# Patient Record
Sex: Female | Born: 1950 | Race: White | Hispanic: No | Marital: Married | Smoking: Former smoker
Health system: Southern US, Community
[De-identification: ages and names within clinical notes are randomized; demographics above are authoritative.]

## PROBLEM LIST (undated history)

## (undated) DIAGNOSIS — J189 Pneumonia, unspecified organism: Secondary | ICD-10-CM

## (undated) DIAGNOSIS — J449 Chronic obstructive pulmonary disease, unspecified: Secondary | ICD-10-CM

## (undated) DIAGNOSIS — E7800 Pure hypercholesterolemia, unspecified: Secondary | ICD-10-CM

---

## 1998-02-27 ENCOUNTER — Other Ambulatory Visit: Admission: RE | Admit: 1998-02-27 | Discharge: 1998-02-27 | Payer: Self-pay | Admitting: Obstetrics & Gynecology

## 1999-06-13 ENCOUNTER — Other Ambulatory Visit: Admission: RE | Admit: 1999-06-13 | Discharge: 1999-06-13 | Payer: Self-pay | Admitting: Obstetrics & Gynecology

## 2000-07-28 ENCOUNTER — Other Ambulatory Visit: Admission: RE | Admit: 2000-07-28 | Discharge: 2000-07-28 | Payer: Self-pay | Admitting: Obstetrics & Gynecology

## 2001-08-03 ENCOUNTER — Other Ambulatory Visit: Admission: RE | Admit: 2001-08-03 | Discharge: 2001-08-03 | Payer: Self-pay | Admitting: Obstetrics & Gynecology

## 2002-11-27 ENCOUNTER — Encounter: Payer: Self-pay | Admitting: Family Medicine

## 2002-11-27 ENCOUNTER — Encounter: Admission: RE | Admit: 2002-11-27 | Discharge: 2002-11-27 | Payer: Self-pay | Admitting: Family Medicine

## 2003-01-07 ENCOUNTER — Emergency Department (HOSPITAL_COMMUNITY): Admission: EM | Admit: 2003-01-07 | Discharge: 2003-01-07 | Payer: Self-pay

## 2003-02-26 ENCOUNTER — Other Ambulatory Visit: Admission: RE | Admit: 2003-02-26 | Discharge: 2003-02-26 | Payer: Self-pay | Admitting: Obstetrics & Gynecology

## 2004-09-05 ENCOUNTER — Other Ambulatory Visit: Admission: RE | Admit: 2004-09-05 | Discharge: 2004-09-05 | Payer: Self-pay | Admitting: Obstetrics & Gynecology

## 2005-01-02 ENCOUNTER — Ambulatory Visit: Payer: Self-pay | Admitting: Internal Medicine

## 2006-05-10 ENCOUNTER — Emergency Department (HOSPITAL_COMMUNITY): Admission: EM | Admit: 2006-05-10 | Discharge: 2006-05-10 | Payer: Self-pay | Admitting: Emergency Medicine

## 2006-05-13 ENCOUNTER — Ambulatory Visit: Payer: Self-pay | Admitting: Internal Medicine

## 2007-02-01 ENCOUNTER — Emergency Department (HOSPITAL_COMMUNITY): Admission: EM | Admit: 2007-02-01 | Discharge: 2007-02-01 | Payer: Self-pay | Admitting: Emergency Medicine

## 2011-12-30 ENCOUNTER — Encounter (INDEPENDENT_AMBULATORY_CARE_PROVIDER_SITE_OTHER): Payer: BC Managed Care – PPO | Admitting: *Deleted

## 2011-12-30 DIAGNOSIS — M79609 Pain in unspecified limb: Secondary | ICD-10-CM

## 2012-12-28 ENCOUNTER — Other Ambulatory Visit: Payer: Self-pay | Admitting: Orthopedic Surgery

## 2012-12-28 DIAGNOSIS — M8580 Other specified disorders of bone density and structure, unspecified site: Secondary | ICD-10-CM

## 2013-01-06 ENCOUNTER — Ambulatory Visit
Admission: RE | Admit: 2013-01-06 | Discharge: 2013-01-06 | Disposition: A | Discharge status: Home / Self Care | Payer: BC Managed Care – PPO | Source: Ambulatory Visit | Attending: Orthopedic Surgery | Admitting: Orthopedic Surgery

## 2013-01-06 DIAGNOSIS — M8580 Other specified disorders of bone density and structure, unspecified site: Secondary | ICD-10-CM

## 2014-07-31 ENCOUNTER — Other Ambulatory Visit: Payer: Self-pay | Admitting: Family Medicine

## 2014-07-31 ENCOUNTER — Ambulatory Visit
Admission: RE | Admit: 2014-07-31 | Discharge: 2014-07-31 | Disposition: A | Discharge status: Home / Self Care | Payer: BC Managed Care – PPO | Source: Ambulatory Visit | Attending: Family Medicine | Admitting: Family Medicine

## 2014-07-31 DIAGNOSIS — R52 Pain, unspecified: Secondary | ICD-10-CM

## 2014-07-31 DIAGNOSIS — M25551 Pain in right hip: Secondary | ICD-10-CM

## 2017-05-06 DIAGNOSIS — L989 Disorder of the skin and subcutaneous tissue, unspecified: Secondary | ICD-10-CM | POA: Diagnosis not present

## 2017-05-06 DIAGNOSIS — R8290 Unspecified abnormal findings in urine: Secondary | ICD-10-CM | POA: Diagnosis not present

## 2017-05-06 DIAGNOSIS — Z86010 Personal history of colonic polyps: Secondary | ICD-10-CM | POA: Diagnosis not present

## 2017-05-06 DIAGNOSIS — Z79899 Other long term (current) drug therapy: Secondary | ICD-10-CM | POA: Diagnosis not present

## 2017-05-06 DIAGNOSIS — R7301 Impaired fasting glucose: Secondary | ICD-10-CM | POA: Diagnosis not present

## 2017-05-06 DIAGNOSIS — Z23 Encounter for immunization: Secondary | ICD-10-CM | POA: Diagnosis not present

## 2017-05-06 DIAGNOSIS — E559 Vitamin D deficiency, unspecified: Secondary | ICD-10-CM | POA: Diagnosis not present

## 2017-05-06 DIAGNOSIS — J449 Chronic obstructive pulmonary disease, unspecified: Secondary | ICD-10-CM | POA: Diagnosis not present

## 2017-05-06 DIAGNOSIS — R918 Other nonspecific abnormal finding of lung field: Secondary | ICD-10-CM | POA: Diagnosis not present

## 2017-05-06 DIAGNOSIS — M810 Age-related osteoporosis without current pathological fracture: Secondary | ICD-10-CM | POA: Diagnosis not present

## 2017-05-06 DIAGNOSIS — Z0000 Encounter for general adult medical examination without abnormal findings: Secondary | ICD-10-CM | POA: Diagnosis not present

## 2017-05-06 DIAGNOSIS — E785 Hyperlipidemia, unspecified: Secondary | ICD-10-CM | POA: Diagnosis not present

## 2017-07-09 DIAGNOSIS — L719 Rosacea, unspecified: Secondary | ICD-10-CM | POA: Diagnosis not present

## 2017-07-09 DIAGNOSIS — L821 Other seborrheic keratosis: Secondary | ICD-10-CM | POA: Diagnosis not present

## 2017-07-09 DIAGNOSIS — D2230 Melanocytic nevi of unspecified part of face: Secondary | ICD-10-CM | POA: Diagnosis not present

## 2017-07-09 DIAGNOSIS — D485 Neoplasm of uncertain behavior of skin: Secondary | ICD-10-CM | POA: Diagnosis not present

## 2017-07-09 DIAGNOSIS — Q828 Other specified congenital malformations of skin: Secondary | ICD-10-CM | POA: Diagnosis not present

## 2017-07-09 DIAGNOSIS — D1800 Hemangioma unspecified site: Secondary | ICD-10-CM | POA: Diagnosis not present

## 2017-07-09 DIAGNOSIS — D216 Benign neoplasm of connective and other soft tissue of trunk, unspecified: Secondary | ICD-10-CM | POA: Diagnosis not present

## 2017-07-09 DIAGNOSIS — L820 Inflamed seborrheic keratosis: Secondary | ICD-10-CM | POA: Diagnosis not present

## 2017-07-09 DIAGNOSIS — D220 Melanocytic nevi of lip: Secondary | ICD-10-CM | POA: Diagnosis not present

## 2017-07-09 DIAGNOSIS — Z23 Encounter for immunization: Secondary | ICD-10-CM | POA: Diagnosis not present

## 2017-09-07 DIAGNOSIS — L570 Actinic keratosis: Secondary | ICD-10-CM | POA: Diagnosis not present

## 2017-09-07 DIAGNOSIS — Z23 Encounter for immunization: Secondary | ICD-10-CM | POA: Diagnosis not present

## 2017-09-07 DIAGNOSIS — L821 Other seborrheic keratosis: Secondary | ICD-10-CM | POA: Diagnosis not present

## 2017-09-07 DIAGNOSIS — L719 Rosacea, unspecified: Secondary | ICD-10-CM | POA: Diagnosis not present

## 2017-12-09 DIAGNOSIS — Z23 Encounter for immunization: Secondary | ICD-10-CM | POA: Diagnosis not present

## 2017-12-09 DIAGNOSIS — D220 Melanocytic nevi of lip: Secondary | ICD-10-CM | POA: Diagnosis not present

## 2017-12-09 DIAGNOSIS — D2371 Other benign neoplasm of skin of right lower limb, including hip: Secondary | ICD-10-CM | POA: Diagnosis not present

## 2017-12-09 DIAGNOSIS — L570 Actinic keratosis: Secondary | ICD-10-CM | POA: Diagnosis not present

## 2017-12-09 DIAGNOSIS — D2271 Melanocytic nevi of right lower limb, including hip: Secondary | ICD-10-CM | POA: Diagnosis not present

## 2017-12-09 DIAGNOSIS — Q828 Other specified congenital malformations of skin: Secondary | ICD-10-CM | POA: Diagnosis not present

## 2017-12-09 DIAGNOSIS — D2230 Melanocytic nevi of unspecified part of face: Secondary | ICD-10-CM | POA: Diagnosis not present

## 2018-05-27 DIAGNOSIS — J449 Chronic obstructive pulmonary disease, unspecified: Secondary | ICD-10-CM | POA: Diagnosis not present

## 2018-05-27 DIAGNOSIS — F17200 Nicotine dependence, unspecified, uncomplicated: Secondary | ICD-10-CM | POA: Diagnosis not present

## 2018-05-27 DIAGNOSIS — Z79899 Other long term (current) drug therapy: Secondary | ICD-10-CM | POA: Diagnosis not present

## 2018-05-27 DIAGNOSIS — Z86010 Personal history of colonic polyps: Secondary | ICD-10-CM | POA: Diagnosis not present

## 2018-05-27 DIAGNOSIS — R7301 Impaired fasting glucose: Secondary | ICD-10-CM | POA: Diagnosis not present

## 2018-05-27 DIAGNOSIS — R918 Other nonspecific abnormal finding of lung field: Secondary | ICD-10-CM | POA: Diagnosis not present

## 2018-05-27 DIAGNOSIS — M810 Age-related osteoporosis without current pathological fracture: Secondary | ICD-10-CM | POA: Diagnosis not present

## 2018-05-27 DIAGNOSIS — Z0000 Encounter for general adult medical examination without abnormal findings: Secondary | ICD-10-CM | POA: Diagnosis not present

## 2018-05-27 DIAGNOSIS — Z1389 Encounter for screening for other disorder: Secondary | ICD-10-CM | POA: Diagnosis not present

## 2018-05-27 DIAGNOSIS — E785 Hyperlipidemia, unspecified: Secondary | ICD-10-CM | POA: Diagnosis not present

## 2018-05-27 DIAGNOSIS — R8290 Unspecified abnormal findings in urine: Secondary | ICD-10-CM | POA: Diagnosis not present

## 2018-05-27 DIAGNOSIS — M899 Disorder of bone, unspecified: Secondary | ICD-10-CM | POA: Diagnosis not present

## 2018-05-27 DIAGNOSIS — R5383 Other fatigue: Secondary | ICD-10-CM | POA: Diagnosis not present

## 2018-05-27 DIAGNOSIS — Z23 Encounter for immunization: Secondary | ICD-10-CM | POA: Diagnosis not present

## 2018-06-03 ENCOUNTER — Other Ambulatory Visit: Payer: Self-pay | Admitting: Family Medicine

## 2018-06-03 DIAGNOSIS — R918 Other nonspecific abnormal finding of lung field: Secondary | ICD-10-CM

## 2018-06-10 ENCOUNTER — Ambulatory Visit
Admission: RE | Admit: 2018-06-10 | Discharge: 2018-06-10 | Disposition: A | Discharge status: Home / Self Care | Payer: Medicare Other | Source: Ambulatory Visit | Attending: Family Medicine | Admitting: Family Medicine

## 2018-06-10 DIAGNOSIS — R918 Other nonspecific abnormal finding of lung field: Secondary | ICD-10-CM | POA: Diagnosis not present

## 2018-06-10 MED ORDER — IOPAMIDOL (ISOVUE-300) INJECTION 61%
75.0000 mL | Freq: Once | INTRAVENOUS | Status: AC | PRN
  Administered 2018-06-10: 75 mL via INTRAVENOUS

## 2018-06-24 DIAGNOSIS — J449 Chronic obstructive pulmonary disease, unspecified: Secondary | ICD-10-CM | POA: Diagnosis not present

## 2018-06-24 DIAGNOSIS — M810 Age-related osteoporosis without current pathological fracture: Secondary | ICD-10-CM | POA: Diagnosis not present

## 2018-07-14 ENCOUNTER — Telehealth: Payer: Self-pay | Admitting: Neurology

## 2018-07-14 NOTE — Telephone Encounter (Signed)
Error

## 2018-08-18 DIAGNOSIS — M810 Age-related osteoporosis without current pathological fracture: Secondary | ICD-10-CM | POA: Diagnosis not present

## 2018-09-02 DIAGNOSIS — R000 Tachycardia, unspecified: Secondary | ICD-10-CM | POA: Diagnosis not present

## 2018-09-02 DIAGNOSIS — E569 Vitamin deficiency, unspecified: Secondary | ICD-10-CM | POA: Diagnosis not present

## 2018-09-27 ENCOUNTER — Ambulatory Visit (INDEPENDENT_AMBULATORY_CARE_PROVIDER_SITE_OTHER): Payer: Medicare Other | Admitting: Cardiovascular Disease

## 2018-09-27 ENCOUNTER — Encounter: Payer: Self-pay | Admitting: Cardiovascular Disease

## 2018-09-27 DIAGNOSIS — R002 Palpitations: Secondary | ICD-10-CM | POA: Diagnosis not present

## 2018-09-27 DIAGNOSIS — Z720 Tobacco use: Secondary | ICD-10-CM | POA: Diagnosis not present

## 2018-09-27 DIAGNOSIS — J410 Simple chronic bronchitis: Secondary | ICD-10-CM | POA: Diagnosis not present

## 2018-09-27 DIAGNOSIS — E782 Mixed hyperlipidemia: Secondary | ICD-10-CM | POA: Diagnosis not present

## 2018-09-27 DIAGNOSIS — E785 Hyperlipidemia, unspecified: Secondary | ICD-10-CM | POA: Insufficient documentation

## 2018-09-27 DIAGNOSIS — J449 Chronic obstructive pulmonary disease, unspecified: Secondary | ICD-10-CM | POA: Insufficient documentation

## 2018-09-27 NOTE — Assessment & Plan Note (Signed)
Ms. Oswald was referred to me by Jillyn Ledger FNP for tachypalpitations.  She had the first episode 3 months ago and one subsequent episode since that time lasting 5 to 10 minutes without other associated symptoms.  She does drink 2 to 3 cups of coffee a day.  I am going get a 2D echo and a 30-day event monitor to further evaluate.

## 2018-09-27 NOTE — Addendum Note (Signed)
Addended by: Annita Brod on: 09/27/2018 02:58 PM   Modules accepted: Orders

## 2018-09-27 NOTE — Progress Notes (Signed)
09/27/2018 Jordan Clay   May 28, 1951  [id number]  Primary Physician Hulan Fess, MD Primary Cardiologist: Lorretta Harp MD Lupe Carney, [place]  HPI:  Jordan Clay is a 67 y.o. thin appearing married Caucasian female mother of 2, grandmother for grandchildren whose husband Obie Dredge was also a patient of mine.  She was referred to me by Jillyn Ledger FNP for tachypalpitations.  Is a history of treated hyper lipidemia.  Is no family history other than a father who died at a late age of an myocardial infarction.  She does smoke three quarters a pack a day for last 45 years recalcitrant risk factor modification.  She never had a heart attack or stroke.  She said 2 episodes of tachypalpitations, the first being 3 months ago and one subsequent to that lasting 5 to 10 minutes at a time without associated symptoms.  She does drink 2 to 3 cups of caffeine a day.   No outpatient medications have been marked as taking for the 09/27/18 encounter (Office Visit) with Lorretta Harp, MD.     Not on File  Social History   Socioeconomic History  . Marital status: Married    Spouse name: Not on file  . Number of children: Not on file  . Years of education: Not on file  . Highest education level: Not on file  Occupational History  . Not on file  Social Needs  . Financial resource strain: Not on file  . Food insecurity:    Worry: Not on file    Inability: Not on file  . Transportation needs:    Medical: Not on file    Non-medical: Not on file  Tobacco Use  . Smoking status: Current Every Day Smoker  . Smokeless tobacco: Never Used  Substance and Sexual Activity  . Alcohol use: Not on file  . Drug use: Not on file  . Sexual activity: Not on file  Lifestyle  . Physical activity:    Days per week: Not on file    Minutes per session: Not on file  . Stress: Not on file  Relationships  . Social connections:    Talks on phone: Not on file    Gets together: Not on file   Attends religious service: Not on file    Active member of club or organization: Not on file    Attends meetings of clubs or organizations: Not on file    Relationship status: Not on file  . Intimate partner violence:    Fear of current or ex partner: Not on file    Emotionally abused: Not on file    Physically abused: Not on file    Forced sexual activity: Not on file  Other Topics Concern  . Not on file  Social History Narrative  . Not on file     Review of Systems: General: negative for chills, fever, night sweats or weight changes.  Cardiovascular: negative for chest pain, dyspnea on exertion, edema, orthopnea, palpitations, paroxysmal nocturnal dyspnea or shortness of breath Dermatological: negative for rash Respiratory: negative for cough or wheezing Urologic: negative for hematuria Abdominal: negative for nausea, vomiting, diarrhea, bright red blood per rectum, melena, or hematemesis Neurologic: negative for visual changes, syncope, or dizziness All other systems reviewed and are otherwise negative except as noted above.    Blood pressure 118/62, pulse 70, height 5' 8.5" (1.74 m), weight 148 lb 12.8 oz (67.5 kg).  General appearance: alert and no distress Neck:  no adenopathy, no carotid bruit, no JVD, supple, symmetrical, trachea midline and thyroid not enlarged, symmetric, no tenderness/mass/nodules Lungs: clear to auscultation bilaterally Heart: regular rate and rhythm, S1, S2 normal, no murmur, click, rub or gallop Extremities: extremities normal, atraumatic, no cyanosis or edema Pulses: 2+ and symmetric Skin: Skin color, texture, turgor normal. No rashes or lesions Neurologic: Alert and oriented X 3, normal strength and tone. Normal symmetric reflexes. Normal coordination and gait  EKG sinus rhythm at 70 without ST or T wave changes.  I Personally reviewed this EKG.  ASSESSMENT AND PLAN:   Hyperlipidemia History of hyperlipidemia on statin therapy with a lipid  profile performed 05/27/2018 revealing total cholesterol 161, LDL 92 and HDL 51  Tobacco abuse History of 40-pack-year tobacco abuse currently smoking three quarters of a pack a day recalcitrant risk factor modification.  Palpitations Ms. Pohlman was referred to me by Jillyn Ledger FNP for tachypalpitations.  She had the first episode 3 months ago and one subsequent episode since that time lasting 5 to 10 minutes without other associated symptoms.  She does drink 2 to 3 cups of coffee a day.  I am going get a 2D echo and a 30-day event monitor to further evaluate.      Lorretta Harp MD FACP,FACC,FAHA, [facility] 09/27/2018 2:53 PM

## 2018-09-27 NOTE — Assessment & Plan Note (Signed)
History of hyperlipidemia on statin therapy with a lipid profile performed 05/27/2018 revealing total cholesterol 161, LDL 92 and HDL 51

## 2018-09-27 NOTE — Patient Instructions (Signed)
Medication Instructions:  NONE If you need a refill on your cardiac medications before your next appointment, please call your pharmacy.   Lab work: NONE If you have labs (blood work) drawn today and your tests are completely normal, you will receive your results only by: Marland Kitchen MyChart Message (if you have MyChart) OR . A paper copy in the mail If you have any lab test that is abnormal or we need to change your treatment, we will call you to review the results.  Testing/Procedures: Your physician has requested that you have an echocardiogram. Echocardiography is a painless test that uses sound waves to create images of your heart. It provides your doctor with information about the size and shape of your heart and how well your heart's chambers and valves are working. This procedure takes approximately one hour. There are no restrictions for this procedure.  Your physician has recommended that you wear a 30-DAY event monitor. Event monitors are medical devices that record the heart's electrical activity. Doctors most often [place] these monitors to diagnose arrhythmias. Arrhythmias are problems with the speed or rhythm of the heartbeat. The monitor is a small, portable device. You can wear one while you do your normal daily activities. This is usually used to diagnose what is causing palpitations/syncope (passing out).   Follow-Up: At [facility], you and your health needs are our priority.  As part of our continuing mission to provide you with exceptional heart care, we have created designated Provider Care Teams.  These Care Teams include your primary Cardiologist (physician) and Advanced Practice Providers (APPs -  Physician Assistants and Nurse Practitioners) who all work together to provide you with the care you need, when you need it.  You will need a follow up appointment in 3 months WITH DR. Gwenlyn Found.

## 2018-09-27 NOTE — Assessment & Plan Note (Signed)
History of 40-pack-year tobacco abuse currently smoking three quarters of a pack a day recalcitrant risk factor modification.

## 2018-10-05 ENCOUNTER — Ambulatory Visit (INDEPENDENT_AMBULATORY_CARE_PROVIDER_SITE_OTHER): Payer: Medicare Other

## 2018-10-05 ENCOUNTER — Other Ambulatory Visit: Payer: Self-pay

## 2018-10-05 ENCOUNTER — Ambulatory Visit (HOSPITAL_COMMUNITY): Payer: Medicare Other | Attending: Cardiology

## 2018-10-05 DIAGNOSIS — I5189 Other ill-defined heart diseases: Secondary | ICD-10-CM | POA: Insufficient documentation

## 2018-10-05 DIAGNOSIS — F17200 Nicotine dependence, unspecified, uncomplicated: Secondary | ICD-10-CM | POA: Insufficient documentation

## 2018-10-05 DIAGNOSIS — Z8249 Family history of ischemic heart disease and other diseases of the circulatory system: Secondary | ICD-10-CM | POA: Diagnosis not present

## 2018-10-05 DIAGNOSIS — R002 Palpitations: Secondary | ICD-10-CM | POA: Diagnosis not present

## 2018-10-05 DIAGNOSIS — E785 Hyperlipidemia, unspecified: Secondary | ICD-10-CM | POA: Diagnosis not present

## 2018-10-10 ENCOUNTER — Other Ambulatory Visit (HOSPITAL_COMMUNITY): Payer: Medicare Other

## 2018-12-12 DIAGNOSIS — H52201 Unspecified astigmatism, right eye: Secondary | ICD-10-CM | POA: Diagnosis not present

## 2018-12-12 DIAGNOSIS — H25813 Combined forms of age-related cataract, bilateral: Secondary | ICD-10-CM | POA: Diagnosis not present

## 2018-12-12 DIAGNOSIS — H524 Presbyopia: Secondary | ICD-10-CM | POA: Diagnosis not present

## 2018-12-16 DIAGNOSIS — M79605 Pain in left leg: Secondary | ICD-10-CM | POA: Diagnosis not present

## 2019-01-13 ENCOUNTER — Ambulatory Visit: Payer: Medicare Other | Admitting: Cardiovascular Disease

## 2019-06-07 DIAGNOSIS — R1013 Epigastric pain: Secondary | ICD-10-CM | POA: Diagnosis not present

## 2019-07-31 ENCOUNTER — Other Ambulatory Visit: Payer: Self-pay

## 2019-07-31 DIAGNOSIS — R05 Cough: Secondary | ICD-10-CM | POA: Diagnosis not present

## 2019-07-31 DIAGNOSIS — Z87891 Personal history of nicotine dependence: Secondary | ICD-10-CM | POA: Diagnosis not present

## 2019-07-31 DIAGNOSIS — Z20822 Contact with and (suspected) exposure to covid-19: Secondary | ICD-10-CM

## 2019-07-31 DIAGNOSIS — R0781 Pleurodynia: Secondary | ICD-10-CM | POA: Diagnosis not present

## 2019-07-31 DIAGNOSIS — J449 Chronic obstructive pulmonary disease, unspecified: Secondary | ICD-10-CM | POA: Diagnosis not present

## 2019-08-01 LAB — NOVEL CORONAVIRUS, NAA: SARS-CoV-2, NAA: NOT DETECTED

## 2019-08-04 DIAGNOSIS — R05 Cough: Secondary | ICD-10-CM | POA: Diagnosis not present

## 2019-08-07 ENCOUNTER — Encounter (HOSPITAL_COMMUNITY): Payer: Self-pay

## 2019-08-07 ENCOUNTER — Emergency Department (HOSPITAL_COMMUNITY): Payer: Medicare Other

## 2019-08-07 ENCOUNTER — Other Ambulatory Visit: Payer: Self-pay

## 2019-08-07 ENCOUNTER — Inpatient Hospital Stay (HOSPITAL_COMMUNITY)
Admission: EM | Admit: 2019-08-07 | Discharge: 2019-08-31 | DRG: 166 | Disposition: A | Discharge status: Hospice | Payer: Medicare Other | Attending: Internal Medicine | Admitting: Internal Medicine

## 2019-08-07 DIAGNOSIS — R918 Other nonspecific abnormal finding of lung field: Secondary | ICD-10-CM | POA: Diagnosis not present

## 2019-08-07 DIAGNOSIS — J439 Emphysema, unspecified: Secondary | ICD-10-CM | POA: Diagnosis present

## 2019-08-07 DIAGNOSIS — R9431 Abnormal electrocardiogram [ECG] [EKG]: Secondary | ICD-10-CM | POA: Diagnosis present

## 2019-08-07 DIAGNOSIS — R059 Cough, unspecified: Secondary | ICD-10-CM

## 2019-08-07 DIAGNOSIS — R000 Tachycardia, unspecified: Secondary | ICD-10-CM | POA: Diagnosis not present

## 2019-08-07 DIAGNOSIS — C771 Secondary and unspecified malignant neoplasm of intrathoracic lymph nodes: Secondary | ICD-10-CM | POA: Diagnosis present

## 2019-08-07 DIAGNOSIS — Y92230 Patient room in hospital as the place of occurrence of the external cause: Secondary | ICD-10-CM | POA: Diagnosis not present

## 2019-08-07 DIAGNOSIS — J9600 Acute respiratory failure, unspecified whether with hypoxia or hypercapnia: Secondary | ICD-10-CM | POA: Diagnosis not present

## 2019-08-07 DIAGNOSIS — F17211 Nicotine dependence, cigarettes, in remission: Secondary | ICD-10-CM | POA: Diagnosis not present

## 2019-08-07 DIAGNOSIS — Z20828 Contact with and (suspected) exposure to other viral communicable diseases: Secondary | ICD-10-CM | POA: Diagnosis present

## 2019-08-07 DIAGNOSIS — G4700 Insomnia, unspecified: Secondary | ICD-10-CM | POA: Diagnosis present

## 2019-08-07 DIAGNOSIS — J90 Pleural effusion, not elsewhere classified: Secondary | ICD-10-CM | POA: Diagnosis present

## 2019-08-07 DIAGNOSIS — J189 Pneumonia, unspecified organism: Secondary | ICD-10-CM | POA: Diagnosis present

## 2019-08-07 DIAGNOSIS — Z4682 Encounter for fitting and adjustment of non-vascular catheter: Secondary | ICD-10-CM | POA: Diagnosis not present

## 2019-08-07 DIAGNOSIS — C3431 Malignant neoplasm of lower lobe, right bronchus or lung: Principal | ICD-10-CM | POA: Diagnosis present

## 2019-08-07 DIAGNOSIS — I4581 Long QT syndrome: Secondary | ICD-10-CM | POA: Diagnosis present

## 2019-08-07 DIAGNOSIS — Z9689 Presence of other specified functional implants: Secondary | ICD-10-CM | POA: Diagnosis not present

## 2019-08-07 DIAGNOSIS — T380X5A Adverse effect of glucocorticoids and synthetic analogues, initial encounter: Secondary | ICD-10-CM | POA: Diagnosis not present

## 2019-08-07 DIAGNOSIS — R739 Hyperglycemia, unspecified: Secondary | ICD-10-CM | POA: Diagnosis not present

## 2019-08-07 DIAGNOSIS — R05 Cough: Secondary | ICD-10-CM | POA: Diagnosis not present

## 2019-08-07 DIAGNOSIS — R0602 Shortness of breath: Secondary | ICD-10-CM | POA: Diagnosis not present

## 2019-08-07 DIAGNOSIS — C3490 Malignant neoplasm of unspecified part of unspecified bronchus or lung: Secondary | ICD-10-CM | POA: Diagnosis not present

## 2019-08-07 DIAGNOSIS — Z882 Allergy status to sulfonamides status: Secondary | ICD-10-CM

## 2019-08-07 DIAGNOSIS — J181 Lobar pneumonia, unspecified organism: Secondary | ICD-10-CM | POA: Diagnosis not present

## 2019-08-07 DIAGNOSIS — Z515 Encounter for palliative care: Secondary | ICD-10-CM | POA: Diagnosis not present

## 2019-08-07 DIAGNOSIS — J9601 Acute respiratory failure with hypoxia: Secondary | ICD-10-CM | POA: Diagnosis present

## 2019-08-07 DIAGNOSIS — Z9889 Other specified postprocedural states: Secondary | ICD-10-CM

## 2019-08-07 DIAGNOSIS — F05 Delirium due to known physiological condition: Secondary | ICD-10-CM | POA: Diagnosis not present

## 2019-08-07 DIAGNOSIS — C3491 Malignant neoplasm of unspecified part of right bronchus or lung: Secondary | ICD-10-CM | POA: Diagnosis not present

## 2019-08-07 DIAGNOSIS — J449 Chronic obstructive pulmonary disease, unspecified: Secondary | ICD-10-CM | POA: Diagnosis not present

## 2019-08-07 DIAGNOSIS — Z9981 Dependence on supplemental oxygen: Secondary | ICD-10-CM | POA: Diagnosis not present

## 2019-08-07 DIAGNOSIS — J910 Malignant pleural effusion: Secondary | ICD-10-CM | POA: Diagnosis present

## 2019-08-07 DIAGNOSIS — F418 Other specified anxiety disorders: Secondary | ICD-10-CM | POA: Diagnosis not present

## 2019-08-07 DIAGNOSIS — J984 Other disorders of lung: Secondary | ICD-10-CM | POA: Diagnosis not present

## 2019-08-07 DIAGNOSIS — K5900 Constipation, unspecified: Secondary | ICD-10-CM | POA: Diagnosis present

## 2019-08-07 DIAGNOSIS — E440 Moderate protein-calorie malnutrition: Secondary | ICD-10-CM | POA: Diagnosis not present

## 2019-08-07 DIAGNOSIS — I9589 Other hypotension: Secondary | ICD-10-CM | POA: Diagnosis present

## 2019-08-07 DIAGNOSIS — R404 Transient alteration of awareness: Secondary | ICD-10-CM | POA: Diagnosis not present

## 2019-08-07 DIAGNOSIS — J42 Unspecified chronic bronchitis: Secondary | ICD-10-CM | POA: Diagnosis not present

## 2019-08-07 DIAGNOSIS — Z7189 Other specified counseling: Secondary | ICD-10-CM

## 2019-08-07 DIAGNOSIS — K219 Gastro-esophageal reflux disease without esophagitis: Secondary | ICD-10-CM | POA: Diagnosis present

## 2019-08-07 DIAGNOSIS — C778 Secondary and unspecified malignant neoplasm of lymph nodes of multiple regions: Secondary | ICD-10-CM | POA: Diagnosis not present

## 2019-08-07 DIAGNOSIS — J9690 Respiratory failure, unspecified, unspecified whether with hypoxia or hypercapnia: Secondary | ICD-10-CM | POA: Diagnosis not present

## 2019-08-07 DIAGNOSIS — Z66 Do not resuscitate: Secondary | ICD-10-CM | POA: Diagnosis not present

## 2019-08-07 DIAGNOSIS — R1310 Dysphagia, unspecified: Secondary | ICD-10-CM | POA: Diagnosis not present

## 2019-08-07 DIAGNOSIS — F419 Anxiety disorder, unspecified: Secondary | ICD-10-CM | POA: Diagnosis not present

## 2019-08-07 DIAGNOSIS — J939 Pneumothorax, unspecified: Secondary | ICD-10-CM | POA: Diagnosis not present

## 2019-08-07 DIAGNOSIS — R9389 Abnormal findings on diagnostic imaging of other specified body structures: Secondary | ICD-10-CM | POA: Diagnosis not present

## 2019-08-07 DIAGNOSIS — C3481 Malignant neoplasm of overlapping sites of right bronchus and lung: Secondary | ICD-10-CM | POA: Diagnosis not present

## 2019-08-07 DIAGNOSIS — Z6825 Body mass index (BMI) 25.0-25.9, adult: Secondary | ICD-10-CM

## 2019-08-07 DIAGNOSIS — C799 Secondary malignant neoplasm of unspecified site: Secondary | ICD-10-CM | POA: Diagnosis not present

## 2019-08-07 DIAGNOSIS — Z79899 Other long term (current) drug therapy: Secondary | ICD-10-CM

## 2019-08-07 DIAGNOSIS — I471 Supraventricular tachycardia: Secondary | ICD-10-CM | POA: Diagnosis not present

## 2019-08-07 DIAGNOSIS — R339 Retention of urine, unspecified: Secondary | ICD-10-CM | POA: Diagnosis not present

## 2019-08-07 DIAGNOSIS — R159 Full incontinence of feces: Secondary | ICD-10-CM | POA: Diagnosis not present

## 2019-08-07 DIAGNOSIS — E785 Hyperlipidemia, unspecified: Secondary | ICD-10-CM | POA: Diagnosis present

## 2019-08-07 DIAGNOSIS — J410 Simple chronic bronchitis: Secondary | ICD-10-CM | POA: Diagnosis not present

## 2019-08-07 DIAGNOSIS — Z7401 Bed confinement status: Secondary | ICD-10-CM | POA: Diagnosis not present

## 2019-08-07 DIAGNOSIS — J441 Chronic obstructive pulmonary disease with (acute) exacerbation: Secondary | ICD-10-CM | POA: Diagnosis not present

## 2019-08-07 DIAGNOSIS — R21 Rash and other nonspecific skin eruption: Secondary | ICD-10-CM | POA: Diagnosis not present

## 2019-08-07 DIAGNOSIS — R64 Cachexia: Secondary | ICD-10-CM | POA: Diagnosis not present

## 2019-08-07 DIAGNOSIS — E43 Unspecified severe protein-calorie malnutrition: Secondary | ICD-10-CM | POA: Diagnosis not present

## 2019-08-07 DIAGNOSIS — Z8701 Personal history of pneumonia (recurrent): Secondary | ICD-10-CM | POA: Diagnosis not present

## 2019-08-07 DIAGNOSIS — Z881 Allergy status to other antibiotic agents status: Secondary | ICD-10-CM

## 2019-08-07 DIAGNOSIS — M2550 Pain in unspecified joint: Secondary | ICD-10-CM | POA: Diagnosis not present

## 2019-08-07 DIAGNOSIS — R0600 Dyspnea, unspecified: Secondary | ICD-10-CM | POA: Diagnosis not present

## 2019-08-07 DIAGNOSIS — F17210 Nicotine dependence, cigarettes, uncomplicated: Secondary | ICD-10-CM | POA: Diagnosis present

## 2019-08-07 DIAGNOSIS — R32 Unspecified urinary incontinence: Secondary | ICD-10-CM | POA: Diagnosis not present

## 2019-08-07 HISTORY — DX: Chronic obstructive pulmonary disease, unspecified: J44.9

## 2019-08-07 HISTORY — DX: Pure hypercholesterolemia, unspecified: E78.00

## 2019-08-07 HISTORY — DX: Pneumonia, unspecified organism: J18.9

## 2019-08-07 LAB — CBC WITH DIFFERENTIAL/PLATELET
Abs Immature Granulocytes: 0.11 10*3/uL — ABNORMAL HIGH (ref 0.00–0.07)
Basophils Absolute: 0.1 10*3/uL (ref 0.0–0.1)
Basophils Relative: 0 %
Eosinophils Absolute: 0.3 10*3/uL (ref 0.0–0.5)
Eosinophils Relative: 2 %
HCT: 45.4 % (ref 36.0–46.0)
Hemoglobin: 14.6 g/dL (ref 12.0–15.0)
Immature Granulocytes: 1 %
Lymphocytes Relative: 12 %
Lymphs Abs: 2.4 10*3/uL (ref 0.7–4.0)
MCH: 30 pg (ref 26.0–34.0)
MCHC: 32.2 g/dL (ref 30.0–36.0)
MCV: 93.2 fL (ref 80.0–100.0)
Monocytes Absolute: 1.3 10*3/uL — ABNORMAL HIGH (ref 0.1–1.0)
Monocytes Relative: 7 %
Neutro Abs: 15.1 10*3/uL — ABNORMAL HIGH (ref 1.7–7.7)
Neutrophils Relative %: 78 %
Platelets: 301 10*3/uL (ref 150–400)
RBC: 4.87 MIL/uL (ref 3.87–5.11)
RDW: 12.2 % (ref 11.5–15.5)
WBC: 19.3 10*3/uL — ABNORMAL HIGH (ref 4.0–10.5)
nRBC: 0 % (ref 0.0–0.2)

## 2019-08-07 LAB — COMPREHENSIVE METABOLIC PANEL
ALT: 29 U/L (ref 0–44)
AST: 23 U/L (ref 15–41)
Albumin: 2.8 g/dL — ABNORMAL LOW (ref 3.5–5.0)
Alkaline Phosphatase: 63 U/L (ref 38–126)
Anion gap: 12 (ref 5–15)
BUN: 15 mg/dL (ref 8–23)
CO2: 21 mmol/L — ABNORMAL LOW (ref 22–32)
Calcium: 8.4 mg/dL — ABNORMAL LOW (ref 8.9–10.3)
Chloride: 103 mmol/L (ref 98–111)
Creatinine, Ser: 0.97 mg/dL (ref 0.44–1.00)
GFR calc Af Amer: >60 mL/min (ref >60)
GFR calc non Af Amer: >60 mL/min (ref >60)
Glucose, Bld: 128 mg/dL — ABNORMAL HIGH (ref 70–99)
Potassium: 3.6 mmol/L (ref 3.5–5.1)
Sodium: 136 mmol/L (ref 135–145)
Total Bilirubin: 0.8 mg/dL (ref 0.3–1.2)
Total Protein: 5.9 g/dL — ABNORMAL LOW (ref 6.5–8.1)

## 2019-08-07 LAB — URINALYSIS, ROUTINE W REFLEX MICROSCOPIC
Bilirubin Urine: NEGATIVE
Glucose, UA: NEGATIVE mg/dL
Ketones, ur: 5 mg/dL — AB
Leukocytes,Ua: NEGATIVE
Nitrite: NEGATIVE
Protein, ur: NEGATIVE mg/dL
Specific Gravity, Urine: 1.016 (ref 1.005–1.030)
pH: 6 (ref 5.0–8.0)

## 2019-08-07 LAB — SARS CORONAVIRUS 2 (TAT 6-24 HRS): SARS Coronavirus 2: NEGATIVE

## 2019-08-07 LAB — LACTIC ACID, PLASMA: Lactic Acid, Venous: 1.8 mmol/L (ref 0.5–1.9)

## 2019-08-07 LAB — STREP PNEUMONIAE URINARY ANTIGEN: Strep Pneumo Urinary Antigen: NEGATIVE

## 2019-08-07 MED ORDER — SODIUM CHLORIDE 0.9 % IV SOLN
1.0000 g | Freq: Once | INTRAVENOUS | Status: AC
  Administered 2019-08-07: 17:00:00 1 g via INTRAVENOUS
  Filled 2019-08-07: qty 10

## 2019-08-07 MED ORDER — MORPHINE SULFATE (PF) 4 MG/ML IV SOLN
4.0000 mg | INTRAVENOUS | Status: DC | PRN
  Administered 2019-08-08 – 2019-08-12 (×14): 4 mg via INTRAVENOUS
  Filled 2019-08-07 (×14): qty 1

## 2019-08-07 MED ORDER — SODIUM CHLORIDE 0.9 % IV SOLN
500.0000 mg | INTRAVENOUS | Status: AC
  Administered 2019-08-08 – 2019-08-12 (×5): 500 mg via INTRAVENOUS
  Filled 2019-08-07 (×5): qty 500

## 2019-08-07 MED ORDER — AZITHROMYCIN 500 MG IV SOLR
500.0000 mg | Freq: Once | INTRAVENOUS | Status: AC
  Administered 2019-08-07: 19:00:00 500 mg via INTRAVENOUS
  Filled 2019-08-07: qty 500

## 2019-08-07 MED ORDER — ZOLPIDEM TARTRATE 5 MG PO TABS
5.0000 mg | ORAL_TABLET | Freq: Every evening | ORAL | Status: DC | PRN
  Administered 2019-08-07 – 2019-08-11 (×3): 5 mg via ORAL
  Filled 2019-08-07 (×5): qty 1

## 2019-08-07 MED ORDER — POTASSIUM CHLORIDE CRYS ER 20 MEQ PO TBCR
20.0000 meq | EXTENDED_RELEASE_TABLET | Freq: Once | ORAL | Status: AC
  Administered 2019-08-07: 20 meq via ORAL
  Filled 2019-08-07: qty 1

## 2019-08-07 MED ORDER — LACTATED RINGERS IV BOLUS
500.0000 mL | Freq: Once | INTRAVENOUS | Status: AC
  Administered 2019-08-07: 500 mL via INTRAVENOUS

## 2019-08-07 MED ORDER — SODIUM CHLORIDE 0.9% FLUSH
3.0000 mL | Freq: Two times a day (BID) | INTRAVENOUS | Status: DC
  Administered 2019-08-07 – 2019-08-31 (×36): 3 mL via INTRAVENOUS

## 2019-08-07 MED ORDER — MAGNESIUM SULFATE IN D5W 1-5 GM/100ML-% IV SOLN
1.0000 g | Freq: Once | INTRAVENOUS | Status: AC
  Administered 2019-08-07: 22:00:00 1 g via INTRAVENOUS
  Filled 2019-08-07: qty 100

## 2019-08-07 MED ORDER — ACETAMINOPHEN 325 MG PO TABS
650.0000 mg | ORAL_TABLET | Freq: Four times a day (QID) | ORAL | Status: DC | PRN
  Administered 2019-08-09 – 2019-08-16 (×3): 650 mg via ORAL
  Filled 2019-08-07 (×4): qty 2

## 2019-08-07 MED ORDER — IOHEXOL 350 MG/ML SOLN
100.0000 mL | Freq: Once | INTRAVENOUS | Status: AC | PRN
  Administered 2019-08-07: 19:00:00 100 mL via INTRAVENOUS

## 2019-08-07 MED ORDER — SODIUM CHLORIDE 0.9 % IV SOLN
2.0000 g | INTRAVENOUS | Status: DC
  Administered 2019-08-08: 2 g via INTRAVENOUS
  Filled 2019-08-07: qty 20
  Filled 2019-08-07: qty 2

## 2019-08-07 MED ORDER — SODIUM CHLORIDE (PF) 0.9 % IJ SOLN
INTRAMUSCULAR | Status: AC
  Filled 2019-08-07: qty 50

## 2019-08-07 MED ORDER — GUAIFENESIN-DM 100-10 MG/5ML PO SYRP
5.0000 mL | ORAL_SOLUTION | ORAL | Status: DC | PRN
  Administered 2019-08-07: 5 mL via ORAL
  Filled 2019-08-07: qty 10

## 2019-08-07 MED ORDER — HYDROCODONE-ACETAMINOPHEN 5-325 MG PO TABS: 1.0000 | ORAL_TABLET | ORAL | Status: DC | PRN

## 2019-08-07 MED ORDER — SODIUM CHLORIDE 0.9 % IV SOLN
INTRAVENOUS | Status: AC
  Administered 2019-08-07: 22:00:00 via INTRAVENOUS

## 2019-08-07 MED ORDER — ACETAMINOPHEN 650 MG RE SUPP: 650.0000 mg | Freq: Four times a day (QID) | RECTAL | Status: DC | PRN

## 2019-08-07 NOTE — H&P (Signed)
History and Physical    Jordan Clay VWU:[id number] DOB: 1951-08-13 DOA: 08/07/2019  PCP: Hulan Fess, MD   Patient coming from: Home   Chief Complaint: Cough, SOB, fatigue   HPI: Jordan Clay is a 68 y.o. female with medical history significant for COPD, presenting to the emergency department for evaluation of cough and shortness of breath despite outpatient antibiotic.  Patient reports that she developed a cough and increased dyspnea approximately 2 weeks ago.  Cough has been mainly nonproductive.  She has not noted any fevers associated with this and denies any chest pain.  She was started on an antibiotic by her PCP recently for pneumonia, developed nausea and vomiting with this, and was then switched to a different antibiotic 2 days ago.  She continues to have a severe cough and dyspnea, has not improved despite antibiotics, and does not know of any sick contacts.  ED Course: Upon arrival to the ED, patient is found to be afebrile, saturating low 90s on room air, tachypneic, tachycardic, and with stable blood pressure.  EKG features ectopic atrial tachycardia with prolonged QT interval.  Chemistry panel is unremarkable and CBC notable for leukocytosis to 19,300.  CTA chest is negative for PE but concerning for obstruction of the right lower lobe bronchus concerning for obstructing mass, near complete consolidation of the right lower lobe, moderate right-sided pleural effusion, and new bulky mediastinal and right hilar lymphadenopathy.  Blood cultures were collected and the patient was treated with 500 cc lactated Ringer's, Rocephin, and azithromycin in the ED.  COVID-19 testing is in process.  Review of Systems:  All other systems reviewed and apart from HPI, are negative.  Past Medical History:  Diagnosis Date   COPD (chronic obstructive pulmonary disease) (HCC)    High cholesterol    Pneumonia     History reviewed. No pertinent surgical history.   reports that she has  been smoking. She has never used smokeless tobacco. She reports that she does not drink alcohol or use drugs.  Allergies  Allergen Reactions   Ciprocin-Fluocin-Procin [Fluocinolone]     History reviewed. No pertinent family history.   Prior to Admission medications   Medication Sig Start Date End Date Taking? Authorizing Provider  atorvastatin (LIPITOR) 20 MG tablet Take 20 mg by mouth daily. 09/03/18   [provider]  VITAMIN D, CHOLECALCIFEROL, PO ergocalciferol (vitamin D2) 1,250 mcg (50,000 unit) capsule    [provider]    Physical Exam: Vitals:   08/07/19 1730 08/07/19 1800 08/07/19 1830 08/07/19 1910  BP: (!) 97/59 103/64 95/66   Pulse: 98 (!) 104 (!) 101   Resp: (!) 25 (!) 26 (!) 23   Temp:    98.9 F (37.2 C)  TempSrc:    Oral  SpO2: 92% 93% 92%   Weight:      Height:        Constitutional: NAD, calm  Eyes: PERTLA, lids and conjunctivae normal ENMT: Mucous membranes are moist. Posterior pharynx clear of any exudate or lesions.   Neck: normal, supple, no masses, no thyromegaly Respiratory: Breath sounds diminished on right. Mild tachypnea. No accessory muscle use.  Cardiovascular: S1 & S2 heard, regular rate and rhythm. No extremity edema.   Abdomen: No distension, no tenderness, soft. Bowel sounds active.  Musculoskeletal: no clubbing / cyanosis. No joint deformity upper and lower extremities.    Skin: no significant rashes, lesions, ulcers. Warm, dry, well-perfused. Neurologic: CN 2-12 grossly intact. Sensation intact. Strength 5/5 in all 4  limbs.  Psychiatric: Alert and oriented x 3. Pleasant, cooperative.    Labs on Admission: I have personally reviewed following labs and imaging studies  CBC: Recent Labs  Lab 08/07/19 1556  WBC 19.3*  NEUTROABS 15.1*  HGB 14.6  HCT 45.4  MCV 93.2  PLT 540   Basic Metabolic Panel: Recent Labs  Lab 08/07/19 1556  NA 136  K 3.6  CL 103  CO2 21*  GLUCOSE 128*  BUN 15  CREATININE 0.97    CALCIUM 8.4*   GFR: Estimated Creatinine Clearance: 54.7 mL/min (by C-G formula based on SCr of 0.97 mg/dL). Liver Function Tests: Recent Labs  Lab 08/07/19 1556  AST 23  ALT 29  ALKPHOS 63  BILITOT 0.8  PROT 5.9*  ALBUMIN 2.8*   No results for input(s): LIPASE, AMYLASE in the last 168 hours. No results for input(s): AMMONIA in the last 168 hours. Coagulation Profile: No results for input(s): INR, PROTIME in the last 168 hours. Cardiac Enzymes: No results for input(s): CKTOTAL, CKMB, CKMBINDEX, TROPONINI in the last 168 hours. BNP (last 3 results) No results for input(s): PROBNP in the last 8760 hours. HbA1C: No results for input(s): HGBA1C in the last 72 hours. CBG: No results for input(s): GLUCAP in the last 168 hours. Lipid Profile: No results for input(s): CHOL, HDL, LDLCALC, TRIG, CHOLHDL, LDLDIRECT in the last 72 hours. Thyroid Function Tests: No results for input(s): TSH, T4TOTAL, FREET4, T3FREE, THYROIDAB in the last 72 hours. Anemia Panel: No results for input(s): VITAMINB12, FOLATE, FERRITIN, TIBC, IRON, RETICCTPCT in the last 72 hours. Urine analysis:    Component Value Date/Time   COLORURINE YELLOW 08/07/2019 1557   APPEARANCEUR HAZY (A) 08/07/2019 1557   LABSPEC 1.016 08/07/2019 1557   PHURINE 6.0 08/07/2019 1557   GLUCOSEU NEGATIVE 08/07/2019 1557   HGBUR SMALL (A) 08/07/2019 1557   BILIRUBINUR NEGATIVE 08/07/2019 1557   KETONESUR 5 (A) 08/07/2019 1557   PROTEINUR NEGATIVE 08/07/2019 1557   NITRITE NEGATIVE 08/07/2019 1557   LEUKOCYTESUR NEGATIVE 08/07/2019 1557   Sepsis Labs: @LABRCNTIP (procalcitonin:4,lacticidven:4) ) Recent Results (from the past 240 hour(s))  Novel Coronavirus, NAA ([facility])     Status: None   Collection Time: 07/31/19 12:00 AM   Specimen: Nasopharyngeal(NP) swabs in vial transport medium   NASOPHARYNGE  TESTING  Result Value Ref Range Status   SARS-CoV-2, NAA Not Detected Not Detected Final    Comment: This nucleic acid  amplification test was developed and its performance characteristics determined by [employer]. Nucleic acid amplification tests include PCR and TMA. This test has not been FDA cleared or approved. This test has been authorized by FDA under an Emergency Use Authorization (EUA). This test is only authorized for the duration of time the declaration that circumstances exist justifying the authorization of the emergency use of in vitro diagnostic tests for detection of SARS-CoV-2 virus and/or diagnosis of COVID-19 infection under section 564(b)(1) of the Act, 21 U.S.C. 086PYP-9(J) (1), unless the authorization is terminated or revoked sooner. When diagnostic testing is negative, the possibility of a false negative result should be considered in the context of a patient's recent exposures and the presence of clinical signs and symptoms consistent with COVID-19. An individual without symptoms of COVID-19 and who is not shedding SARS-CoV-2 virus would  expect to have a negative (not detected) result in this assay.      Radiological Exams on Admission: Ct Angio Chest Pe W/cm &/or Wo Cm  Result Date: 08/07/2019 CLINICAL DATA:  Shortness of breath and  hypoxia. Recent pneumonia. EXAM: CT ANGIOGRAPHY CHEST WITH CONTRAST TECHNIQUE: Multidetector CT imaging of the chest was performed using the standard protocol during bolus administration of intravenous contrast. Multiplanar CT image reconstructions and MIPs were obtained to evaluate the vascular anatomy. CONTRAST:  170mL OMNIPAQUE IOHEXOL 350 MG/ML SOLN COMPARISON:  06/10/2018 FINDINGS: Cardiovascular: Satisfactory opacification of pulmonary arteries noted, and no pulmonary emboli identified. No evidence of thoracic aortic dissection or aneurysm. Aortic atherosclerosis. Mediastinum/Nodes: New bulky lymphadenopathy is seen throughout the right paratracheal and subcarinal regions, with subcarinal soft tissue density measuring up to 4.2 cm short  axis. Right hilar lymphadenopathy is also seen measuring at least 2.2 cm in short axis. Obstruction of the central right lower lobe bronchus and central low-attenuation is suspicious for centrally obstructing mass. Lungs/Pleura: There is near complete right lower lobe consolidation, suspicious for postobstructive pneumonitis. A moderate right pleural effusion is also seen. Several bilateral upper lobe and right middle lobe pulmonary nodules are seen measuring up to 5 mm which are stable. Mild emphysema again noted.33 Upper abdomen: No acute findings. Musculoskeletal: No suspicious bone lesions identified. Review of the MIP images confirms the above findings. IMPRESSION: No evidence of pulmonary embolism. Obstruction of central right lower lobe bronchus and central low-attenuation, suspicious for centrally obstructing mass. Consider further evaluation with bronchoscopy. Near complete right lower lobe consolidation, suspicious for postobstructive pneumonitis. Moderate right pleural effusion. Consider diagnostic thoracentesis. New bulky mediastinal and right hilar lymphadenopathy, consistent with metastatic disease. Stable sub-cm bilateral upper lobe and right middle lobe pulmonary nodules. Aortic Atherosclerosis (ICD10-I70.0) and Emphysema (ICD10-J43.9). Electronically Signed   By: Marlaine Hind M.D.   On: 08/07/2019 19:23   Dg Chest Port 1 View  Result Date: 08/07/2019 CLINICAL DATA:  Worsening short of breath and hypoxia.  COPD. EXAM: PORTABLE CHEST 1 VIEW COMPARISON:  08/04/2019 FINDINGS: Progression of right lower lobe infiltrate compared to the prior study. Possible right pleural effusion has developed. Left lung remains clear. Negative for heart failure or edema. Atherosclerotic aortic arch. IMPRESSION: Progression of right lower lobe infiltrate and probable right pleural effusion. Probable pneumonia. Given the progression, CT chest with contrast may be helpful for further evaluation. Electronically Signed    By: Franchot Gallo M.D.   On: 08/07/2019 15:44    EKG: Independently reviewed. Ectopic atrial tachycardia, rate 114, QTc 543 ms.   Assessment/Plan   1. Right lower lobe bronchus obstruction; pneumonia; pleural effusion  - Presents with SOB and cough, was recently diagnosed with RLL pneumonia but has not improved with antibiotics  - CTA chest in ED features obstruction of RLL bronchus with new bulky adenopathy concerning for mass, pleural effusion, and possible pneumonia  - Blood cultures were collected in ED and she was started on Rocphin and azithromycin  - Check sputum culture, check strep pneumo and legionella antigens, consult IR for thoracentesis with cytology, continue current antibiotics, and follow cultures and clinical course    2. Prolonged QT interval  - QTc interval is 543 ms on admission  - Replace potassium to 4 and mag to 2, minimize QT-prolonging medications, and repeat EKG in am     PPE: Mask, face shield  DVT prophylaxis: SCD's  Code Status: Full  Family Communication: Discussed with patient  Consults called: None  Admission status: Inpatient. Patient has pneumonia with effusion and high PORT score that reflects high risk of mortality and expert recommendation is for inpatient management in these cases.     Vianne Bulls, MD [facility] Pager [phone]  If  7PM-7AM, please contact night-coverage [email or web address] Password [email or web address]  08/07/2019, 7:57 PM

## 2019-08-07 NOTE — ED Notes (Signed)
Also, increased patient's oxygen to 3 L.

## 2019-08-07 NOTE — ED Notes (Signed)
Dr. Rex Kras at bedside, talking with patient.

## 2019-08-07 NOTE — Progress Notes (Signed)
   08/07/19 2052  Vitals  Temp 98.1 F (36.7 C)  Temp Source Oral  BP 97/68  MAP (mmHg) 79  BP Location Right Arm  BP Method Automatic  Patient Position (if appropriate) Lying  Pulse Rate (!) 103  Pulse Rate Source Dinamap  Resp (!) 23  Oxygen Therapy  SpO2 93 %  O2 Device Room Air  MEWS Score  MEWS RR 1  MEWS Pulse 1  MEWS Systolic 1  MEWS LOC 0  MEWS Temp 0  MEWS Score 3  MEWS Score Color Yellow  mews initiated

## 2019-08-07 NOTE — ED Provider Notes (Signed)
[facility] DEPT Provider Note   CSN: [id number] Arrival date & time: 08/07/19  1438     History   Chief Complaint Chief Complaint  Patient presents with  . COPD  . low sats    HPI Jordan Clay is a 68 y.o. female.     68 year old female with history of COPD and hyperlipidemia who presents with shortness of breath and cough.  Patient states that she started getting sick approximately 2 weeks ago when she had increase in cough from baseline, nonproductive.  She has had progressively worsening shortness of breath.  Her PCP saw her a week ago and she had COVID-19 testing which was negative.  She had a chest x-ray that showed pneumonia and was started on an antibiotic, she thinks maybe Levaquin.  She continued to feel very sick and stated that she began having vomiting with the medication as well as pain in her left arm and entire left side.  Her PCP switched her to cefdinir 2 days ago but she has continued to feel sick and short of breath.  She has been using an inhaler every 6 hours as well as a cough medication, steroids, and Mucinex DM.  She denies any pleuritic pain.  No known fevers or sick contacts.  No leg swelling.  No recent travel.  The history is provided by the patient.  COPD    Past Medical History:  Diagnosis Date  . COPD (chronic obstructive pulmonary disease) ([place])   . High cholesterol   . Pneumonia     Patient Active Problem List   Diagnosis Date Noted  . Hyperlipidemia 09/27/2018  . Tobacco abuse 09/27/2018  . COPD (chronic obstructive pulmonary disease) ([place]) 09/27/2018  . Palpitations 09/27/2018    History reviewed. No pertinent surgical history.   OB History   No obstetric history on file.      Home Medications    Prior to Admission medications   Medication Sig Start Date End Date Taking? Authorizing Provider  atorvastatin (LIPITOR) 20 MG tablet Take 20 mg by mouth daily. 09/03/18   [provider]   VITAMIN D, CHOLECALCIFEROL, PO ergocalciferol (vitamin D2) 1,250 mcg (50,000 unit) capsule    [provider]    Family History History reviewed. No pertinent family history.  Social History Social History   Tobacco Use  . Smoking status: Current Every Day Smoker  . Smokeless tobacco: Never Used  Substance Use Topics  . Alcohol use: Never    Frequency: Never  . Drug use: Never     Allergies   Ciprocin-fluocin-procin [fluocinolone]   Review of Systems Review of Systems All other systems reviewed and are negative except that which was mentioned in HPI   Physical Exam Updated Vital Signs BP 93/65   Pulse (!) 119   Temp 98.5 F (36.9 C) (Oral)   Resp 16   Ht 5\' 7"  (1.702 m)   Wt 70.3 kg   SpO2 93%   BMI 24.28 kg/m   Physical Exam Vitals signs and nursing note reviewed.  Constitutional:      General: She is not in acute distress.    Appearance: She is well-developed. She is not toxic-appearing.     Comments: Dyspneic, anxious  HENT:     Head: Normocephalic and atraumatic.  Eyes:     Conjunctiva/sclera: Conjunctivae normal.     Pupils: Pupils are equal, round, and reactive to light.  Neck:     Musculoskeletal: Neck supple.  Cardiovascular:  Rate and Rhythm: Regular rhythm. Tachycardia present.     Heart sounds: Normal heart sounds. No murmur.  Pulmonary:     Comments: tachypneic with mildly increased WOB, no respiratory distress, severely diminished breath sounds RML, RLL Abdominal:     General: Bowel sounds are normal. There is no distension.     Palpations: Abdomen is soft.     Tenderness: There is no abdominal tenderness.  Musculoskeletal:     Right lower leg: No edema.     Left lower leg: No edema.  Skin:    General: Skin is warm and dry.  Neurological:     Mental Status: She is alert and oriented to person, place, and time.     Comments: Fluent speech  Psychiatric:     Comments: anxious      ED Treatments / Results  Labs (all  labs ordered are listed, but only abnormal results are displayed) Labs Reviewed  COMPREHENSIVE METABOLIC PANEL - Abnormal; Notable for the following components:      Result Value   CO2 21 (*)    Glucose, Bld 128 (*)    Calcium 8.4 (*)    Total Protein 5.9 (*)    Albumin 2.8 (*)    All other components within normal limits  CBC WITH DIFFERENTIAL/PLATELET - Abnormal; Notable for the following components:   WBC 19.3 (*)    Neutro Abs 15.1 (*)    Monocytes Absolute 1.3 (*)    Abs Immature Granulocytes 0.11 (*)    All other components within normal limits  URINALYSIS, ROUTINE W REFLEX MICROSCOPIC - Abnormal; Notable for the following components:   APPearance HAZY (*)    Hgb urine dipstick SMALL (*)    Ketones, ur 5 (*)    Bacteria, UA RARE (*)    All other components within normal limits  URINE CULTURE  CULTURE, BLOOD (ROUTINE X 2)  CULTURE, BLOOD (ROUTINE X 2)  SARS CORONAVIRUS 2 (TAT 6-24 HRS)  LACTIC ACID, PLASMA    EKG EKG Interpretation  Date/Time:  Monday August 07 2019 15:01:30 EDT Ventricular Rate:  114 PR Interval:    QRS Duration: 89 QT Interval:  394 QTC Calculation: 543 R Axis:   85 Text Interpretation:  Ectopic atrial tachycardia, unifocal Borderline right axis deviation Prolonged QT interval absent P waves compared to previous Confirmed by Theotis Burrow [phone]) on 08/07/2019 4:36:13 PM   Radiology Ct Angio Chest Pe W/cm &/or Wo Cm  Result Date: 08/07/2019 CLINICAL DATA:  Shortness of breath and hypoxia. Recent pneumonia. EXAM: CT ANGIOGRAPHY CHEST WITH CONTRAST TECHNIQUE: Multidetector CT imaging of the chest was performed using the standard protocol during bolus administration of intravenous contrast. Multiplanar CT image reconstructions and MIPs were obtained to evaluate the vascular anatomy. CONTRAST:  130mL OMNIPAQUE IOHEXOL 350 MG/ML SOLN COMPARISON:  06/10/2018 FINDINGS: Cardiovascular: Satisfactory opacification of pulmonary arteries noted, and no  pulmonary emboli identified. No evidence of thoracic aortic dissection or aneurysm. Aortic atherosclerosis. Mediastinum/Nodes: New bulky lymphadenopathy is seen throughout the right paratracheal and subcarinal regions, with subcarinal soft tissue density measuring up to 4.2 cm short axis. Right hilar lymphadenopathy is also seen measuring at least 2.2 cm in short axis. Obstruction of the central right lower lobe bronchus and central low-attenuation is suspicious for centrally obstructing mass. Lungs/Pleura: There is near complete right lower lobe consolidation, suspicious for postobstructive pneumonitis. A moderate right pleural effusion is also seen. Several bilateral upper lobe and right middle lobe pulmonary nodules are seen measuring up to 5 mm  which are stable. Mild emphysema again noted.33 Upper abdomen: No acute findings. Musculoskeletal: No suspicious bone lesions identified. Review of the MIP images confirms the above findings. IMPRESSION: No evidence of pulmonary embolism. Obstruction of central right lower lobe bronchus and central low-attenuation, suspicious for centrally obstructing mass. Consider further evaluation with bronchoscopy. Near complete right lower lobe consolidation, suspicious for postobstructive pneumonitis. Moderate right pleural effusion. Consider diagnostic thoracentesis. New bulky mediastinal and right hilar lymphadenopathy, consistent with metastatic disease. Stable sub-cm bilateral upper lobe and right middle lobe pulmonary nodules. Aortic Atherosclerosis (ICD10-I70.0) and Emphysema (ICD10-J43.9). Electronically Signed   By: Marlaine Hind M.D.   On: 08/07/2019 19:23   Dg Chest Port 1 View  Result Date: 08/07/2019 CLINICAL DATA:  Worsening short of breath and hypoxia.  COPD. EXAM: PORTABLE CHEST 1 VIEW COMPARISON:  08/04/2019 FINDINGS: Progression of right lower lobe infiltrate compared to the prior study. Possible right pleural effusion has developed. Left lung remains clear.  Negative for heart failure or edema. Atherosclerotic aortic arch. IMPRESSION: Progression of right lower lobe infiltrate and probable right pleural effusion. Probable pneumonia. Given the progression, CT chest with contrast may be helpful for further evaluation. Electronically Signed   By: Franchot Gallo M.D.   On: 08/07/2019 15:44    Procedures Procedures (including critical care time)  Medications Ordered in ED Medications  cefTRIAXone (ROCEPHIN) 1 g in sodium chloride 0.9 % 100 mL IVPB (has no administration in time range)  azithromycin (ZITHROMAX) 500 mg in sodium chloride 0.9 % 250 mL IVPB (has no administration in time range)  lactated ringers bolus 500 mL (has no administration in time range)     Initial Impression / Assessment and Plan / ED Course  I have reviewed the triage vital signs and the nursing notes.  Pertinent labs & imaging results that were available during my care of the patient were reviewed by me and considered in my medical decision making (see chart for details).       She was tachypneic with mildly increased work of breathing but no respiratory distress on arrival, O2 saturation 93 to 95% on room air.Severely diminished breath sounds on the right side.   Because of hypoxia and soft pressures, obtained blood and urine cultures, gave fluid bolus and ceftriaxone and azithromycin.   Chest x-ray shows worsening right lower lobe infiltrate and effusion.  Obtain CT for better evaluation.  Lab work shows normal lactate, normal creatinine, WBC 19.3 which may be partially a result of steroid course.  CTA shows no evidence of PE however patient has potentially an obstructing mass at central right lower lobe bronchus with associated postobstructive pneumonitis and right pleural effusion.  She also has lymphadenopathy concerning with metastatic disease.  I discussed these results with the patient and explained the need for further information.  Discussed admission with [facility]  hospitalist, Dr. Myna Hidalgo, and patient admitted for further work-up and treatment. Final Clinical Impressions(s) / ED Diagnoses   Final diagnoses:  Postobstructive pneumonia  Pulmonary mass    ED Discharge Orders    None       Tamela Elsayed, Wenda Overland, MD 08/07/19 2026

## 2019-08-07 NOTE — ED Triage Notes (Signed)
Patient reports that she checked her pulse ox at home and it was 89% yesterday and today and states she was told to come to the ED if below 90%. Patient states she has had increased SOB and feeling fatigued.  Patient added that she was diagnosed with pneumonia last week.

## 2019-08-07 NOTE — ED Notes (Signed)
Hospitalist is at bedside while giving report to [place], [place] for room [id number].

## 2019-08-08 ENCOUNTER — Inpatient Hospital Stay (HOSPITAL_COMMUNITY): Payer: Medicare Other

## 2019-08-08 DIAGNOSIS — R918 Other nonspecific abnormal finding of lung field: Secondary | ICD-10-CM | POA: Diagnosis not present

## 2019-08-08 DIAGNOSIS — J189 Pneumonia, unspecified organism: Secondary | ICD-10-CM | POA: Diagnosis not present

## 2019-08-08 LAB — CBC WITH DIFFERENTIAL/PLATELET
Abs Immature Granulocytes: 0.09 10*3/uL — ABNORMAL HIGH (ref 0.00–0.07)
Basophils Absolute: 0.1 10*3/uL (ref 0.0–0.1)
Basophils Relative: 0 %
Eosinophils Absolute: 0.5 10*3/uL (ref 0.0–0.5)
Eosinophils Relative: 3 %
HCT: 40.1 % (ref 36.0–46.0)
Hemoglobin: 12.9 g/dL (ref 12.0–15.0)
Immature Granulocytes: 1 %
Lymphocytes Relative: 13 %
Lymphs Abs: 2 10*3/uL (ref 0.7–4.0)
MCH: 30.4 pg (ref 26.0–34.0)
MCHC: 32.2 g/dL (ref 30.0–36.0)
MCV: 94.4 fL (ref 80.0–100.0)
Monocytes Absolute: 1.2 10*3/uL — ABNORMAL HIGH (ref 0.1–1.0)
Monocytes Relative: 8 %
Neutro Abs: 11.9 10*3/uL — ABNORMAL HIGH (ref 1.7–7.7)
Neutrophils Relative %: 75 %
Platelets: 274 10*3/uL (ref 150–400)
RBC: 4.25 MIL/uL (ref 3.87–5.11)
RDW: 12.2 % (ref 11.5–15.5)
WBC: 15.7 10*3/uL — ABNORMAL HIGH (ref 4.0–10.5)
nRBC: 0 % (ref 0.0–0.2)

## 2019-08-08 LAB — PROTIME-INR
INR: 1 (ref 0.8–1.2)
Prothrombin Time: 13.5 seconds (ref 11.4–15.2)

## 2019-08-08 LAB — BODY FLUID CELL COUNT WITH DIFFERENTIAL
Eos, Fluid: 5 %
Lymphs, Fluid: 70 %
Monocyte-Macrophage-Serous Fluid: 19 % — ABNORMAL LOW (ref 50–90)
Neutrophil Count, Fluid: 6 % (ref 0–25)
Total Nucleated Cell Count, Fluid: 2419 cu mm — ABNORMAL HIGH (ref 0–1000)

## 2019-08-08 LAB — PROTEIN, PLEURAL OR PERITONEAL FLUID: Total protein, fluid: 3.1 g/dL

## 2019-08-08 LAB — URINE CULTURE: Culture: <10000 — AB

## 2019-08-08 LAB — GRAM STAIN

## 2019-08-08 LAB — BASIC METABOLIC PANEL
Anion gap: 9 (ref 5–15)
BUN: 11 mg/dL (ref 8–23)
CO2: 22 mmol/L (ref 22–32)
Calcium: 7.7 mg/dL — ABNORMAL LOW (ref 8.9–10.3)
Chloride: 104 mmol/L (ref 98–111)
Creatinine, Ser: 0.68 mg/dL (ref 0.44–1.00)
GFR calc Af Amer: >60 mL/min (ref >60)
GFR calc non Af Amer: >60 mL/min (ref >60)
Glucose, Bld: 111 mg/dL — ABNORMAL HIGH (ref 70–99)
Potassium: 3.8 mmol/L (ref 3.5–5.1)
Sodium: 135 mmol/L (ref 135–145)

## 2019-08-08 LAB — MAGNESIUM: Magnesium: 2.2 mg/dL (ref 1.7–2.4)

## 2019-08-08 LAB — HIV ANTIBODY (ROUTINE TESTING W REFLEX): HIV Screen 4th Generation wRfx: NONREACTIVE

## 2019-08-08 LAB — GLUCOSE, PLEURAL OR PERITONEAL FLUID: Glucose, Fluid: 95 mg/dL

## 2019-08-08 LAB — LACTATE DEHYDROGENASE: LDH: 182 U/L (ref 98–192)

## 2019-08-08 MED ORDER — BENZONATATE 100 MG PO CAPS
100.0000 mg | ORAL_CAPSULE | Freq: Three times a day (TID) | ORAL | Status: DC | PRN
  Administered 2019-08-08 – 2019-08-09 (×4): 100 mg via ORAL
  Filled 2019-08-08 (×5): qty 1

## 2019-08-08 MED ORDER — IPRATROPIUM-ALBUTEROL 0.5-2.5 (3) MG/3ML IN SOLN
3.0000 mL | RESPIRATORY_TRACT | Status: DC | PRN
  Administered 2019-08-11: 3 mL via RESPIRATORY_TRACT
  Filled 2019-08-08: qty 3

## 2019-08-08 MED ORDER — SENNOSIDES-DOCUSATE SODIUM 8.6-50 MG PO TABS
2.0000 | ORAL_TABLET | Freq: Every evening | ORAL | Status: DC | PRN
  Administered 2019-08-11: 2 via ORAL
  Filled 2019-08-08: qty 2

## 2019-08-08 MED ORDER — IPRATROPIUM-ALBUTEROL 0.5-2.5 (3) MG/3ML IN SOLN
3.0000 mL | Freq: Three times a day (TID) | RESPIRATORY_TRACT | Status: DC
  Administered 2019-08-08: 20:00:00 3 mL via RESPIRATORY_TRACT
  Filled 2019-08-08 (×2): qty 3

## 2019-08-08 MED ORDER — OXYCODONE HCL 5 MG PO TABS: 5.0000 mg | ORAL_TABLET | Freq: Four times a day (QID) | ORAL | Status: DC | PRN

## 2019-08-08 MED ORDER — DEXTROMETHORPHAN POLISTIREX ER 30 MG/5ML PO SUER
30.0000 mg | Freq: Two times a day (BID) | ORAL | Status: DC
  Administered 2019-08-08 – 2019-08-12 (×9): 30 mg via ORAL
  Filled 2019-08-08 (×12): qty 5

## 2019-08-08 MED ORDER — POLYETHYLENE GLYCOL 3350 17 G PO PACK: 17.0000 g | PACK | Freq: Every day | ORAL | Status: DC | PRN

## 2019-08-08 MED ORDER — LIDOCAINE HCL 1 % IJ SOLN
INTRAMUSCULAR | Status: AC
  Filled 2019-08-08: qty 20

## 2019-08-08 MED ORDER — IPRATROPIUM-ALBUTEROL 0.5-2.5 (3) MG/3ML IN SOLN
3.0000 mL | Freq: Four times a day (QID) | RESPIRATORY_TRACT | Status: DC
  Administered 2019-08-08: 3 mL via RESPIRATORY_TRACT
  Filled 2019-08-08: qty 3

## 2019-08-08 NOTE — H&P (View-Only) (Signed)
NAME:  Jordan Clay, MRN:  [id number], DOB:  07-27-1951, LOS: 1 ADMISSION DATE:  08/07/2019, CONSULTATION DATE:  10/20 REFERRING MD:  Reesa Chew (THR) , CHIEF COMPLAINT:  Cough, SOB  Brief History   68yo female smoker (40 pack years, quit 1 week ago when she got pneumonia) with hx COPD admitted 10/19 for postobstructive PNA.  History of present illness   67 year old female with history of COPD presented 10/19 to ER with cough and shortness of breath x2 weeks.  She was treated by her PCP for pneumonia but did not tolerate the antibiotics and was switched to [facility] 2 days prior to admission.  She continues to have severe cough and dyspnea despite this.  Cough is nonproductive.  Unsure if she has had fever.  Has not slept well 1 weeks related to persistent cough.  No sick contacts that she is aware of.  Past Medical History   has a past medical history of COPD (chronic obstructive pulmonary disease) ([place]), High cholesterol, and Pneumonia.   Significant Hospital Events     Consults:  PCCM 10/20>>>  Procedures:  IR R thoracentesis 10/20 >>  Significant Diagnostic Tests:  CTA chest 10/19>>> No evidence of pulmonary embolism.  Obstruction of central right lower lobe bronchus and central low-attenuation, suspicious for centrally obstructing mass.  Near complete right lower lobe consolidation, suspicious for postobstructive pneumonitis. Moderate right pleural effusion.  New bulky mediastinal and right hilar lymphadenopathy, consistent with metastatic disease.  Stable sub-cm bilateral upper lobe and right middle lobe pulmonary nodules.  Micro Data:  BC x 2 10/19>>>  COVID 10/19>>> neg   Antimicrobials:  Rocephin 10/19>>> azithro 10/19>>>  Interim history/subjective:  Feels a little better today.  Actually slept last night for the first time in weeks after receiving Ambien.  Still has persistent cough.  Going down to radiology for thoracentesis now.  Objective   Blood pressure (!)  99/52, pulse 87, temperature 98.3 F (36.8 C), temperature source Oral, resp. rate 20, height 5\' 7"  (1.702 m), weight 70.3 kg, SpO2 94 %.        Intake/Output Summary (Last 24 hours) at 08/08/2019 0936 Last data filed at 08/08/2019 0600 Gross per 24 hour  Intake 600.65 ml  Output -  Net 600.65 ml   Filed Weights   08/07/19 1504  Weight: 70.3 kg    Examination: General: Pleasant female, no acute distress although she does have persistent/near constant cough HENT: Mucous membranes moist, no JVD, no appreciable cervical lymphadenopathy Lungs: Respirations are even, nonlabored on 2 L nasal cannula, persistent dry cough, no obvious dyspnea, diminished right Cardiovascular: S1-S2, RRR Abdomen: Soft, nontender Extremities: Warm and dry, no edema Neuro: Awake, alert, moves all extremities  Resolved Hospital Problem list     Assessment & Plan:  Right lower lobe bronchus obstruction, concerning for mass with postobstructive pneumonia and right pleural effusion.  Concerning for malignancy, also with significant mediastinal and right hilar lymphadenopathy. Plan- IR thoracentesis-send fluid for cytology Likely also proceed with FOB versus EBUS as well for tissue sampling-respiratory status looks stable for EBUS if needed Discussed with Dr. Lamonte Sakai, he is reviewing scans and discussing arrangements with RT/OR Continue antibiotics per primary Sputum culture if able Supplemental O2 as needed to keep O2 sats greater than 92% Bronchodilators will add further cough suppression   Labs   CBC: Recent Labs  Lab 08/07/19 1556 08/08/19 0523  WBC 19.3* 15.7*  NEUTROABS 15.1* 11.9*  HGB 14.6 12.9  HCT 45.4 40.1  MCV 93.2 94.4  PLT 301 211    Basic Metabolic Panel: Recent Labs  Lab 08/07/19 1556 08/08/19 0523  NA 136 135  K 3.6 3.8  CL 103 104  CO2 21* 22  GLUCOSE 128* 111*  BUN 15 11  CREATININE 0.97 0.68  CALCIUM 8.4* 7.7*  MG  --  2.2   GFR: Estimated Creatinine  Clearance: 66.4 mL/min (by C-G formula based on SCr of 0.68 mg/dL). Recent Labs  Lab 08/07/19 1556 08/07/19 1557 08/08/19 0523  WBC 19.3*  --  15.7*  LATICACIDVEN  --  1.8  --     Liver Function Tests: Recent Labs  Lab 08/07/19 1556  AST 23  ALT 29  ALKPHOS 63  BILITOT 0.8  PROT 5.9*  ALBUMIN 2.8*   No results for input(s): LIPASE, AMYLASE in the last 168 hours. No results for input(s): AMMONIA in the last 168 hours.  ABG No results found for: PHART, PCO2ART, PO2ART, HCO3, TCO2, ACIDBASEDEF, O2SAT   Coagulation Profile: No results for input(s): INR, PROTIME in the last 168 hours.  Cardiac Enzymes: No results for input(s): CKTOTAL, CKMB, CKMBINDEX, TROPONINI in the last 168 hours.  HbA1C: No results found for: HGBA1C  CBG: No results for input(s): GLUCAP in the last 168 hours.  Review of Systems:   As per HPI - All other systems reviewed and were neg.    Past Medical History  She,  has a past medical history of COPD (chronic obstructive pulmonary disease) ([place]), High cholesterol, and Pneumonia.   Surgical History   History reviewed. No pertinent surgical history.   Social History   reports that she has been smoking. She has never used smokeless tobacco. She reports that she does not drink alcohol or use drugs.   Family History   Her family history is not on file.   Allergies Allergies  Allergen Reactions  . Ciprocin-Fluocin-Procin [Fluocinolone]   . Minocycline Cough  . Sulfa Antibiotics Cough  . Levofloxacin Nausea And Vomiting and Anxiety     Home Medications  Prior to Admission medications   Medication Sig Start Date End Date Taking? Authorizing Provider  albuterol (VENTOLIN HFA) 108 (90 Base) MCG/ACT inhaler Inhale 2 puffs into the lungs every 4 (four) hours as needed for shortness of breath. 07/31/19  Yes [provider]  atorvastatin (LIPITOR) 20 MG tablet Take 20 mg by mouth daily. 09/03/18  Yes [provider]  benzonatate  (TESSALON) 200 MG capsule Take 200 mg by mouth 3 (three) times daily. 07/31/19  Yes [provider]  cefdinir (OMNICEF) 300 MG capsule Take 300 mg by mouth 2 (two) times daily. 08/06/19  Yes [provider]     Nickolas Madrid, NP 08/08/2019  9:36 AM Pager: [phone] or [phone]

## 2019-08-08 NOTE — Procedures (Signed)
PROCEDURE SUMMARY:  Successful US guided right thoracentesis. Yielded 700 mL of hazy pale amber fluid. Pt tolerated procedure well. No immediate complications.  Specimen was sent for labs. CXR ordered.  EBL < 5 mL  Ascencion Dike PA-C 08/08/2019 10:07 AM

## 2019-08-08 NOTE — Progress Notes (Signed)
PROGRESS NOTE    Jordan Clay  MWN:[id number] DOB: 1951-05-23 DOA: 08/07/2019 PCP: Hulan Fess, MD   Brief Narrative:  68 year old with history of COPD came to the hospital complains of cough and shortness of breath despite of being on outpatient antibiotics.  CT of the chest was negative for pulmonary embolism but concerning for right lower lobe bronchus concerning for obstructive mass and near complete opacification of right lower lobe, moderate right-sided pleural effusion and new bulky mediastinal lymphadenopathy/right hilar lymphadenopathy.   Assessment & Plan:   Principal Problem:   Postobstructive pneumonia Active Problems:   COPD (chronic obstructive pulmonary disease) (HCC)   Pleural effusion on right   Prolonged QT interval   Pulmonary mass  Right lower lobe bronchus obstruction Postobstructive pneumonia with pleural effusion Acute respiratory distress with hypoxia, 2 L nasal cannula -CTA negative for pulmonary embolism, but shows bulky lymphadenopathy concerning for mass/pleural effusion and pneumonia -Currently on IV Rocephin and azithromycin -Sputum culture,  Legionella antigen ordered, strep pneumo-negative -IR for thoracentesis.  Follow-up fluid data including pathology -Pulmonary team consulted -Bronchodilators, incentive spirometer and flutter valve -If fluid cell count and Gram stain negative-stop antibiotics  History of COPD with tobacco use -Bronchodilators.   DVT prophylaxis: SCDs Code Status: Full code Family Communication: Does not wish at this time to discuss possible malignancy diagnosis to her family.  I offered her to speak with her family but at this point she wants me to hold off. Disposition Plan: Maintain hospital stay until her breathing is better  Consultants:   Pulmonary  Procedures:   Thoracentesis planned 10/20  Antimicrobials:   Rocephin day 2  Azithromycin day 2   Subjective: Tells me she still having respiratory  distress and lots of coughing.  Smokes at least half a pack a day but stopped about 2-3 weeks ago.  Does not follow-up with pulmonologist, her primary care physician used to get routine CT of the chest to evaluate for nodules but remained stable up until last year.  Review of Systems Otherwise negative except as per HPI, including: General: Denies fever, chills, night sweats or unintended weight loss. Resp: Denies hemoptysis Cardiac: Denies chest pain, palpitations, orthopnea, paroxysmal nocturnal dyspnea. GI: Denies abdominal pain, nausea, vomiting, diarrhea or constipation GU: Denies dysuria, frequency, hesitancy or incontinence MS: Denies muscle aches, joint pain or swelling Neuro: Denies headache, neurologic deficits (focal weakness, numbness, tingling), abnormal gait Psych: Denies anxiety, depression, SI/HI/AVH Skin: Denies new rashes or lesions ID: Denies sick contacts, exotic exposures, travel  Objective: Vitals:   08/07/19 2300 08/08/19 0045 08/08/19 0552 08/08/19 0600  BP:   (!) 99/52   Pulse: 90 83 87   Resp:  (!) 21 (!) 23 20  Temp:   98.3 F (36.8 C)   TempSrc:   Oral   SpO2:   94%   Weight:      Height:        Intake/Output Summary (Last 24 hours) at 08/08/2019 0802 Last data filed at 08/08/2019 0600 Gross per 24 hour  Intake 600.65 ml  Output --  Net 600.65 ml   Filed Weights   08/07/19 1504  Weight: 70.3 kg    Examination:  General exam: Appears calm and comfortable, 2 L nasal cannula Respiratory system: Diffuse coarse breath sounds Cardiovascular system: S1 & S2 heard, RRR. No JVD, murmurs, rubs, gallops or clicks. No pedal edema. Gastrointestinal system: Abdomen is nondistended, soft and nontender. No organomegaly or masses felt. Normal bowel sounds heard. Central nervous system: Alert and  oriented. No focal neurological deficits. Extremities: Symmetric 5 x 5 power. Skin: No rashes, lesions or ulcers Psychiatry: Judgement and insight appear normal.  Mood & affect appropriate.     Data Reviewed:   CBC: Recent Labs  Lab 08/07/19 1556 08/08/19 0523  WBC 19.3* 15.7*  NEUTROABS 15.1* 11.9*  HGB 14.6 12.9  HCT 45.4 40.1  MCV 93.2 94.4  PLT 301 664   Basic Metabolic Panel: Recent Labs  Lab 08/07/19 1556 08/08/19 0523  NA 136 135  K 3.6 3.8  CL 103 104  CO2 21* 22  GLUCOSE 128* 111*  BUN 15 11  CREATININE 0.97 0.68  CALCIUM 8.4* 7.7*  MG  --  2.2   GFR: Estimated Creatinine Clearance: 66.4 mL/min (by C-G formula based on SCr of 0.68 mg/dL). Liver Function Tests: Recent Labs  Lab 08/07/19 1556  AST 23  ALT 29  ALKPHOS 63  BILITOT 0.8  PROT 5.9*  ALBUMIN 2.8*   No results for input(s): LIPASE, AMYLASE in the last 168 hours. No results for input(s): AMMONIA in the last 168 hours. Coagulation Profile: No results for input(s): INR, PROTIME in the last 168 hours. Cardiac Enzymes: No results for input(s): CKTOTAL, CKMB, CKMBINDEX, TROPONINI in the last 168 hours. BNP (last 3 results) No results for input(s): PROBNP in the last 8760 hours. HbA1C: No results for input(s): HGBA1C in the last 72 hours. CBG: No results for input(s): GLUCAP in the last 168 hours. Lipid Profile: No results for input(s): CHOL, HDL, LDLCALC, TRIG, CHOLHDL, LDLDIRECT in the last 72 hours. Thyroid Function Tests: No results for input(s): TSH, T4TOTAL, FREET4, T3FREE, THYROIDAB in the last 72 hours. Anemia Panel: No results for input(s): VITAMINB12, FOLATE, FERRITIN, TIBC, IRON, RETICCTPCT in the last 72 hours. Sepsis Labs: Recent Labs  Lab 08/07/19 1557  LATICACIDVEN 1.8    Recent Results (from the past 240 hour(s))  Novel Coronavirus, NAA ([facility])     Status: None   Collection Time: 07/31/19 12:00 AM   Specimen: Nasopharyngeal(NP) swabs in vial transport medium   NASOPHARYNGE  TESTING  Result Value Ref Range Status   SARS-CoV-2, NAA Not Detected Not Detected Final    Comment: This nucleic acid amplification test was  developed and its performance characteristics determined by [employer]. Nucleic acid amplification tests include PCR and TMA. This test has not been FDA cleared or approved. This test has been authorized by FDA under an Emergency Use Authorization (EUA). This test is only authorized for the duration of time the declaration that circumstances exist justifying the authorization of the emergency use of in vitro diagnostic tests for detection of SARS-CoV-2 virus and/or diagnosis of COVID-19 infection under section 564(b)(1) of the Act, 21 U.S.C. 403KVQ-2(V) (1), unless the authorization is terminated or revoked sooner. When diagnostic testing is negative, the possibility of a false negative result should be considered in the context of a patient's recent exposures and the presence of clinical signs and symptoms consistent with COVID-19. An individual without symptoms of COVID-19 and who is not shedding SARS-CoV-2 virus would  expect to have a negative (not detected) result in this assay.   SARS CORONAVIRUS 2 (TAT 6-24 HRS) Nasopharyngeal Nasopharyngeal Swab     Status: None   Collection Time: 08/07/19  4:39 PM   Specimen: Nasopharyngeal Swab  Result Value Ref Range Status   SARS Coronavirus 2 NEGATIVE NEGATIVE Final    Comment: (NOTE) SARS-CoV-2 target nucleic acids are NOT DETECTED. The SARS-CoV-2 RNA is generally detectable in upper and  lower respiratory specimens during the acute phase of infection. Negative results do not preclude SARS-CoV-2 infection, do not rule out co-infections with other pathogens, and should not be used as the sole basis for treatment or other patient management decisions. Negative results must be combined with clinical observations, patient history, and epidemiological information. The expected result is Negative. Fact Sheet for Patients: SugarRoll.be Fact Sheet for Healthcare  Providers: https://www.woods-mathews.com/ This test is not yet approved or cleared by the [place] FDA and  has been authorized for detection and/or diagnosis of SARS-CoV-2 by FDA under an Emergency Use Authorization (EUA). This EUA will remain  in effect (meaning this test can be used) for the duration of the COVID-19 declaration under Section 56 4(b)(1) of the Act, 21 U.S.C. section 360bbb-3(b)(1), unless the authorization is terminated or revoked sooner. Performed at [facility], [place] [address], [place], [place] [address]          Radiology Studies: Ct Angio Chest Pe W/cm &/or Wo Cm  Result Date: 08/07/2019 CLINICAL DATA:  Shortness of breath and hypoxia. Recent pneumonia. EXAM: CT ANGIOGRAPHY CHEST WITH CONTRAST TECHNIQUE: Multidetector CT imaging of the chest was performed using the standard protocol during bolus administration of intravenous contrast. Multiplanar CT image reconstructions and MIPs were obtained to evaluate the vascular anatomy. CONTRAST:  147mL OMNIPAQUE IOHEXOL 350 MG/ML SOLN COMPARISON:  06/10/2018 FINDINGS: Cardiovascular: Satisfactory opacification of pulmonary arteries noted, and no pulmonary emboli identified. No evidence of thoracic aortic dissection or aneurysm. Aortic atherosclerosis. Mediastinum/Nodes: New bulky lymphadenopathy is seen throughout the right paratracheal and subcarinal regions, with subcarinal soft tissue density measuring up to 4.2 cm short axis. Right hilar lymphadenopathy is also seen measuring at least 2.2 cm in short axis. Obstruction of the central right lower lobe bronchus and central low-attenuation is suspicious for centrally obstructing mass. Lungs/Pleura: There is near complete right lower lobe consolidation, suspicious for postobstructive pneumonitis. A moderate right pleural effusion is also seen. Several bilateral upper lobe and right middle lobe pulmonary nodules are seen measuring up to 5 mm which are stable.  Mild emphysema again noted.33 Upper abdomen: No acute findings. Musculoskeletal: No suspicious bone lesions identified. Review of the MIP images confirms the above findings. IMPRESSION: No evidence of pulmonary embolism. Obstruction of central right lower lobe bronchus and central low-attenuation, suspicious for centrally obstructing mass. Consider further evaluation with bronchoscopy. Near complete right lower lobe consolidation, suspicious for postobstructive pneumonitis. Moderate right pleural effusion. Consider diagnostic thoracentesis. New bulky mediastinal and right hilar lymphadenopathy, consistent with metastatic disease. Stable sub-cm bilateral upper lobe and right middle lobe pulmonary nodules. Aortic Atherosclerosis (ICD10-I70.0) and Emphysema (ICD10-J43.9). Electronically Signed   By: Marlaine Hind M.D.   On: 08/07/2019 19:23   Dg Chest Port 1 View  Result Date: 08/07/2019 CLINICAL DATA:  Worsening short of breath and hypoxia.  COPD. EXAM: PORTABLE CHEST 1 VIEW COMPARISON:  08/04/2019 FINDINGS: Progression of right lower lobe infiltrate compared to the prior study. Possible right pleural effusion has developed. Left lung remains clear. Negative for heart failure or edema. Atherosclerotic aortic arch. IMPRESSION: Progression of right lower lobe infiltrate and probable right pleural effusion. Probable pneumonia. Given the progression, CT chest with contrast may be helpful for further evaluation. Electronically Signed   By: Franchot Gallo M.D.   On: 08/07/2019 15:44        Scheduled Meds:  sodium chloride flush  3 mL Intravenous Q12H   Continuous Infusions:  sodium chloride Stopped (08/08/19 0735)   azithromycin  cefTRIAXone (ROCEPHIN)  IV       LOS: 1 day   Time spent= 40 mins    Kassidy Dockendorf Arsenio Loader, MD [facility]  If 7PM-7AM, please contact night-coverage  08/08/2019, 8:02 AM

## 2019-08-08 NOTE — Consult Note (Signed)
NAME:  Jordan Clay, MRN:  [id number], DOB:  [birth year], LOS: 1 ADMISSION DATE:  08/07/2019, CONSULTATION DATE:  10/20 REFERRING MD:  Reesa Chew (THR) , CHIEF COMPLAINT:  Cough, SOB  Brief History   68yo female smoker (40 pack years, quit 1 week ago when she got pneumonia) with hx COPD admitted 10/19 for postobstructive PNA.  History of present illness   68 year old female with history of COPD presented 10/19 to ER with cough and shortness of breath x2 weeks.  She was treated by her PCP for pneumonia but did not tolerate the antibiotics and was switched to [facility] 2 days prior to admission.  She continues to have severe cough and dyspnea despite this.  Cough is nonproductive.  Unsure if she has had fever.  Has not slept well 1 weeks related to persistent cough.  No sick contacts that she is aware of.  Past Medical History   has a past medical history of COPD (chronic obstructive pulmonary disease) ([place]), High cholesterol, and Pneumonia.   Significant Hospital Events     Consults:  PCCM 10/20>>>  Procedures:  IR R thoracentesis 10/20 >>  Significant Diagnostic Tests:  CTA chest 10/19>>> No evidence of pulmonary embolism.  Obstruction of central right lower lobe bronchus and central low-attenuation, suspicious for centrally obstructing mass.  Near complete right lower lobe consolidation, suspicious for postobstructive pneumonitis. Moderate right pleural effusion.  New bulky mediastinal and right hilar lymphadenopathy, consistent with metastatic disease.  Stable sub-cm bilateral upper lobe and right middle lobe pulmonary nodules.  Micro Data:  BC x 2 10/19>>>  COVID 10/19>>> neg   Antimicrobials:  Rocephin 10/19>>> azithro 10/19>>>  Interim history/subjective:  Feels a little better today.  Actually slept last night for the first time in weeks after receiving Ambien.  Still has persistent cough.  Going down to radiology for thoracentesis now.  Objective   Blood pressure (!)  99/52, pulse 87, temperature 98.3 F (36.8 C), temperature source Oral, resp. rate 20, height 5\' 7"  (1.702 m), weight 70.3 kg, SpO2 94 %.        Intake/Output Summary (Last 24 hours) at 08/08/2019 0936 Last data filed at 08/08/2019 0600 Gross per 24 hour  Intake 600.65 ml  Output -  Net 600.65 ml   Filed Weights   08/07/19 1504  Weight: 70.3 kg    Examination: General: Pleasant female, no acute distress although she does have persistent/near constant cough HENT: Mucous membranes moist, no JVD, no appreciable cervical lymphadenopathy Lungs: Respirations are even, nonlabored on 2 L nasal cannula, persistent dry cough, no obvious dyspnea, diminished right Cardiovascular: S1-S2, RRR Abdomen: Soft, nontender Extremities: Warm and dry, no edema Neuro: Awake, alert, moves all extremities  Resolved Hospital Problem list     Assessment & Plan:  Right lower lobe bronchus obstruction, concerning for mass with postobstructive pneumonia and right pleural effusion.  Concerning for malignancy, also with significant mediastinal and right hilar lymphadenopathy. Plan- IR thoracentesis-send fluid for cytology Likely also proceed with FOB versus EBUS as well for tissue sampling-respiratory status looks stable for EBUS if needed Discussed with Dr. Lamonte Sakai, he is reviewing scans and discussing arrangements with RT/OR Continue antibiotics per primary Sputum culture if able Supplemental O2 as needed to keep O2 sats greater than 92% Bronchodilators will add further cough suppression   Labs   CBC: Recent Labs  Lab 08/07/19 1556 08/08/19 0523  WBC 19.3* 15.7*  NEUTROABS 15.1* 11.9*  HGB 14.6 12.9  HCT 45.4 40.1  MCV 93.2 94.4  PLT 301 778    Basic Metabolic Panel: Recent Labs  Lab 08/07/19 1556 08/08/19 0523  NA 136 135  K 3.6 3.8  CL 103 104  CO2 21* 22  GLUCOSE 128* 111*  BUN 15 11  CREATININE 0.97 0.68  CALCIUM 8.4* 7.7*  MG  --  2.2   GFR: Estimated Creatinine  Clearance: 66.4 mL/min (by C-G formula based on SCr of 0.68 mg/dL). Recent Labs  Lab 08/07/19 1556 08/07/19 1557 08/08/19 0523  WBC 19.3*  --  15.7*  LATICACIDVEN  --  1.8  --     Liver Function Tests: Recent Labs  Lab 08/07/19 1556  AST 23  ALT 29  ALKPHOS 63  BILITOT 0.8  PROT 5.9*  ALBUMIN 2.8*   No results for input(s): LIPASE, AMYLASE in the last 168 hours. No results for input(s): AMMONIA in the last 168 hours.  ABG No results found for: PHART, PCO2ART, PO2ART, HCO3, TCO2, ACIDBASEDEF, O2SAT   Coagulation Profile: No results for input(s): INR, PROTIME in the last 168 hours.  Cardiac Enzymes: No results for input(s): CKTOTAL, CKMB, CKMBINDEX, TROPONINI in the last 168 hours.  HbA1C: No results found for: HGBA1C  CBG: No results for input(s): GLUCAP in the last 168 hours.  Review of Systems:   As per HPI - All other systems reviewed and were neg.    Past Medical History  She,  has a past medical history of COPD (chronic obstructive pulmonary disease) ([place]), High cholesterol, and Pneumonia.   Surgical History   History reviewed. No pertinent surgical history.   Social History   reports that she has been smoking. She has never used smokeless tobacco. She reports that she does not drink alcohol or use drugs.   Family History   Her family history is not on file.   Allergies Allergies  Allergen Reactions  . Ciprocin-Fluocin-Procin [Fluocinolone]   . Minocycline Cough  . Sulfa Antibiotics Cough  . Levofloxacin Nausea And Vomiting and Anxiety     Home Medications  Prior to Admission medications   Medication Sig Start Date End Date Taking? Authorizing Provider  albuterol (VENTOLIN HFA) 108 (90 Base) MCG/ACT inhaler Inhale 2 puffs into the lungs every 4 (four) hours as needed for shortness of breath. 07/31/19  Yes [provider]  atorvastatin (LIPITOR) 20 MG tablet Take 20 mg by mouth daily. 09/03/18  Yes [provider]  benzonatate  (TESSALON) 200 MG capsule Take 200 mg by mouth 3 (three) times daily. 07/31/19  Yes [provider]  cefdinir (OMNICEF) 300 MG capsule Take 300 mg by mouth 2 (two) times daily. 08/06/19  Yes [provider]     Nickolas Madrid, NP 08/08/2019  9:36 AM Pager: [phone] or [phone]

## 2019-08-09 ENCOUNTER — Inpatient Hospital Stay (HOSPITAL_COMMUNITY): Payer: Medicare Other | Admitting: Certified Registered Nurse Anesthetist

## 2019-08-09 ENCOUNTER — Encounter (HOSPITAL_COMMUNITY)
Admission: EM | Disposition: A | Discharge status: Hospice | Payer: Self-pay | Source: Home / Self Care | Attending: Internal Medicine

## 2019-08-09 ENCOUNTER — Inpatient Hospital Stay (HOSPITAL_COMMUNITY): Payer: Medicare Other

## 2019-08-09 ENCOUNTER — Encounter (HOSPITAL_COMMUNITY): Payer: Self-pay | Admitting: *Deleted

## 2019-08-09 ENCOUNTER — Ambulatory Visit (HOSPITAL_COMMUNITY): Payer: Medicare Other | Attending: Family Medicine

## 2019-08-09 DIAGNOSIS — R05 Cough: Secondary | ICD-10-CM | POA: Diagnosis not present

## 2019-08-09 DIAGNOSIS — J449 Chronic obstructive pulmonary disease, unspecified: Secondary | ICD-10-CM

## 2019-08-09 DIAGNOSIS — R918 Other nonspecific abnormal finding of lung field: Secondary | ICD-10-CM | POA: Diagnosis not present

## 2019-08-09 DIAGNOSIS — R0602 Shortness of breath: Secondary | ICD-10-CM

## 2019-08-09 HISTORY — PX: BRONCHIAL WASHINGS: SHX5105

## 2019-08-09 HISTORY — PX: ENDOBRONCHIAL ULTRASOUND: SHX5096

## 2019-08-09 HISTORY — PX: VIDEO BRONCHOSCOPY: SHX5072

## 2019-08-09 HISTORY — PX: FINE NEEDLE ASPIRATION BIOPSY: CATH118315

## 2019-08-09 HISTORY — PX: BRONCHIAL BRUSHINGS: SHX5108

## 2019-08-09 LAB — COMPREHENSIVE METABOLIC PANEL
ALT: 32 U/L (ref 0–44)
AST: 21 U/L (ref 15–41)
Albumin: 2.3 g/dL — ABNORMAL LOW (ref 3.5–5.0)
Alkaline Phosphatase: 48 U/L (ref 38–126)
Anion gap: 9 (ref 5–15)
BUN: 10 mg/dL (ref 8–23)
CO2: 22 mmol/L (ref 22–32)
Calcium: 7.6 mg/dL — ABNORMAL LOW (ref 8.9–10.3)
Chloride: 101 mmol/L (ref 98–111)
Creatinine, Ser: 0.71 mg/dL (ref 0.44–1.00)
GFR calc Af Amer: >60 mL/min (ref >60)
GFR calc non Af Amer: >60 mL/min (ref >60)
Glucose, Bld: 122 mg/dL — ABNORMAL HIGH (ref 70–99)
Potassium: 3.9 mmol/L (ref 3.5–5.1)
Sodium: 132 mmol/L — ABNORMAL LOW (ref 135–145)
Total Bilirubin: 0.7 mg/dL (ref 0.3–1.2)
Total Protein: 5.1 g/dL — ABNORMAL LOW (ref 6.5–8.1)

## 2019-08-09 LAB — PROCALCITONIN: Procalcitonin: <0.1 ng/mL

## 2019-08-09 LAB — LEGIONELLA PNEUMOPHILA SEROGP 1 UR AG: L. pneumophila Serogp 1 Ur Ag: NEGATIVE

## 2019-08-09 LAB — CBC
HCT: 40.5 % (ref 36.0–46.0)
Hemoglobin: 13 g/dL (ref 12.0–15.0)
MCH: 29.9 pg (ref 26.0–34.0)
MCHC: 32.1 g/dL (ref 30.0–36.0)
MCV: 93.1 fL (ref 80.0–100.0)
Platelets: 274 10*3/uL (ref 150–400)
RBC: 4.35 MIL/uL (ref 3.87–5.11)
RDW: 12.1 % (ref 11.5–15.5)
WBC: 15.7 10*3/uL — ABNORMAL HIGH (ref 4.0–10.5)
nRBC: 0 % (ref 0.0–0.2)

## 2019-08-09 LAB — PH, BODY FLUID: pH, Body Fluid: 7.4

## 2019-08-09 LAB — TRIGLYCERIDES, BODY FLUIDS: Triglycerides, Fluid: 30 mg/dL

## 2019-08-09 LAB — MAGNESIUM: Magnesium: 2.1 mg/dL (ref 1.7–2.4)

## 2019-08-09 LAB — GLUCOSE, CAPILLARY: Glucose-Capillary: 115 mg/dL — ABNORMAL HIGH (ref 70–99)

## 2019-08-09 LAB — BRAIN NATRIURETIC PEPTIDE: B Natriuretic Peptide: 30.2 pg/mL (ref 0.0–100.0)

## 2019-08-09 SURGERY — ENDOBRONCHIAL ULTRASOUND (EBUS)
Anesthesia: General

## 2019-08-09 MED ORDER — IOHEXOL 300 MG/ML  SOLN
30.0000 mL | Freq: Once | INTRAMUSCULAR | Status: AC | PRN
  Administered 2019-08-09: 30 mL via ORAL

## 2019-08-09 MED ORDER — FENTANYL CITRATE (PF) 100 MCG/2ML IJ SOLN: 25.0000 ug | INTRAMUSCULAR | Status: DC | PRN

## 2019-08-09 MED ORDER — PROPOFOL 10 MG/ML IV BOLUS
INTRAVENOUS | Status: DC | PRN
  Administered 2019-08-09 (×3): 50 mg via INTRAVENOUS

## 2019-08-09 MED ORDER — LIDOCAINE 2% (20 MG/ML) 5 ML SYRINGE
INTRAMUSCULAR | Status: DC | PRN
  Administered 2019-08-09: 60 mg via INTRAVENOUS

## 2019-08-09 MED ORDER — PROPOFOL 10 MG/ML IV BOLUS
INTRAVENOUS | Status: AC
  Filled 2019-08-09: qty 20

## 2019-08-09 MED ORDER — SUCCINYLCHOLINE CHLORIDE 200 MG/10ML IV SOSY
PREFILLED_SYRINGE | INTRAVENOUS | Status: DC | PRN
  Administered 2019-08-09: 60 mg via INTRAVENOUS

## 2019-08-09 MED ORDER — SODIUM CHLORIDE (PF) 0.9 % IJ SOLN
INTRAMUSCULAR | Status: AC
  Filled 2019-08-09: qty 50

## 2019-08-09 MED ORDER — PHENYLEPHRINE 40 MCG/ML (10ML) SYRINGE FOR IV PUSH (FOR BLOOD PRESSURE SUPPORT)
PREFILLED_SYRINGE | INTRAVENOUS | Status: DC | PRN
  Administered 2019-08-09 (×6): 80 ug via INTRAVENOUS

## 2019-08-09 MED ORDER — BENZONATATE 100 MG PO CAPS
200.0000 mg | ORAL_CAPSULE | Freq: Three times a day (TID) | ORAL | Status: DC
  Administered 2019-08-09 – 2019-08-11 (×8): 200 mg via ORAL
  Filled 2019-08-09 (×9): qty 2

## 2019-08-09 MED ORDER — ONDANSETRON HCL 4 MG/2ML IJ SOLN
INTRAMUSCULAR | Status: DC | PRN
  Administered 2019-08-09: 4 mg via INTRAVENOUS

## 2019-08-09 MED ORDER — KETOROLAC TROMETHAMINE 30 MG/ML IJ SOLN
15.0000 mg | Freq: Once | INTRAMUSCULAR | Status: AC | PRN
  Filled 2019-08-09: qty 1

## 2019-08-09 MED ORDER — FENTANYL CITRATE (PF) 100 MCG/2ML IJ SOLN
INTRAMUSCULAR | Status: AC
  Filled 2019-08-09: qty 2

## 2019-08-09 MED ORDER — DEXAMETHASONE SODIUM PHOSPHATE 10 MG/ML IJ SOLN
INTRAMUSCULAR | Status: DC | PRN
  Administered 2019-08-09: 10 mg via INTRAVENOUS

## 2019-08-09 MED ORDER — ACETAMINOPHEN 10 MG/ML IV SOLN
1000.0000 mg | Freq: Once | INTRAVENOUS | Status: DC | PRN
  Filled 2019-08-09: qty 100

## 2019-08-09 MED ORDER — IPRATROPIUM-ALBUTEROL 0.5-2.5 (3) MG/3ML IN SOLN
3.0000 mL | Freq: Two times a day (BID) | RESPIRATORY_TRACT | Status: DC
  Administered 2019-08-09 – 2019-08-11 (×4): 3 mL via RESPIRATORY_TRACT
  Filled 2019-08-09 (×5): qty 3

## 2019-08-09 MED ORDER — IOHEXOL 300 MG/ML  SOLN
100.0000 mL | Freq: Once | INTRAMUSCULAR | Status: AC | PRN
  Administered 2019-08-09: 18:00:00 100 mL via INTRAVENOUS

## 2019-08-09 MED ORDER — FENTANYL CITRATE (PF) 250 MCG/5ML IJ SOLN
INTRAMUSCULAR | Status: DC | PRN
  Administered 2019-08-09: 50 ug via INTRAVENOUS

## 2019-08-09 MED ORDER — LACTATED RINGERS IV SOLN
INTRAVENOUS | Status: DC | PRN
  Administered 2019-08-09: 11:00:00 via INTRAVENOUS

## 2019-08-09 NOTE — Anesthesia Preprocedure Evaluation (Addendum)
Anesthesia Evaluation  Patient identified by MRN, date of birth, ID band Patient awake    Reviewed: Allergy & Precautions, NPO status , Patient's Chart, lab work & pertinent test results  Airway Mallampati: I  TM Distance: >3 FB Neck ROM: Full    Dental  (+) Missing   Pulmonary shortness of breath and with exertion, pneumonia, COPD,  COPD inhaler, former smoker,   Cough Tachypnea    breath sounds clear to auscultation       Cardiovascular negative cardio ROS Normal cardiovascular exam Rhythm:Regular Rate:Normal  ECG: MAT, 114  ECHO: Normal LV size with EF 55-60%. Moderate diastolic dysfunction. Normal RV size and systolic function. No significant valvular abnormalities.    Neuro/Psych negative neurological ROS  negative psych ROS   GI/Hepatic negative GI ROS, Neg liver ROS,   Endo/Other  negative endocrine ROS  Renal/GU negative Renal ROS     Musculoskeletal negative musculoskeletal ROS (+)   Abdominal   Peds  Hematology HLD   Anesthesia Other Findings Right lower lobe bronchus obstruction  Reproductive/Obstetrics                           Anesthesia Physical Anesthesia Plan  ASA: III  Anesthesia Plan: General   Post-op Pain Management:    Induction: Intravenous  PONV Risk Score and Plan: 3 and Ondansetron, Dexamethasone and Treatment may vary due to age or medical condition  Airway Management Planned: Oral ETT  Additional Equipment:   Intra-op Plan:   Post-operative Plan: Extubation in OR and Possible Post-op intubation/ventilation  Informed Consent: I have reviewed the patients History and Physical, chart, labs and discussed the procedure including the risks, benefits and alternatives for the proposed anesthesia with the patient or authorized representative who has indicated his/her understanding and acceptance.       Plan Discussed with: CRNA  Anesthesia Plan  Comments:        Anesthesia Quick Evaluation

## 2019-08-09 NOTE — Progress Notes (Signed)
Assisted with EBUS procedure.  Samples sent to the lab.  Video bronchoscopy performed after EBUS.  Interventions bronchial washing and bronchial brushing.  Patient tolerated well.  Will continue to monitor.

## 2019-08-09 NOTE — Anesthesia Postprocedure Evaluation (Signed)
Anesthesia Post Note  Patient: Jordan Clay  Procedure(s) Performed: ENDOBRONCHIAL ULTRASOUND (Bilateral ) VIDEO BRONCHOSCOPY WITH FLUORO (N/A ) FINE NEEDLE ASPIRATION BIOPSY BRONCHIAL BRUSHINGS BRONCHIAL WASHINGS     Patient location during evaluation: Endoscopy Anesthesia Type: General Level of consciousness: awake and alert Pain management: pain level controlled Vital Signs Assessment: post-procedure vital signs reviewed and stable Respiratory status: spontaneous breathing, nonlabored ventilation, respiratory function stable and patient connected to nasal cannula oxygen Cardiovascular status: blood pressure returned to baseline and stable Postop Assessment: no apparent nausea or vomiting Anesthetic complications: no    Last Vitals:  Vitals:   08/09/19 1227 08/09/19 1952  BP: 114/62   Pulse: (!) 112   Resp: (!) 22   Temp:    SpO2: 93% 97%    Last Pain:  Vitals:   08/09/19 1227  TempSrc:   PainSc: 0-No pain                 Ryan P Ellender

## 2019-08-09 NOTE — Consult Note (Signed)
Reason for the request:    Lung mass  HPI: I was asked by Dr. Algis Liming   to evaluate Jordan Clay for new finding of a lung mass.  She is 68 year old with history of COPD presented with the symptoms of shortness of breath and dyspnea on exertion on 08/07/2019.  Her evaluation including a CT scan of the chest to rule out pulmonary embolism which showed no PE at this time.  It did show a mass in the right lower lobe likely causing obstruction with bulky mediastinal and subcarinal and hilar lymphadenopathy.  She has also loculated pleural effusion at that time.  She underwent a right thoracentesis with 700 cc of fluid removed at that time.  Pulmonary medicine was consulted and she underwent a bronchoscopy and found to have a right lower lobe mass associated with hilar mediastinal adenopathy.  Biopsies were obtained from the mass as well as lymph nodes and cytology was also sent at this time.  The results are currently pending.  Clinically, she is quite debilitated at this time complaining of symptoms of shortness of breath and cough.  He is denying any pain or headaches at this time.  He denies any abdominal pain or nausea.  She does not report any headaches, blurry vision, syncope or seizures. Does not report any fevers, chills or sweats.  Does not report any cough, wheezing or hemoptysis.  Does not report any chest pain, palpitation, orthopnea or leg edema.  Does not report any nausea, vomiting or abdominal pain.  Does not report any constipation or diarrhea.  Does not report any skeletal complaints.    Does not report frequency, urgency or hematuria.  Does not report any skin rashes or lesions. Does not report any heat or cold intolerance.  Does not report any lymphadenopathy or petechiae.  Does not report any anxiety or depression.  Remaining review of systems is negative.    Past Medical History:  Diagnosis Date  . COPD (chronic obstructive pulmonary disease) ([place])   . High cholesterol   . Pneumonia    :  History reviewed. No pertinent surgical history.:   Current Facility-Administered Medications:  .  acetaminophen (OFIRMEV) IV 1,000 mg, 1,000 mg, Intravenous, Once PRN, Jordan Clay, Jordan Kinnier, MD .  acetaminophen (TYLENOL) tablet 650 mg, 650 mg, Oral, Q6H PRN, 650 mg at 08/09/19 1248 **OR** acetaminophen (TYLENOL) suppository 650 mg, 650 mg, Rectal, Q6H PRN, Collene Gobble, MD .  azithromycin (ZITHROMAX) 500 mg in sodium chloride 0.9 % 250 mL IVPB, 500 mg, Intravenous, Q24H, Byrum, Rose Fillers, MD, Last Rate: 250 mL/hr at 08/08/19 1600, 500 mg at 08/08/19 1600 .  benzonatate (TESSALON) capsule 200 mg, 200 mg, Oral, TID, Hongalgi, Anand D, MD .  cefTRIAXone (ROCEPHIN) 2 g in sodium chloride 0.9 % 100 mL IVPB, 2 g, Intravenous, Q24H, Byrum, Rose Fillers, MD, Last Rate: 200 mL/hr at 08/08/19 1523, 2 g at 08/08/19 1523 .  dextromethorphan (DELSYM) 30 MG/5ML liquid 30 mg, 30 mg, Oral, BID, Collene Gobble, MD, 30 mg at 08/08/19 2100 .  fentaNYL (SUBLIMAZE) injection 25-50 mcg, 25-50 mcg, Intravenous, Q5 min PRN, Jordan Clay, Jordan Kinnier, MD .  HYDROcodone-acetaminophen (NORCO/VICODIN) 5-325 MG per tablet 1-2 tablet, 1-2 tablet, Oral, Q4H PRN, Byrum, Rose Fillers, MD .  ipratropium-albuterol (DUONEB) 0.5-2.5 (3) MG/3ML nebulizer solution 3 mL, 3 mL, Nebulization, Q4H PRN, Byrum, Rose Fillers, MD .  ipratropium-albuterol (DUONEB) 0.5-2.5 (3) MG/3ML nebulizer solution 3 mL, 3 mL, Nebulization, BID, Hongalgi, Anand D, MD .  ketorolac (TORADOL) 30  MG/ML injection 15 mg, 15 mg, Intravenous, Once PRN, Jordan Clay, Jordan Kinnier, MD .  morphine 4 MG/ML injection 4 mg, 4 mg, Intravenous, Q4H PRN, Collene Gobble, MD, 4 mg at 08/09/19 1318 .  oxyCODONE (Oxy IR/ROXICODONE) immediate release tablet 5 mg, 5 mg, Oral, Q6H PRN, Byrum, Rose Fillers, MD .  polyethylene glycol (MIRALAX / GLYCOLAX) packet 17 g, 17 g, Oral, Daily PRN, Byrum, Rose Fillers, MD .  senna-docusate (Senokot-S) tablet 2 tablet, 2 tablet, Oral, QHS PRN, Collene Gobble, MD .  sodium  chloride flush (NS) 0.9 % injection 3 mL, 3 mL, Intravenous, Q12H, Byrum, Rose Fillers, MD, 3 mL at 08/07/19 2203 .  zolpidem (AMBIEN) tablet 5 mg, 5 mg, Oral, QHS PRN, Collene Gobble, MD, 5 mg at 08/07/19 2203:  Allergies  Allergen Reactions  . Ciprocin-Fluocin-Procin [Fluocinolone]   . Minocycline Cough  . Sulfa Antibiotics Cough  . Levofloxacin Nausea And Vomiting and Anxiety  :  History reviewed. No pertinent family history.:  Social History   Socioeconomic History  . Marital status: Married    Spouse name: Not on file  . Number of children: Not on file  . Years of education: Not on file  . Highest education level: Not on file  Occupational History  . Not on file  Social Needs  . Financial resource strain: Not on file  . Food insecurity    Worry: Not on file    Inability: Not on file  . Transportation needs    Medical: Not on file    Non-medical: Not on file  Tobacco Use  . Smoking status: Former Smoker    Quit date: 07/20/2019    Years since quitting: 0.0  . Smokeless tobacco: Never Used  Substance and Sexual Activity  . Alcohol use: Never    Frequency: Never  . Drug use: Never  . Sexual activity: Not on file  Lifestyle  . Physical activity    Days per week: Not on file    Minutes per session: Not on file  . Stress: Not on file  Relationships  . Social Herbalist on phone: Not on file    Gets together: Not on file    Attends religious service: Not on file    Active member of club or organization: Not on file    Attends meetings of clubs or organizations: Not on file    Relationship status: Not on file  . Intimate partner violence    Fear of current or ex partner: Not on file    Emotionally abused: Not on file    Physically abused: Not on file    Forced sexual activity: Not on file  Other Topics Concern  . Not on file  Social History Narrative  . Not on file  :  Pertinent items are noted in HPI.  Exam: Blood pressure 114/62, pulse (!) 112,  temperature 98.7 F (37.1 C), temperature source Oral, resp. rate (!) 22, height 5' 7"  (1.702 m), weight 155 lb (70.3 kg), SpO2 93 %.  ECOG 1 General appearance: Sleepy but easily arousable.  cooperative appeared without distress. Head: atraumatic without any abnormalities. Eyes: conjunctivae/corneas clear. PERRL.  Sclera anicteric. Throat: lips, mucosa, and tongue normal; without oral thrush or ulcers. Resp: Expiratory wheezes with decreased breath sounds on the right. Cardio: regular rate and rhythm, S1, S2 normal, no murmur, click, rub or gallop GI: soft, non-tender; bowel sounds normal; no masses,  no organomegaly Skin: Skin color, texture, turgor normal.  No rashes or lesions Lymph nodes: Cervical, supraclavicular, and axillary nodes normal. Neurologic: Grossly normal without any motor, sensory or deep tendon reflexes. Musculoskeletal: No joint deformity or effusion.  Recent Labs    08/08/19 0523 08/09/19 0556  WBC 15.7* 15.7*  HGB 12.9 13.0  HCT 40.1 40.5  PLT 274 274   Recent Labs    08/08/19 0523 08/09/19 0556  NA 135 132*  K 3.8 3.9  CL 104 101  CO2 22 22  GLUCOSE 111* 122*  BUN 11 10  CREATININE 0.68 0.71  CALCIUM 7.7* 7.6*     Dg Chest 1 View  Result Date: 08/08/2019 CLINICAL DATA:  Post right thoracentesis EXAM: CHEST  1 VIEW COMPARISON:  08/07/2019 FINDINGS: Heart is normal size. Consolidation in the right lower lung with right pleural effusion. No pneumothorax following thoracentesis. Left lung clear. No acute bony abnormality. Heart is normal size. IMPRESSION: Continued right lower lung airspace opacity with small to moderate right effusion. No pneumothorax following thoracentesis. Electronically Signed   By: Rolm Baptise M.D.   On: 08/08/2019 10:26   Ct Angio Chest Pe W/cm &/or Wo Cm  Result Date: 08/07/2019 CLINICAL DATA:  Shortness of breath and hypoxia. Recent pneumonia. EXAM: CT ANGIOGRAPHY CHEST WITH CONTRAST TECHNIQUE: Multidetector CT imaging of  the chest was performed using the standard protocol during bolus administration of intravenous contrast. Multiplanar CT image reconstructions and MIPs were obtained to evaluate the vascular anatomy. CONTRAST:  127m OMNIPAQUE IOHEXOL 350 MG/ML SOLN COMPARISON:  06/10/2018 FINDINGS: Cardiovascular: Satisfactory opacification of pulmonary arteries noted, and no pulmonary emboli identified. No evidence of thoracic aortic dissection or aneurysm. Aortic atherosclerosis. Mediastinum/Nodes: New bulky lymphadenopathy is seen throughout the right paratracheal and subcarinal regions, with subcarinal soft tissue density measuring up to 4.2 cm short axis. Right hilar lymphadenopathy is also seen measuring at least 2.2 cm in short axis. Obstruction of the central right lower lobe bronchus and central low-attenuation is suspicious for centrally obstructing mass. Lungs/Pleura: There is near complete right lower lobe consolidation, suspicious for postobstructive pneumonitis. A moderate right pleural effusion is also seen. Several bilateral upper lobe and right middle lobe pulmonary nodules are seen measuring up to 5 mm which are stable. Mild emphysema again noted.33 Upper abdomen: No acute findings. Musculoskeletal: No suspicious bone lesions identified. Review of the MIP images confirms the above findings. IMPRESSION: No evidence of pulmonary embolism. Obstruction of central right lower lobe bronchus and central low-attenuation, suspicious for centrally obstructing mass. Consider further evaluation with bronchoscopy. Near complete right lower lobe consolidation, suspicious for postobstructive pneumonitis. Moderate right pleural effusion. Consider diagnostic thoracentesis. New bulky mediastinal and right hilar lymphadenopathy, consistent with metastatic disease. Stable sub-cm bilateral upper lobe and right middle lobe pulmonary nodules. Aortic Atherosclerosis (ICD10-I70.0) and Emphysema (ICD10-J43.9). Electronically Signed   By:  JMarlaine HindM.D.   On: 08/07/2019 19:23     Assessment and Plan:   68year old with:  1.  Lung mass suspicious for primary lung neoplasm noted on CT scan on August 07, 2019.  She presented with shortness of breath, cough and found to have pleural effusion as well as obstruction of the central right lower lobe bronchus likely indicating a centrally obstructing mass.  She did have lymphadenopathy involving bulky mediastinal, hilar as well as carinal involvement.  These findings with the patient today and and likely we are dealing with primary lung neoplasm.  Possibility of small cell versus non-small cell lung cancer as well as the myriad of treatment options were reviewed.  This does not to appear to be an operable lung malignancy and likely would require further staging and possibly palliative treatment.  We have discussed the role of palliative treatment for incurable cancer which include radiation therapy, chemotherapy or oral targeted therapy depending on the molecular studies to be performed on her biopsy.    Once we have more information regarding the exact pathology we will communicate further instructions regarding the management of her disease.  If we are dealing with small cell lung cancer, treatment with systemic chemotherapy will be done sooner than later while she is hospitalized.  If we are dealing with the non-small cell lung cancer then will await to further molecular testing before any additional treatment will take place.  I have recommended obtaining a CT scan of the abdomen and pelvis to complete the staging while she is hospitalized.  And once we have these results I will discuss it with her and with her husband at that time.  2.  Shortness of breath cough: Related to her malignancy.  She did have thoracentesis which have improved her breathing and might require repeat paracentesis if needed.  3.  Follow-up and disposition: We will continue to to follow with you while she is  hospitalized.  We will arrange a follow-up upon her discharge to continue ongoing oncology care.  80  minutes was spent with the patient face-to-face today.  More than 50% of time was dedicated to reviewing imaging studies, differential diagnosis, treatment options and discussing future plan of care.

## 2019-08-09 NOTE — Anesthesia Procedure Notes (Signed)
Procedure Name: Intubation Date/Time: 08/09/2019 11:05 AM Performed by: Mitzie Na, CRNA Pre-anesthesia Checklist: Patient identified, Emergency Drugs available, Suction available and Patient being monitored Patient Re-evaluated:Patient Re-evaluated prior to induction Oxygen Delivery Method: Circle system utilized Preoxygenation: Pre-oxygenation with 100% oxygen Induction Type: IV induction and Rapid sequence Laryngoscope Size: Mac and 3 Grade View: Grade I Tube type: Oral Tube size: 8.5 mm Number of attempts: 1 Airway Equipment and Method: Stylet and Oral airway Placement Confirmation: ETT inserted through vocal cords under direct vision,  positive ETCO2 and breath sounds checked- equal and bilateral Secured at: 23 cm Tube secured with: Tape Dental Injury: Teeth and Oropharynx as per pre-operative assessment

## 2019-08-09 NOTE — Interval H&P Note (Signed)
PCCM Interval Note  Patient continues to have cough, some right back pain.  No other new issues reported.  Vitals:   08/09/19 0519 08/09/19 0650 08/09/19 0700 08/09/19 0958  BP: 91/77   (!) 109/56  Pulse: (!) 104 90 90 100  Resp: (!) 22 (!) 21 20 15   Temp: 99.5 F (37.5 C)   98.9 F (37.2 C)  TempSrc: Oral   Oral  SpO2: 94%   95%  Weight:      Height:      Thin chronically ill appearing woman.  She not coughing as much today.  She has very decreased breath sounds on the right, clear on the left.  Heart regular without a murmur.  Abdomen is soft, nondistended with positive bowel sounds.  No significant lower extremity edema.  Presents today for further evaluation of her presumed postobstructive right lower lobe pneumonia, suspected endobronchial lesion with right hilar, subcarinal and mediastinal bulky lymphadenopathy.  Plan is for bronchoscopy with endobronchial ultrasound, biopsies to achieve a tissue diagnosis.  The procedure was discussed with the patient in full, risks and benefits.  She understands and agrees to proceed.  All questions answered.  No barriers identified.  Baltazar Apo, MD, PhD 08/09/2019, 10:15 AM [facility] [phone] or if no answer [phone]

## 2019-08-09 NOTE — Progress Notes (Signed)
PROGRESS NOTE   Jordan Clay  HAL:[id number]    DOB: 1951-05-16    DOA: 08/07/2019  PCP: Hulan Fess, MD   I have briefly reviewed patients previous medical records in [facility].  Chief Complaint  Patient presents with   COPD   low sats    Brief Narrative:  68 year old female with PMH of, tobacco abuse, HLD, presented to the ED on 10/19 due to cough and progressive dyspnea of 2 weeks duration.  She was recently treated by her PCP for pneumonia but symptoms persisted despite this.  CT chest was negative for pulmonary embolism but showed mass in the right lower lobe with obstruction and bulky chest lymphadenopathy and right pleural effusion.  IR and pulmonology consulted.  S/p right thoracentesis 700 mL.  S/p bronchoscopy 10/21 that showed right lower lobe mass with hilar mediastinal adenopathy, biopsies obtained and pathology pending.  Medical oncology consulted for suspected primary lung cancer.   Assessment & Plan:   Principal Problem:   Mass of right lung Active Problems:   COPD (chronic obstructive pulmonary disease) (HCC)   Postobstructive pneumonia   Pleural effusion on right   Prolonged QT interval   Lung mass suspicious for primary lung cancer/malignant pleural effusion/postobstructive pneumonia  Chest x-ray 10/19: Progression of RLL infiltrate and probable right pleural effusion.  Probable pneumonia.  CT chest recommended.  CTA chest 10/19: No PE.  Obstruction of central right lower lobe bronchus suspicious for centrally obstructing mass.  Near complete RLL consolidation, suspicious for postobstructive pneumonitis.  Moderate right pleural effusion.  New bulky mediastinal and right hilar lymphadenopathy consistent with metastatic disease.  S/p ultrasound-guided right thoracentesis 700 mL by IR on 10/20.  Cytopathology pending.  S/p bronchoscopy with detail findings as below, biopsy and brushings-follow-up results.  Oncology input appreciated.  Suspecting  primary lung cancer.  Does not appear to be operable.  Would require further staging and possible palliative treatment including radiation, chemotherapy and other therapy.  If it turns out to be small cell lung cancer, they recommend systemic chemotherapy probably while hospitalized.  CT abdomen and pelvis for staging.  Ongoing intractable cough and dyspnea, secondary to above.  Supportive treatment.  Continue IV ceftriaxone and azithromycin for postobstructive pneumonia.  COPD  No clinical bronchospasm.  Continue as needed bronchodilators.  Tobacco abuse  Recently quit.  Cessation counseled.   DVT prophylaxis: SCDs Code Status: Full Family Communication: None at bedside Disposition: To be determined pending hospital course.   Consultants:  IR PCCM Medical oncology  Procedures:  Ultrasound-guided right thoracentesis by IR 10/20 Bronchoscopy by pulmonology on 10/21 with findings as below  Impression - Right lower lobe mass - Right hilar mass - Mediastinal mass - The airway examination of the left lung was normal. - A narrowing was found in the right middle lobe and in the right lower lobe. The lesion has a malignant appearance. - Endobronchial ultrasound was performed > enlarged nodes at 4R, 7, 10R, 11R. - A transbronchial needle aspiration was performed at 4R and 7. - Brushings were obtained from RLL bronchus. - Bronchoalveolar lavage was performed in RLL.  Antimicrobials:  IV ceftriaxone and azithromycin   Subjective: Patient seen this morning prior to procedure.  Ongoing dry hacking cough.  Headache from constant coughing.  Intermittent dyspnea but better compared to admission  Objective:  Vitals:   08/09/19 1207 08/09/19 1210 08/09/19 1220 08/09/19 1227  BP: (!) 145/74 (!) 159/81 (!) 98/58 114/62  Pulse: (!) 111 (!) 108 (!) 116 Marland Kitchen)  112  Resp: 18 (!) 28 (!) 23 (!) 22  Temp: 98.7 F (37.1 C)     TempSrc: Oral     SpO2: 96% 94% 93% 93%  Weight:        Height:        Examination:  General exam: Middle-age female, moderately built and nourished lying propped up in bed with intermittent bouts of hacking cough. Respiratory system: Reduced breath sounds right > left.  No wheezing or rhonchi.  No increased work of breathing. Cardiovascular system: S1 & S2 heard, RRR. No JVD, murmurs, rubs, gallops or clicks. No pedal edema.  Telemetry personally reviewed: Sinus rhythm. Gastrointestinal system: Abdomen is nondistended, soft and nontender. No organomegaly or masses felt. Normal bowel sounds heard. Central nervous system: Alert and oriented. No focal neurological deficits. Extremities: Symmetric 5 x 5 power. Skin: No rashes, lesions or ulcers Psychiatry: Judgement and insight appear normal. Mood & affect appropriate.     Data Reviewed: I have personally reviewed following labs and imaging studies  CBC: Recent Labs  Lab 08/07/19 1556 08/08/19 0523 08/09/19 0556  WBC 19.3* 15.7* 15.7*  NEUTROABS 15.1* 11.9*  --   HGB 14.6 12.9 13.0  HCT 45.4 40.1 40.5  MCV 93.2 94.4 93.1  PLT 301 274 096   Basic Metabolic Panel: Recent Labs  Lab 08/07/19 1556 08/08/19 0523 08/09/19 0556  NA 136 135 132*  K 3.6 3.8 3.9  CL 103 104 101  CO2 21* 22 22  GLUCOSE 128* 111* 122*  BUN _0 CREATININE 0.97 0.68 0.71  CALCIUM 8.4* 7.7* 7.6*  MG  --  2.2 2.1   Liver Function Tests: Recent Labs  Lab 08/07/19 1556 08/09/19 0556  AST 23 21  ALT 29 32  ALKPHOS 63 48  BILITOT 0.8 0.7  PROT 5.9* 5.1*  ALBUMIN 2.8* 2.3*    Cardiac Enzymes: No results for input(s): CKTOTAL, CKMB, CKMBINDEX, TROPONINI in the last 168 hours.  CBG: Recent Labs  Lab 08/09/19 0749  GLUCAP 115*    Recent Results (from the past 240 hour(s))  Novel Coronavirus, NAA ([facility])     Status: None   Collection Time: 07/31/19 12:00 AM   Specimen: Nasopharyngeal(NP) swabs in vial transport medium   NASOPHARYNGE  TESTING  Result Value Ref Range Status    SARS-CoV-2, NAA Not Detected Not Detected Final    Comment: This nucleic acid amplification test was developed and its performance characteristics determined by [employer]. Nucleic acid amplification tests include PCR and TMA. This test has not been FDA cleared or approved. This test has been authorized by FDA under an Emergency Use Authorization (EUA). This test is only authorized for the duration of time the declaration that circumstances exist justifying the authorization of the emergency use of in vitro diagnostic tests for detection of SARS-CoV-2 virus and/or diagnosis of COVID-19 infection under section 564(b)(1) of the Act, 21 U.S.C. 283MOQ-9(U) (1), unless the authorization is terminated or revoked sooner. When diagnostic testing is negative, the possibility of a false negative result should be considered in the context of a patient's recent exposures and the presence of clinical signs and symptoms consistent with COVID-19. An individual without symptoms of COVID-19 and who is not shedding SARS-CoV-2 virus would  expect to have a negative (not detected) result in this assay.   Urine culture     Status: Abnormal   Collection Time: 08/07/19  3:57 PM   Specimen: Urine, Random  Result Value Ref Range Status  Specimen Description   Final    URINE, RANDOM Performed at [facility], [place] [address] [place], [place] [address]    Special Requests   Final    NONE Performed at [facility], [place] [address] [place], [place] [address]    Culture (A)  Final    <10,000 COLONIES/mL INSIGNIFICANT GROWTH Performed at [place] [address] [place], [place] [address]    Report Status 08/08/2019 FINAL  Final  Culture, blood (routine x 2)     Status: None (Preliminary result)   Collection Time: 08/07/19  3:57 PM   Specimen: BLOOD  Result Value Ref Range Status   Specimen Description   Final    BLOOD LEFT ANTECUBITAL Performed  at [place] [address], [place], [place] [address]    Special Requests   Final    BOTTLES DRAWN AEROBIC AND ANAEROBIC Blood Culture results may not be optimal due to an inadequate volume of blood received in culture bottles Performed at [place] [address] [place], [place] [address]    Culture   Final    NO GROWTH 2 DAYS Performed at [place] [address], [place], [place] [address]    Report Status PENDING  Incomplete  Culture, blood (routine x 2)     Status: None (Preliminary result)   Collection Time: 08/07/19  4:39 PM   Specimen: BLOOD  Result Value Ref Range Status   Specimen Description   Final    BLOOD LEFT ANTECUBITAL Performed at [place] [address], [place], [place] [address]    Special Requests   Final    BOTTLES DRAWN AEROBIC AND ANAEROBIC Blood Culture adequate volume Performed at [place] [address] [place], [place] [address]    Culture   Final    NO GROWTH 2 DAYS Performed at [place] [address] [place], [place] [address]    Report Status PENDING  Incomplete  SARS CORONAVIRUS 2 (TAT 6-24 HRS) Nasopharyngeal Nasopharyngeal Swab     Status: None   Collection Time: 08/07/19  4:39 PM   Specimen: Nasopharyngeal Swab  Result Value Ref Range Status   SARS Coronavirus 2 NEGATIVE NEGATIVE Final    Comment: (NOTE) SARS-CoV-2 target nucleic acids are NOT DETECTED. The SARS-CoV-2 RNA is generally detectable in upper and lower respiratory specimens during the acute phase of infection. Negative results do not preclude SARS-CoV-2 infection, do not rule out co-infections with other pathogens, and should not be used as the sole basis for treatment or other patient management decisions. Negative results must be combined with clinical observations, patient history, and epidemiological information. The expected result is Negative. Fact  Sheet for Patients: SugarRoll.be Fact Sheet for Healthcare Providers: https://www.woods-mathews.com/ This test is not yet approved or cleared by the [place] FDA and  has been authorized for detection and/or diagnosis of SARS-CoV-2 by FDA under an Emergency Use Authorization (EUA). This EUA will remain  in effect (meaning this test can be used) for the duration of the COVID-19 declaration under Section 56 4(b)(1) of the Act, 21 U.S.C. section 360bbb-3(b)(1), unless the authorization is terminated or revoked sooner. Performed at [facility], [place] [address] [place], [place] [address]   Culture, body fluid-bottle     Status: None (Preliminary result)   Collection Time: 08/08/19 10:05 AM   Specimen: Pleura  Result Value Ref Range Status  Specimen Description PLEURAL  Final   Special Requests NONE  Final   Culture   Final    NO GROWTH < 24 HOURS Performed at [facility], [place] [address] [place], [place] [address]    Report Status PENDING  Incomplete  Gram stain     Status: None   Collection Time: 08/08/19 10:05 AM   Specimen: Pleura  Result Value Ref Range Status   Specimen Description PLEURAL  Final   Special Requests NONE  Final   Gram Stain   Final    MODERATE WBC PRESENT, PREDOMINANTLY MONONUCLEAR NO ORGANISMS SEEN Performed at [facility], [place] [address], [place], [place] [address]    Report Status 08/08/2019 FINAL  Final  Culture, bal-quantitative     Status: None (Preliminary result)   Collection Time: 08/09/19 11:59 AM   Specimen: Bronchoalveolar Lavage; Respiratory  Result Value Ref Range Status   Specimen Description   Final    BRONCHIAL ALVEOLAR LAVAGE RLL Performed at [place] [address] [place], [place] [address]    Special Requests   Final    Normal Performed at [facility], [place] [address], [place], [place] [address]    Gram Stain   Final     FEW WBC PRESENT,BOTH PMN AND MONONUCLEAR NO ORGANISMS SEEN Performed at [facility], [place] [address] [place], [place] [address]    Culture PENDING  Incomplete   Report Status PENDING  Incomplete         Radiology Studies: Dg Chest 1 View  Result Date: 08/08/2019 CLINICAL DATA:  Post right thoracentesis EXAM: CHEST  1 VIEW COMPARISON:  08/07/2019 FINDINGS: Heart is normal size. Consolidation in the right lower lung with right pleural effusion. No pneumothorax following thoracentesis. Left lung clear. No acute bony abnormality. Heart is normal size. IMPRESSION: Continued right lower lung airspace opacity with small to moderate right effusion. No pneumothorax following thoracentesis. Electronically Signed   By: Rolm Baptise M.D.   On: 08/08/2019 10:26   Ct Angio Chest Pe W/cm &/or Wo Cm  Result Date: 08/07/2019 CLINICAL DATA:  Shortness of breath and hypoxia. Recent pneumonia. EXAM: CT ANGIOGRAPHY CHEST WITH CONTRAST TECHNIQUE: Multidetector CT imaging of the chest was performed using the standard protocol during bolus administration of intravenous contrast. Multiplanar CT image reconstructions and MIPs were obtained to evaluate the vascular anatomy. CONTRAST:  197m OMNIPAQUE IOHEXOL 350 MG/ML SOLN COMPARISON:  06/10/2018 FINDINGS: Cardiovascular: Satisfactory opacification of pulmonary arteries noted, and no pulmonary emboli identified. No evidence of thoracic aortic dissection or aneurysm. Aortic atherosclerosis. Mediastinum/Nodes: New bulky lymphadenopathy is seen throughout the right paratracheal and subcarinal regions, with subcarinal soft tissue density measuring up to 4.2 cm short axis. Right hilar lymphadenopathy is also seen measuring at least 2.2 cm in short axis. Obstruction of the central right lower lobe bronchus and central low-attenuation is suspicious for centrally obstructing mass. Lungs/Pleura: There is near complete right lower lobe consolidation, suspicious for  postobstructive pneumonitis. A moderate right pleural effusion is also seen. Several bilateral upper lobe and right middle lobe pulmonary nodules are seen measuring up to 5 mm which are stable. Mild emphysema again noted.33 Upper abdomen: No acute findings. Musculoskeletal: No suspicious bone lesions identified. Review of the MIP images confirms the above findings. IMPRESSION: No evidence of pulmonary embolism. Obstruction of central right lower lobe bronchus and central low-attenuation, suspicious for centrally obstructing mass. Consider further evaluation with bronchoscopy. Near complete right lower lobe consolidation,  suspicious for postobstructive pneumonitis. Moderate right pleural effusion. Consider diagnostic thoracentesis. New bulky mediastinal and right hilar lymphadenopathy, consistent with metastatic disease. Stable sub-cm bilateral upper lobe and right middle lobe pulmonary nodules. Aortic Atherosclerosis (ICD10-I70.0) and Emphysema (ICD10-J43.9). Electronically Signed   By: Marlaine Hind M.D.   On: 08/07/2019 19:23   Dg Chest Port 1 View  Result Date: 08/08/2019 CLINICAL DATA:  Post thoracentesis ongoing shortness of breath and cough. EXAM: PORTABLE CHEST 1 VIEW COMPARISON:  08/08/2019 and 08/07/2019 FINDINGS: Right hilar and mediastinal mass with right lower lobe consolidation and mass similar to prior study. Likely with persistent pleural effusion. No visible pneumothorax. No significant mediastinal shift. No signs of acute bone finding. Left chest is clear. IMPRESSION: 1. No interval change in the appearance of the chest. No pneumothorax. 2. Right hilar and mediastinal mass and right lower lobe consolidation are similar to prior study. Postobstructive pneumonia is considered. Electronically Signed   By: Zetta Bills M.D.   On: 08/08/2019 14:19   US Thoracentesis Asp Pleural Space W/img Guide  Result Date: 08/08/2019 INDICATION: Shortness of breath. Newly found right sided lung mass with  pleural effusion. Request for diagnostic and therapeutic thoracentesis. EXAM: ULTRASOUND GUIDED RIGHT THORACENTESIS MEDICATIONS: None. COMPLICATIONS: None immediate. PROCEDURE: An ultrasound guided thoracentesis was thoroughly discussed with the patient and questions answered. The benefits, risks, alternatives and complications were also discussed. The patient understands and wishes to proceed with the procedure. Written consent was obtained. Ultrasound was performed to localize and mark an adequate pocket of fluid in the right chest. The area was then prepped and draped in the normal sterile fashion. 1% Lidocaine was used for local anesthesia. Under ultrasound guidance a 6 Fr Safe-T-Centesis catheter was introduced. Thoracentesis was performed. The catheter was removed and a dressing applied. FINDINGS: A total of approximately 700 mL of hazy, pale amber colored fluid was removed. Samples were sent to the laboratory as requested by the clinical team. IMPRESSION: Successful ultrasound guided right thoracentesis yielding 700 mL of pleural fluid. Read by: Ascencion Dike PA-C Electronically Signed   By: Sandi Mariscal M.D.   On: 08/08/2019 13:44        Scheduled Meds:  benzonatate  200 mg Oral TID   dextromethorphan  30 mg Oral BID   ipratropium-albuterol  3 mL Nebulization BID   sodium chloride flush  3 mL Intravenous Q12H   Continuous Infusions:  acetaminophen     azithromycin 500 mg (08/08/19 1600)   cefTRIAXone (ROCEPHIN)  IV 2 g (08/08/19 1523)     LOS: 2 days     Vernell Leep, MD, FACP, [facility]. [facility]  To contact the attending provider between 7A-7P or the covering provider during after hours 7P-7A, please log into the web site [email or web address] and access using universal [place] password for that web site. If you do not have the password, please call the hospital operator.  08/09/2019, 5:08 PM

## 2019-08-09 NOTE — Progress Notes (Signed)
   08/09/19 0650  MEWS Score  Resp (!) 21  Pulse Rate 90  MEWS Score  MEWS RR 1  MEWS Pulse 0  MEWS Systolic 1  MEWS LOC 0  MEWS Temp 0  MEWS Score 2  MEWS Score Color Yellow  MEWS Assessment  Is this an acute change? No   Pts vitals have remained WNL of pts baseline. Pt is on telemetry with no concerning changes. Will continue to monitor.

## 2019-08-09 NOTE — Transfer of Care (Signed)
Immediate Anesthesia Transfer of Care Note  Patient: Jordan Clay  Procedure(s) Performed: ENDOBRONCHIAL ULTRASOUND (Bilateral ) VIDEO BRONCHOSCOPY WITH FLUORO (N/A ) FINE NEEDLE ASPIRATION BIOPSY  Patient Location: PACU  Anesthesia Type:General  Level of Consciousness: sedated  Airway & Oxygen Therapy: Patient Spontanous Breathing and Patient connected to face mask oxygen  Post-op Assessment: Report given to RN and Post -op Vital signs reviewed and stable  Post vital signs: Reviewed and stable  Last Vitals:  Vitals Value Taken Time  BP 159/81 08/09/19 1210  Temp 37.1 C 08/09/19 1207  Pulse 114 08/09/19 1212  Resp 21 08/09/19 1212  SpO2 94 % 08/09/19 1212  Vitals shown include unvalidated device data.  Last Pain:  Vitals:   08/09/19 1207  TempSrc: Oral  PainSc: 0-No pain      Patients Stated Pain Goal: 0 (50/35/46 5681)  Complications: No apparent anesthesia complications

## 2019-08-09 NOTE — Op Note (Signed)
[facility] Cardiopulmonary Patient Name: Jordan Clay Procedure Date: 08/09/2019 MRN: [id number] Attending MD: Collene Gobble , MD Date of Birth: 11/10/1950 CSN: [id number] Age: 68 Admit Type: Inpatient Ethnicity: Not Hispanic or Latino Procedure:            Bronchoscopy Indications:          Right lower lobe mass, Right hilar mass, Mediastinal                        mass Providers:            Collene Gobble, MD, William Dalton, Technician,                        Cleda Daub RN , Phillis Knack RRT, RCP Referring MD:          Medicines:            General Anesthesia Complications:        No immediate complications Estimated Blood Loss: Estimated blood loss was minimal. Procedure:      Pre-Anesthesia Assessment:      - A History and Physical has been performed. Patient meds and allergies       have been reviewed. The risks and benefits of the procedure and the       sedation options and risks were discussed with the patient. All       questions were answered and informed consent was obtained. Patient       identification and proposed procedure were verified prior to the       procedure by the physician in the pre-procedure area. Mental Status       Examination: alert and oriented. Airway Examination: normal       oropharyngeal airway. Respiratory Examination: clear to auscultation in       the left lung and poor air movement in the right lung. CV Examination:       normal. ASA Grade Assessment: III - A patient with severe systemic       disease. After reviewing the risks and benefits, the patient was deemed       in satisfactory condition to undergo the procedure. The anesthesia plan       was to use general anesthesia. Immediately prior to administration of       medications, the patient was re-assessed for adequacy to receive       sedatives. The heart rate, respiratory rate, oxygen saturations, blood       pressure, adequacy of pulmonary ventilation, and  response to care were       monitored throughout the procedure. The physical status of the patient       was re-assessed after the procedure.      After obtaining informed consent, the bronchoscope was passed under       direct vision. Throughout the procedure, the patient's blood pressure,       pulse, and oxygen saturations were monitored continuously. the [id number]       ([id number] Olympus EBUS was introduced through the mouth, via the       endotracheal tube (the patient was intubated for the procedure) and       advanced to the tracheobronchial tree. The procedure was accomplished       without difficulty. The patient tolerated the procedure well. The total       duration of the procedure was 44 minutes. Findings:  The endotracheal tube is in good position. The trachea is of normal       caliber. The carina is sharp. The tracheobronchial tree of the left lung       was examined to at least the first subsegmental level. Bronchial mucosa       and anatomy in the left lung are normal; there are no endobronchial       lesions, and no secretions.      Right Lung Abnormalities: Narrowing was found in the right middle lobe       and in the right lower lobe airways at the distal BI. The mucousa was       irregular, hypopigmented and had a malignant appearance. The RLL airway       lumen is about 90% occluded with some yellow thin secretions eminating       from teh airway. The RML airway was 50% occluded, again with irregular       mucosa, The scope would traverse into the RML.      An endobronchial ultrasound endoscope was utilized in order to better       localize the mass and nodes in the right hilum, in the left hilum and in       the mediastinum. There was significant enlargement of nodes at 4R, 7,       10R, 11R. Possible enlargement of 11L, although it was difficult to       distinguish this from inferior aspect of 7.      Transbronchial needle aspirations of enlarged 7 and 11R  nodes were       performed and sent for routine cytology. Transbronchial needle       aspiration technique was selected because the sampling site was not       visible endoscopically. Five samples were obtained.      Brushings of a lesion were obtained in the right lower lobe with a       cytology brush and sent for routine cytology. Two samples were obtained.      Bronchoalveolar lavage was performed in the right lower lobe of the lung       and sent for aerobic culture and anaerobic culture. 30 mL of fluid were       instilled. 20 mL were returned. The return was mucopurulent. Impression:      - Right lower lobe mass      - Right hilar mass      - Mediastinal mass      - The airway examination of the left lung was normal.      - A narrowing was found in the right middle lobe and in the right lower       lobe. The lesion has a malignant appearance.      - Endobronchial ultrasound was performed > enlarged nodes at 4R, 7, 10R,       11R.      - A transbronchial needle aspiration was performed at 4R and 7.      - Brushings were obtained from RLL bronchus.      - Bronchoalveolar lavage was performed in RLL. Moderate Sedation:      Performed under general anesthesia Recommendation:      - Await BAL, brushing and cytology results. Procedure Code(s):      --- Professional ---      [phone], Bronchoscopy, rigid or flexible, including fluoroscopic guidance,       when performed; with transbronchial  needle aspiration biopsy(s),       trachea, main stem and/or lobar bronchus(i)      31624, Bronchoscopy, rigid or flexible, including fluoroscopic guidance,       when performed; with bronchial alveolar lavage      [phone], Bronchoscopy, rigid or flexible, including fluoroscopic guidance,       when performed; with brushing or protected brushings      31654, Bronchoscopy, rigid or flexible, including fluoroscopic guidance,       when performed; with transendoscopic endobronchial ultrasound (EBUS)        during bronchoscopic diagnostic or therapeutic intervention(s) for       peripheral lesion(s) (List separately in addition to code for primary       procedure[s]) Diagnosis Code(s):      --- Professional ---      R91.8, Other nonspecific abnormal finding of lung field      R93.89, Abnormal findings on diagnostic imaging of other specified body       structures      J98.4, Other disorders of lung CPT copyright 2019 [employer]. All rights reserved. The codes documented in this report are preliminary and upon coder review may  be revised to meet current compliance requirements. Collene Gobble, MD Collene Gobble, MD 08/09/2019 12:15:11 PM Number of Addenda: 0 Scope In: Scope Out:

## 2019-08-10 ENCOUNTER — Other Ambulatory Visit: Payer: Self-pay

## 2019-08-10 DIAGNOSIS — R918 Other nonspecific abnormal finding of lung field: Secondary | ICD-10-CM | POA: Diagnosis not present

## 2019-08-10 DIAGNOSIS — C799 Secondary malignant neoplasm of unspecified site: Secondary | ICD-10-CM

## 2019-08-10 LAB — COMPREHENSIVE METABOLIC PANEL
ALT: 25 U/L (ref 0–44)
AST: 17 U/L (ref 15–41)
Albumin: 2.1 g/dL — ABNORMAL LOW (ref 3.5–5.0)
Alkaline Phosphatase: 48 U/L (ref 38–126)
Anion gap: 9 (ref 5–15)
BUN: 9 mg/dL (ref 8–23)
CO2: 22 mmol/L (ref 22–32)
Calcium: 7.6 mg/dL — ABNORMAL LOW (ref 8.9–10.3)
Chloride: 100 mmol/L (ref 98–111)
Creatinine, Ser: 0.69 mg/dL (ref 0.44–1.00)
GFR calc Af Amer: >60 mL/min (ref >60)
GFR calc non Af Amer: >60 mL/min (ref >60)
Glucose, Bld: 108 mg/dL — ABNORMAL HIGH (ref 70–99)
Potassium: 4.1 mmol/L (ref 3.5–5.1)
Sodium: 131 mmol/L — ABNORMAL LOW (ref 135–145)
Total Bilirubin: 0.7 mg/dL (ref 0.3–1.2)
Total Protein: 5 g/dL — ABNORMAL LOW (ref 6.5–8.1)

## 2019-08-10 LAB — CBC
HCT: 41.8 % (ref 36.0–46.0)
Hemoglobin: 13.2 g/dL (ref 12.0–15.0)
MCH: 29.9 pg (ref 26.0–34.0)
MCHC: 31.6 g/dL (ref 30.0–36.0)
MCV: 94.8 fL (ref 80.0–100.0)
Platelets: 263 10*3/uL (ref 150–400)
RBC: 4.41 MIL/uL (ref 3.87–5.11)
RDW: 12.1 % (ref 11.5–15.5)
WBC: 16.8 10*3/uL — ABNORMAL HIGH (ref 4.0–10.5)
nRBC: 0 % (ref 0.0–0.2)

## 2019-08-10 LAB — MAGNESIUM: Magnesium: 1.9 mg/dL (ref 1.7–2.4)

## 2019-08-10 MED ORDER — HYDROCOD POLST-CPM POLST ER 10-8 MG/5ML PO SUER
5.0000 mL | Freq: Two times a day (BID) | ORAL | Status: DC | PRN
  Administered 2019-08-10 – 2019-08-12 (×5): 5 mL via ORAL
  Filled 2019-08-10 (×5): qty 5

## 2019-08-10 MED ORDER — SODIUM CHLORIDE 0.9 % IV SOLN
2.0000 g | INTRAVENOUS | Status: AC
  Administered 2019-08-10 – 2019-08-13 (×4): 2 g via INTRAVENOUS
  Filled 2019-08-10: qty 20
  Filled 2019-08-10 (×2): qty 2
  Filled 2019-08-10: qty 20

## 2019-08-10 NOTE — Progress Notes (Signed)
NAME:  Jordan Clay, MRN:  [id number], DOB:  10-17-1951, LOS: 3 ADMISSION DATE:  08/07/2019, CONSULTATION DATE:  10/20 REFERRING MD:  Reesa Chew (THR) , CHIEF COMPLAINT:  Cough, SOB  Brief History   68yo female smoker (40 pack years, quit 1 week ago when she got pneumonia) with hx COPD admitted 10/19 for postobstructive PNA.  History of present illness   68 year old female with history of COPD presented 10/19 to ER with cough and shortness of breath x2 weeks.  She was treated by her PCP for pneumonia but did not tolerate the antibiotics and was switched to [facility] 2 days prior to admission.  She continues to have severe cough and dyspnea despite this.  Cough is nonproductive.  Unsure if she has had fever.  Has not slept well 1 weeks related to persistent cough.  No sick contacts that she is aware of.  Past Medical History   has a past medical history of COPD (chronic obstructive pulmonary disease) ([place]), High cholesterol, and Pneumonia.   Significant Hospital Events     Consults:  PCCM 10/20>>>  Procedures:  IR R thoracentesis 10/20 >>  Significant Diagnostic Tests:  CTA chest 10/19>>> No evidence of pulmonary embolism.  Obstruction of central right lower lobe bronchus and central low-attenuation, suspicious for centrally obstructing mass.  Near complete right lower lobe consolidation, suspicious for postobstructive pneumonitis. Moderate right pleural effusion.  New bulky mediastinal and right hilar lymphadenopathy, consistent with metastatic disease.  Stable sub-cm bilateral upper lobe and right middle lobe pulmonary nodules.  Micro Data:  BC x 2 10/19>>>  COVID 10/19>>> neg   Antimicrobials:  Rocephin 10/19>>> azithro 10/19>>>  Interim history/subjective:  Feels a little better today.  Actually slept last night for the first time in weeks after receiving Ambien.  Still has persistent cough.  Going down to radiology for thoracentesis now.  Objective   Blood pressure 105/62,  pulse (!) 109, temperature 98.6 F (37 C), temperature source Oral, resp. rate 17, height 5\' 7"  (1.702 m), weight 70.3 kg, SpO2 94 %.        Intake/Output Summary (Last 24 hours) at 08/10/2019 1459 Last data filed at 08/10/2019 0600 Gross per 24 hour  Intake 294.62 ml  Output -  Net 294.62 ml   Filed Weights   08/07/19 1504  Weight: 70.3 kg    Examination: General: Pleasant female, no acute distress although she does have persistent/near constant cough HENT: Mucous membranes moist, no JVD, no appreciable cervical lymphadenopathy Lungs: Respirations are even, nonlabored on 2 L nasal cannula, persistent dry cough, no obvious dyspnea, diminished right Cardiovascular: S1-S2, RRR Abdomen: Soft, nontender Extremities: Warm and dry, no edema Neuro: Awake, alert, moves all extremities  Resolved Hospital Problem list     Assessment & Plan:  Right lower lobe and hilar mass with associated lymphadenopathy.  Post bronchoscopy with endobronchial ultrasound on 10/21.  Cytology pending. -Appreciate oncology evaluation.  Suspect that she will need at least palliative treatment quickly given her partial right lower lobe bronchial obstruction.  Cough. -Continue cough suppression efforts at least until we can try to address the anatomical abnormalities that are contributing to the cough.    Labs   CBC: Recent Labs  Lab 08/07/19 1556 08/08/19 0523 08/09/19 0556 08/10/19 0557  WBC 19.3* 15.7* 15.7* 16.8*  NEUTROABS 15.1* 11.9*  --   --   HGB 14.6 12.9 13.0 13.2  HCT 45.4 40.1 40.5 41.8  MCV 93.2 94.4 93.1 94.8  PLT 301 274 274 263  Basic Metabolic Panel: Recent Labs  Lab 08/07/19 1556 08/08/19 0523 08/09/19 0556 08/10/19 0557  NA 136 135 132* 131*  K 3.6 3.8 3.9 4.1  CL 103 104 101 100  CO2 21* 22 22 22   GLUCOSE 128* 111* 122* 108*  BUN 15 11 10 9   CREATININE 0.97 0.68 0.71 0.69  CALCIUM 8.4* 7.7* 7.6* 7.6*  MG  --  2.2 2.1 1.9   GFR: Estimated Creatinine  Clearance: 66.4 mL/min (by C-G formula based on SCr of 0.69 mg/dL). Recent Labs  Lab 08/07/19 1556 08/07/19 1557 08/08/19 0523 08/09/19 0556 08/10/19 0557  PROCALCITON  --   --   --  <0.10  --   WBC 19.3*  --  15.7* 15.7* 16.8*  LATICACIDVEN  --  1.8  --   --   --     Liver Function Tests: Recent Labs  Lab 08/07/19 1556 08/09/19 0556 08/10/19 0557  AST 23 21 17   ALT 29 32 25  ALKPHOS 63 48 48  BILITOT 0.8 0.7 0.7  PROT 5.9* 5.1* 5.0*  ALBUMIN 2.8* 2.3* 2.1*   No results for input(s): LIPASE, AMYLASE in the last 168 hours. No results for input(s): AMMONIA in the last 168 hours.  ABG No results found for: PHART, PCO2ART, PO2ART, HCO3, TCO2, ACIDBASEDEF, O2SAT   Coagulation Profile: Recent Labs  Lab 08/08/19 1200  INR 1.0    Cardiac Enzymes: No results for input(s): CKTOTAL, CKMB, CKMBINDEX, TROPONINI in the last 168 hours.  HbA1C: No results found for: HGBA1C  CBG: Recent Labs  Lab 08/09/19 [place]     Baltazar Apo, MD, PhD 08/10/2019, 2:59 PM [facility] [phone] or if no answer [phone]

## 2019-08-10 NOTE — Progress Notes (Signed)
Patient seen and examined today.  She continues to be rather debilitated and exhausted and reports some cough that is improved with codeine-based cough suppressant.  She still have some shortness of breath but has been limited in her intake at this time.  She has not moved out of bed at this time.  Vitals reviewed today and she remains afebrile with blood pressure is stable.  Laboratory data were also reviewed which showed normal hemoglobin with mild leukocytosis.  Kidney function and hepatic function all within normal range.  CT scan of the abdomen results were discussed with her husband and son who are present today at bedside.  The final pathology is still pending but these findings are highly suspicious for malignancy.  The possibility of a small cell lung cancer versus non-small cell lung cancer were reviewed today.  We also discussed other treatment options specifically if we are dealing with small cell lung cancer.  Given her debilitated state urgent need for systemic chemotherapy will be needed if we are indeed dealing with small cell lung cancer.  Complication associated with chemotherapy was discussed with nausea, vomiting, myelosuppression, neutropenia and neutropenic sepsis.  The response rate is rather high associated with small cell lung cancer and although the chemotherapy will not be curative will offer her reasonable palliation of symptoms.  If we are dealing with non-small cell lung cancer then further testing will be needed including molecular testing for EGFR among other actionable mutation that could dictate treatment options.  Also PD-L1 status can dictate the addition of immunotherapy in addition to chemotherapy.  Treatment at this time will be an outpatient basis if we are dealing with non-small cell lung cancer.  We also discussed the prognosis in detail today.  All these treatments are palliative at this time as I do not feel any curative options do exist.  She appears to have  advanced disease with pleural effusion and diffuse adenopathy.  She also have abdominal adenopathy which could indicate stage IV disease.  All other questions were answered today and we will continue to follow closely and then the results of her recent pathology.  Palliative radiation therapy to the chest which could also be a possibility to help improve aeration in the lung especially if we are dealing with non-small cell lung cancer.   We will continue to follow.   25  minutes was spent with the patient face-to-face today.  More than 50% of time was dedicated to reviewing CT scan results, differential diagnosis and management options.

## 2019-08-10 NOTE — Progress Notes (Signed)
PROGRESS NOTE   Jordan Clay  PIR:[id number]    DOB: 1951/08/20    DOA: 08/07/2019  PCP: Hulan Fess, MD   I have briefly reviewed patients previous medical records in [facility].  Chief Complaint  Patient presents with  . COPD  . low sats    Brief Narrative:  68 year old female with PMH of, tobacco abuse, HLD, presented to the ED on 10/19 due to cough and progressive dyspnea of 2 weeks duration.  She was recently treated by her PCP for pneumonia but symptoms persisted despite this.  CT chest was negative for pulmonary embolism but showed mass in the right lower lobe with obstruction and bulky chest lymphadenopathy and right pleural effusion.  IR and pulmonology consulted.  S/p right thoracentesis 700 mL.  S/p bronchoscopy 10/21 that showed right lower lobe mass with hilar mediastinal adenopathy, biopsies obtained and pathology pending.  Medical oncology consulted for suspected primary lung cancer.   Assessment & Plan:   Principal Problem:   Mass of right lung Active Problems:   COPD (chronic obstructive pulmonary disease) (HCC)   Postobstructive pneumonia   Pleural effusion on right   Prolonged QT interval   Lung mass suspicious for primary lung cancer/malignant pleural effusion/postobstructive pneumonia  Chest x-ray 10/19: Progression of RLL infiltrate and probable right pleural effusion.  Probable pneumonia.  CT chest recommended.  CTA chest 10/19: No PE.  Obstruction of central right lower lobe bronchus suspicious for centrally obstructing mass.  Near complete RLL consolidation, suspicious for postobstructive pneumonitis.  Moderate right pleural effusion.  New bulky mediastinal and right hilar lymphadenopathy consistent with metastatic disease.  S/p ultrasound-guided right thoracentesis 700 mL by IR on 10/20.  Cytopathology pending.  S/p bronchoscopy with detail findings as below, biopsy and brushings-follow-up results.  Oncology input appreciated.  Suspecting  primary lung cancer.  Does not appear to be operable.  Would require further staging and possible palliative treatment including radiation, chemotherapy and other therapy.  If it turns out to be small cell lung cancer, they recommend systemic chemotherapy probably while hospitalized.  CT abdomen and pelvis for staging.  Ongoing intractable cough and dyspnea, secondary to above.  Supportive treatment.  Added Tussionex due to uncontrolled cough.  Continue IV ceftriaxone and azithromycin for postobstructive pneumonia.  Patient had developed some redness at IV site yesterday but unsure if this was due to IV infiltration or reaction.  Findings have now resolved.  Patient willing to try IV ceftriaxone again and monitor closely for any reaction.  CT abdomen shows lymphadenopathy concerning for stage IV cancer.  Oncology and pulmonary input appreciated.  Awaiting pathology results to determine further course of action.  Acute respiratory failure with hypoxia  Secondary to suspected lung cancer, postobstructive pneumonia and pleural effusion.  Treat underlying cause.  Oxygen supplementation.   COPD  No clinical bronchospasm.  Continue as needed bronchodilators.  Tobacco abuse  Recently quit.  Cessation counseled.   DVT prophylaxis: SCDs Code Status: Full Family Communication: Discussed in detail with patient spouse at bedside, updated care and answered questions.  Discussed with them with patient's RN in room. Disposition: To be determined pending hospital course.   Consultants:  IR PCCM Medical oncology  Procedures:  Ultrasound-guided right thoracentesis by IR 10/20 Bronchoscopy by pulmonology on 10/21 with findings as below  Impression - Right lower lobe mass - Right hilar mass - Mediastinal mass - The airway examination of the left lung was normal. - A narrowing was found in the right middle lobe  and in the right lower lobe. The lesion has a malignant appearance. -  Endobronchial ultrasound was performed > enlarged nodes at 4R, 7, 10R, 11R. - A transbronchial needle aspiration was performed at 4R and 7. - Brushings were obtained from RLL bronchus. - Bronchoalveolar lavage was performed in RLL.  Antimicrobials:  IV ceftriaxone and azithromycin   Subjective: Ongoing mostly dry hacking cough, not controlled with current measures.  Chest pain on coughing.  Some dyspnea.  Objective:  Vitals:   08/09/19 2200 08/10/19 0557 08/10/19 0854 08/10/19 1516  BP:  105/62  110/67  Pulse: 90 97 (!) 109 (!) 105  Resp: _0 Temp:  98.6 F (37 C)  98.4 F (36.9 C)  TempSrc:  Oral  Oral  SpO2:  96% 94% 97%  Weight:      Height:        Examination:  General exam: Middle-age female, moderately built and nourished lying propped up in bed, intermittently uncomfortable with coughing spells. Respiratory system: Reduced breath sounds right > left.  No wheezing or rhonchi appreciated.  Harsh breath sounds bilaterally.  No increased work of breathing. Cardiovascular system: S1 & S2 heard, RRR. No JVD, murmurs, rubs, gallops or clicks. No pedal edema.  Telemetry personally reviewed: SR-mild ST in the 100s. Gastrointestinal system: Abdomen is nondistended, soft and nontender. No organomegaly or masses felt. Normal bowel sounds heard. Central nervous system: Alert and oriented. No focal neurological deficits. Extremities: Symmetric 5 x 5 power. Skin: No rashes, lesions or ulcers Psychiatry: Judgement and insight appear normal. Mood & affect appropriate.     Data Reviewed: I have personally reviewed following labs and imaging studies  CBC: Recent Labs  Lab 08/07/19 1556 08/08/19 0523 08/09/19 0556 08/10/19 0557  WBC 19.3* 15.7* 15.7* 16.8*  NEUTROABS 15.1* 11.9*  --   --   HGB 14.6 12.9 13.0 13.2  HCT 45.4 40.1 40.5 41.8  MCV 93.2 94.4 93.1 94.8  PLT 301 274 274 735   Basic Metabolic Panel: Recent Labs  Lab 08/07/19 1556 08/08/19 0523 08/09/19  0556 08/10/19 0557  NA 136 135 132* 131*  K 3.6 3.8 3.9 4.1  CL 103 104 101 100  CO2 21* _1 GLUCOSE 128* 111* 122* 108*  BUN _2 CREATININE 0.97 0.68 0.71 0.69  CALCIUM 8.4* 7.7* 7.6* 7.6*  MG  --  2.2 2.1 1.9   Liver Function Tests: Recent Labs  Lab 08/07/19 1556 08/09/19 0556 08/10/19 0557  AST _3 ALT 29 32 25  ALKPHOS 63 48 48  BILITOT 0.8 0.7 0.7  PROT 5.9* 5.1* 5.0*  ALBUMIN 2.8* 2.3* 2.1*    Cardiac Enzymes: No results for input(s): CKTOTAL, CKMB, CKMBINDEX, TROPONINI in the last 168 hours.  CBG: Recent Labs  Lab 08/09/19 0749  GLUCAP 115*    Recent Results (from the past 240 hour(s))  Urine culture     Status: Abnormal   Collection Time: 08/07/19  3:57 PM   Specimen: Urine, Random  Result Value Ref Range Status   Specimen Description   Final    URINE, RANDOM Performed at [facility] [address] [place], [place] [address]    Special Requests   Final    NONE Performed at [facility], [place] [address], [place], [place] [address]    Culture (A)  Final    <10,000 COLONIES/mL INSIGNIFICANT GROWTH Performed at [facility]  [address] [place], [place] [address]    Report Status 08/08/2019 FINAL  Final  Culture, blood (routine x 2)     Status: None (Preliminary result)   Collection Time: 08/07/19  3:57 PM   Specimen: BLOOD  Result Value Ref Range Status   Specimen Description   Final    BLOOD LEFT ANTECUBITAL Performed at [facility] [address], [place], [place] [address]    Special Requests   Final    BOTTLES DRAWN AEROBIC AND ANAEROBIC Blood Culture results may not be optimal due to an inadequate volume of blood received in culture bottles Performed at [facility] [address], [place], [place] [address]    Culture   Final    NO GROWTH 3 DAYS Performed at [facility], [place] [address] [place], [place] [address]     Report Status PENDING  Incomplete  Culture, blood (routine x 2)     Status: None (Preliminary result)   Collection Time: 08/07/19  4:39 PM   Specimen: BLOOD  Result Value Ref Range Status   Specimen Description   Final    BLOOD LEFT ANTECUBITAL Performed at [facility] [address] [place], [place] [address]    Special Requests   Final    BOTTLES DRAWN AEROBIC AND ANAEROBIC Blood Culture adequate volume Performed at [facility] [address], [place], [place] [address]    Culture   Final    NO GROWTH 3 DAYS Performed at [facility], [place] [address], [place], [place] [address]    Report Status PENDING  Incomplete  SARS CORONAVIRUS 2 (TAT 6-24 HRS) Nasopharyngeal Nasopharyngeal Swab     Status: None   Collection Time: 08/07/19  4:39 PM   Specimen: Nasopharyngeal Swab  Result Value Ref Range Status   SARS Coronavirus 2 NEGATIVE NEGATIVE Final    Comment: (NOTE) SARS-CoV-2 target nucleic acids are NOT DETECTED. The SARS-CoV-2 RNA is generally detectable in upper and lower respiratory specimens during the acute phase of infection. Negative results do not preclude SARS-CoV-2 infection, do not rule out co-infections with other pathogens, and should not be used as the sole basis for treatment or other patient management decisions. Negative results must be combined with clinical observations, patient history, and epidemiological information. The expected result is Negative. Fact Sheet for Patients: SugarRoll.be Fact Sheet for Healthcare Providers: https://www.woods-mathews.com/ This test is not yet approved or cleared by the [place] FDA and  has been authorized for detection and/or diagnosis of SARS-CoV-2 by FDA under an Emergency Use Authorization (EUA). This EUA will remain  in effect (meaning this test can be used) for the duration of the COVID-19 declaration under Section 56 4(b)(1)  of the Act, 21 U.S.C. section 360bbb-3(b)(1), unless the authorization is terminated or revoked sooner. Performed at [facility], [place] [address] [place], [place] [address]   Culture, body fluid-bottle     Status: None (Preliminary result)   Collection Time: 08/08/19 10:05 AM   Specimen: Pleura  Result Value Ref Range Status   Specimen Description PLEURAL  Final   Special Requests NONE  Final   Culture   Final    NO GROWTH < 24 HOURS Performed at [facility], [place] [address] [place], [place] [address]    Report Status PENDING  Incomplete  Gram stain     Status: None   Collection Time: 08/08/19 10:05 AM   Specimen: Pleura  Result Value  Ref Range Status   Specimen Description PLEURAL  Final   Special Requests NONE  Final   Gram Stain   Final    MODERATE WBC PRESENT, PREDOMINANTLY MONONUCLEAR NO ORGANISMS SEEN Performed at [facility], [address] [place], [place] [address]    Report Status 08/08/2019 FINAL  Final  Culture, bal-quantitative     Status: None (Preliminary result)   Collection Time: 08/09/19 11:59 AM   Specimen: Bronchoalveolar Lavage; Respiratory  Result Value Ref Range Status   Specimen Description   Final    BRONCHIAL ALVEOLAR LAVAGE RLL Performed at [facility] [address], [place], [place] [address]    Special Requests   Final    Normal Performed at [facility], [place] [address], [place], [place] [address]    Gram Stain   Final    FEW WBC PRESENT,BOTH PMN AND MONONUCLEAR NO ORGANISMS SEEN    Culture   Final    NO GROWTH 1 DAY Performed at [facility], [place] [address] [place], [place] [address]    Report Status PENDING  Incomplete         Radiology Studies: Ct Abdomen Pelvis W Contrast  Result Date: 08/09/2019 CLINICAL DATA:  Newly diagnosed right lung carcinoma. Staging. EXAM: CT ABDOMEN AND PELVIS WITH CONTRAST TECHNIQUE: Multidetector CT imaging of the abdomen and  pelvis was performed using the standard protocol following bolus administration of intravenous contrast. CONTRAST:  133m OMNIPAQUE IOHEXOL 300 MG/ML SOLN, 373mOMNIPAQUE IOHEXOL 300 MG/ML SOLN COMPARISON:  Chest only CTA on 08/07/2019; no prior AP CT FINDINGS: Lower Chest: Mediastinal and right hilar lymphadenopathy, bilateral pleural effusions, and right basilar consolidation, as better demonstrated on recent chest CT. Hepatobiliary: No hepatic masses identified. Pancreas:  No mass or inflammatory changes. Spleen: Within normal limits in size and appearance. Adrenals/Urinary Tract: No masses identified. No evidence of hydronephrosis. Stomach/Bowel: No evidence of obstruction, inflammatory process or abnormal fluid collections. Normal appendix visualized. Vascular/Lymphatic: Mild upper lymphadenopathy is seen in the gastrohepatic ligament, with largest lymph node measuring 1.3 cm short axis. A 10 mm retroperitoneal lymph node is seen in the left paraaortic region. No lymphadenopathy identified within the pelvis. No abdominal aortic aneurysm. Aortic atherosclerosis. Reproductive:  No mass or other significant abnormality. Other:  None. Musculoskeletal:  No suspicious bone lesions identified. IMPRESSION: Mild upper abdominal lymphadenopathy in gastrohepatic ligament and left paraaortic region, consistent with metastatic disease. No other sites of metastatic disease identified within the abdomen or pelvis. Electronically Signed   By: JoMarlaine Hind.D.   On: 08/09/2019 18:28        Scheduled Meds: . benzonatate  200 mg Oral TID  . dextromethorphan  30 mg Oral BID  . ipratropium-albuterol  3 mL Nebulization BID  . sodium chloride flush  3 mL Intravenous Q12H   Continuous Infusions: . azithromycin 500 mg (08/10/19 1727)  . cefTRIAXone (ROCEPHIN)  IV 2 g (08/10/19 1255)     LOS: 3 days     AnVernell LeepMD, FACP, FHPacific Endoscopy Center LLCTriad Hospitalists  To contact the attending provider between 7A-7P or the  covering provider during after hours 7P-7A, please log into the web site [email or web address] and access using universal [place] password for that web site. If you do not have the password, please call the hospital operator.  08/10/2019, 5:27 PM

## 2019-08-10 NOTE — Progress Notes (Signed)
This chaplain responded to RN-Raven referral for Pt. spiritual care.  The RN shared the Pt. has just received cancer diagnosis and was tearful.  The chaplain introduced herself to the Pt. The Pt. declined a visit stating she is waiting for more information  to determine her next steps. The chaplain honored her statement and informed the Pt. of how to get in touch with spiritual care.  The chaplain updated the RN.

## 2019-08-11 ENCOUNTER — Other Ambulatory Visit: Payer: Self-pay | Admitting: Oncology

## 2019-08-11 ENCOUNTER — Ambulatory Visit
Admit: 2019-08-11 | Discharge: 2019-08-11 | Disposition: A | Discharge status: Home / Self Care | Payer: Medicare Other | Attending: Radiation Oncology | Admitting: Radiation Oncology

## 2019-08-11 DIAGNOSIS — C3431 Malignant neoplasm of lower lobe, right bronchus or lung: Secondary | ICD-10-CM

## 2019-08-11 DIAGNOSIS — R0602 Shortness of breath: Secondary | ICD-10-CM

## 2019-08-11 DIAGNOSIS — C3491 Malignant neoplasm of unspecified part of right bronchus or lung: Secondary | ICD-10-CM

## 2019-08-11 DIAGNOSIS — R918 Other nonspecific abnormal finding of lung field: Secondary | ICD-10-CM | POA: Diagnosis not present

## 2019-08-11 LAB — CULTURE, BAL-QUANTITATIVE W GRAM STAIN
Culture: NO GROWTH
Special Requests: NORMAL

## 2019-08-11 LAB — CYTOLOGY - NON PAP

## 2019-08-11 LAB — MAGNESIUM: Magnesium: 1.9 mg/dL (ref 1.7–2.4)

## 2019-08-11 MED ORDER — IPRATROPIUM-ALBUTEROL 0.5-2.5 (3) MG/3ML IN SOLN
3.0000 mL | Freq: Four times a day (QID) | RESPIRATORY_TRACT | Status: DC
  Administered 2019-08-12: 3 mL via RESPIRATORY_TRACT
  Filled 2019-08-11 (×2): qty 3

## 2019-08-11 NOTE — Progress Notes (Signed)
The pathology from her biopsy as well as fluid cytology is positive for adenocarcinoma.  These results were reviewed with the reviewing pathologist and discussed today with the patient and her family face-to-face.  The implication of this diagnosis was discussed as well as treatment options.  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  Molecular testing for potentially targetable mutation as well as PD-L1 testing has been requested.  Systemic therapy will be needed and likely will take form of oral targeted therapy, chemotherapy or immunotherapy or combination of the above.  While we are awaiting molecular testing I have recommended proceeding with palliative radiation therapy.  The rationale as well as the logistics of administration of radiation therapy to palliate her current symptoms of shortness of breath as well as cough was discussed.  Specific details of radiation administration will be reviewed by radiation oncology.  Anticipate starting this in the near future and could be continued as an outpatient after the patient's discharge.  After completing radiation therapy, systemic therapy will be the next step pending the results of her molecular testing.  From a disposition standpoint, I have no objections to discharge once her symptoms are improved and she she will be able to receive treatment as an outpatient.  Questions from the patient, her husband as well as the son were addressed today in detail.  We also discussed the prognosis associated with stage IV lung cancer.  Her overall prognosis will be determined whether she be able to withstand any systemic therapy and her response to systemic therapy in the future.  She is desiring aggressive measures at this time we will continue to be aggressive in her treatment at this time.   25  minutes was spent with the patient face-to-face  today.  More than 50% of time was spent today reviewing pathology results, treatment options and complications related to these therapies.

## 2019-08-11 NOTE — Progress Notes (Signed)
NAME:  Jordan Clay, MRN:  [id number], DOB:  Nov 19, 1950, LOS: 4 ADMISSION DATE:  08/07/2019, CONSULTATION DATE:  10/20 REFERRING MD:  Reesa Chew (THR) , CHIEF COMPLAINT:  Cough, SOB  Brief History   68yo female smoker (40 pack years, quit 1 week ago when she got pneumonia) with hx COPD admitted 10/19 for postobstructive PNA.  History of present illness   68 year old female with history of COPD presented 10/19 to ER with cough and shortness of breath x2 weeks.  She was treated by her PCP for pneumonia but did not tolerate the antibiotics and was switched to [facility] 2 days prior to admission.  She continues to have severe cough and dyspnea despite this.  Cough is nonproductive.  Unsure if she has had fever.  Has not slept well 1 weeks related to persistent cough.  No sick contacts that she is aware of.  Past Medical History   has a past medical history of COPD (chronic obstructive pulmonary disease) ([place]), High cholesterol, and Pneumonia.   Significant Hospital Events     Consults:  PCCM 10/20>>>  Procedures:  IR R thoracentesis 10/20 >> adenocarcinoma of the lung Bronchoscopy with endobronchial ultrasound 10/21  Significant Diagnostic Tests:  CTA chest 10/19>>> No evidence of pulmonary embolism.  Obstruction of central right lower lobe bronchus and central low-attenuation, suspicious for centrally obstructing mass.  Near complete right lower lobe consolidation, suspicious for postobstructive pneumonitis. Moderate right pleural effusion.  New bulky mediastinal and right hilar lymphadenopathy, consistent with metastatic disease.  Stable sub-cm bilateral upper lobe and right middle lobe pulmonary Nodules. Cytology 4R 10/21>>  Cytology 11 R 10/21>>   Micro Data:  BC x 2 10/19>>>  COVID 10/19>>> neg   Antimicrobials:  Rocephin 10/19>>> azithro 10/19>>>  Interim history/subjective:  Still a lot of coughing especially when she is speaking. She discussed diagnosis of adenocarcinoma  with Dr. Alen Blew earlier today, planning to meet radiation oncology and initiate 10 days of XRT  Objective   Blood pressure (!) 117/56, pulse (!) 109, temperature 98 F (36.7 C), temperature source Oral, resp. rate 20, height 5\' 7"  (1.702 m), weight 70.3 kg, SpO2 95 %.        Intake/Output Summary (Last 24 hours) at 08/11/2019 1403 Last data filed at 08/11/2019 1100 Gross per 24 hour  Intake 590 ml  Output -  Net 590 ml   Filed Weights   08/07/19 1504  Weight: 70.3 kg    Examination: General: Pleasant female, no acute distress although she does have persistent/near constant cough HENT: Mucous membranes moist, no JVD, no appreciable cervical lymphadenopathy Lungs: Respirations are even, nonlabored on 2 L nasal cannula, persistent dry cough, no obvious dyspnea, diminished right Cardiovascular: S1-S2, RRR Abdomen: Soft, nontender Extremities: Warm and dry, no edema Neuro: Awake, alert, moves all extremities  Resolved Hospital Problem list     Assessment & Plan:  Right lower lobe and hilar mass with associated lymphadenopathy.  Right pleural effusion now found to be positive for adenocarcinoma.  Cytology on her 4R, 64 R both pending but presume these will be malignant. -Appreciate oncology and radiation oncology evaluation.  Planning for initiation of palliative treatment starting with targeted XRT to address partial right lower lobe bronchial obstruction, chronic cough -Arrange for follow-up with pulmonary as an outpatient to address possible COPD, residual cough.  She will see T. Parrett, NP 7/16 at 10 AM    Labs   CBC: Recent Labs  Lab 08/07/19 1556 08/08/19 0523 08/09/19 0556 08/10/19 4098  WBC 19.3* 15.7* 15.7* 16.8*  NEUTROABS 15.1* 11.9*  --   --   HGB 14.6 12.9 13.0 13.2  HCT 45.4 40.1 40.5 41.8  MCV 93.2 94.4 93.1 94.8  PLT 301 274 274 559    Basic Metabolic Panel: Recent Labs  Lab 08/07/19 1556 08/08/19 0523 08/09/19 0556 08/10/19 0557 08/11/19 0536   NA 136 135 132* 131*  --   K 3.6 3.8 3.9 4.1  --   CL 103 104 101 100  --   CO2 21* 22 22 22   --   GLUCOSE 128* 111* 122* 108*  --   BUN 15 11 10 9   --   CREATININE 0.97 0.68 0.71 0.69  --   CALCIUM 8.4* 7.7* 7.6* 7.6*  --   MG  --  2.2 2.1 1.9 1.9   GFR: Estimated Creatinine Clearance: 66.4 mL/min (by C-G formula based on SCr of 0.69 mg/dL). Recent Labs  Lab 08/07/19 1556 08/07/19 1557 08/08/19 0523 08/09/19 0556 08/10/19 0557  PROCALCITON  --   --   --  <0.10  --   WBC 19.3*  --  15.7* 15.7* 16.8*  LATICACIDVEN  --  1.8  --   --   --     Liver Function Tests: Recent Labs  Lab 08/07/19 1556 08/09/19 0556 08/10/19 0557  AST 23 21 17   ALT 29 32 25  ALKPHOS 63 48 48  BILITOT 0.8 0.7 0.7  PROT 5.9* 5.1* 5.0*  ALBUMIN 2.8* 2.3* 2.1*   No results for input(s): LIPASE, AMYLASE in the last 168 hours. No results for input(s): AMMONIA in the last 168 hours.  ABG No results found for: PHART, PCO2ART, PO2ART, HCO3, TCO2, ACIDBASEDEF, O2SAT   Coagulation Profile: Recent Labs  Lab 08/08/19 1200  INR 1.0    Cardiac Enzymes: No results for input(s): CKTOTAL, CKMB, CKMBINDEX, TROPONINI in the last 168 hours.  HbA1C: No results found for: HGBA1C  CBG: Recent Labs  Lab 08/09/19 [place]     Baltazar Apo, MD, PhD 08/11/2019, 2:03 PM [facility] [phone] or if no answer [phone]

## 2019-08-11 NOTE — Progress Notes (Addendum)
PCCM interval note  Cytology from the patient's pleural fluid, endobronchial brushings, needle aspirations of 4R and 11 R nodal tissue are all still pending.  Based on my preliminary discussions with pathology, suspect that both the pleural fluid and the nodal biopsies will be diagnostic.  This would mean stage IV disease if NSCLCA.  Await these results.  Appreciate Dr. Hazeline Junker consultation. Suspect that palliative therapy will be initiated while she is admitted, especially if this is SCLCA. If she is to be discharged then she will need quick follow up with Oncology as outpatient.   Baltazar Apo, MD, PhD 08/11/2019, 10:52 AM [facility] [phone] or if no answer [phone]   Addendum: Cytology results from the patient's pleural fluid 10/20 consistent with adenocarcinoma, lung source.  Again appreciate Dr. Hazeline Junker management.  I predict that the 4R, 11R nodes will be positive as well, but this already reflects stage IV disease.  Please call us if we can help you in any way.  Baltazar Apo, MD, PhD 08/11/2019, 12:17 PM [facility] [phone] or if no answer [phone]

## 2019-08-11 NOTE — Care Management Important Message (Signed)
Important Message  Patient Details IM Letter given to Sharren Bridge SW to present to the Patient Name: Jordan Clay MRN: [id number] Date of Birth: [birth year]   Medicare Important Message Given:  Yes     Kerin Salen 08/11/2019, 10:11 AM

## 2019-08-11 NOTE — Progress Notes (Signed)
PROGRESS NOTE   Jordan WITHEM  ACZ:[id number]    DOB: 02-06-1951    DOA: 08/07/2019  PCP: Hulan Fess, MD   I have briefly reviewed patients previous medical records in [facility].  Chief Complaint  Patient presents with  . COPD  . low sats    Brief Narrative:  68 year old female with PMH of, tobacco abuse, HLD, presented to the ED on 10/19 due to cough and progressive dyspnea of 2 weeks duration.  She was recently treated by her PCP for pneumonia but symptoms persisted despite this.  CT chest was negative for pulmonary embolism but showed mass in the right lower lobe with obstruction and bulky chest lymphadenopathy and right pleural effusion.  IR and pulmonology consulted.  S/p right thoracentesis 700 mL.  S/p bronchoscopy 10/21 that showed right lower lobe mass with hilar mediastinal adenopathy, biopsies obtained and pathology pending.  Medical oncology consulted for suspected primary lung cancer.   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  Probable pneumonia.  CT chest recommended.  CTA chest 10/19: No PE.  Obstruction of central right lower lobe bronchus suspicious for centrally obstructing mass.  Near complete RLL consolidation, suspicious for postobstructive pneumonitis.  Moderate right pleural effusion.  New bulky mediastinal and right hilar lymphadenopathy consistent with metastatic disease.  S/p ultrasound-guided right thoracentesis 700 mL by IR on 10/20.    S/p bronchoscopy with detail findings as below, biopsy and brushings-follow-up results.  Continue IV ceftriaxone and azithromycin for  postobstructive pneumonia and complete 1 week course.  Tolerating ceftriaxone.  CT abdomen shows lymphadenopathy concerning for stage IV cancer.  Oncology and pulmonary input appreciated.  Pulmonology have signed off and arranged outpatient follow-up.  As per oncology follow-up, pathology from her biopsy as well as fluid cytology positive for adenocarcinoma/stage IV non-small cell lung cancer.  Planning palliative radiation while hospitalized (likely to start Monday next week) to address partially obstructive lung mass with associated cough and dyspnea followed by systemic therapy after results of her molecular testing.  Cough is somewhat suppressed with Tussionex, continue.  Acute respiratory failure with hypoxia  Secondary to suspected lung cancer, postobstructive pneumonia and pleural effusion.  Treat underlying cause.  Oxygen supplementation.   COPD  No clinical bronchospasm.  Continue as needed bronchodilators.  Tobacco abuse  Recently quit.  Cessation counseled.   DVT prophylaxis: SCDs Code Status: Full Family Communication: None at bedside today. Disposition: To be determined pending hospital course.  Will remain in the hospital until next week to initiate radiation treatment.   Consultants:  IR PCCM Medical oncology  Procedures:  Ultrasound-guided right thoracentesis by IR 10/20 Bronchoscopy by pulmonology on 10/21 with findings as below  Impression - Right lower lobe mass - Right hilar mass - Mediastinal mass - The airway examination of the left lung was normal. - A narrowing was found in the right middle lobe and in the right lower lobe. The lesion has a malignant appearance. - Endobronchial ultrasound was performed > enlarged nodes at 4R, 7, 10R, 11R. - A transbronchial needle aspiration was performed at 4R and 7. - Brushings were obtained from RLL bronchus. - Bronchoalveolar lavage was performed  in RLL.  Antimicrobials:  IV ceftriaxone and  azithromycin   Subjective: Feels that her cough is somewhat better.  Was able to sleep for 1 to 2 hours intermittently at night.  Minimal exertion including speaking bring some intractable pain from cough.  Objective:  Vitals:   08/11/19 0458 08/11/19 0626 08/11/19 0729 08/11/19 1258  BP:  101/64  (!) 117/56  Pulse: (!) 103 (!) 102  (!) 109  Resp: 20   20  Temp:    98 F (36.7 C)  TempSrc:    Oral  SpO2:   94% 95%  Weight:      Height:        Examination:  General exam: Middle-age female, moderately built and nourished lying propped up in bed, seems more comfortable and did not cough as much compared to the last couple days. Respiratory system: Reduced breath sounds right > left.  No wheezing or rhonchi appreciated.  Harsh breath sounds bilaterally.  No increased work of breathing.  No significant change. Cardiovascular system: S1 & S2 heard, RRR. No JVD, murmurs, rubs, gallops or clicks. No pedal edema.  Telemetry personally reviewed: Sinus tachycardia in the 100s.   Gastrointestinal system: Abdomen is nondistended, soft and nontender. No organomegaly or masses felt. Normal bowel sounds heard. Central nervous system: Alert and oriented x2.  Intermittently with mild confusion. No focal neurological deficits. Extremities: Symmetric 5 x 5 power. Skin: No rashes, lesions or ulcers Psychiatry: Judgement and insight appear normal. Mood & affect appropriate.     Data Reviewed: I have personally reviewed following labs and imaging studies  CBC: Recent Labs  Lab 08/07/19 1556 08/08/19 0523 08/09/19 0556 08/10/19 0557  WBC 19.3* 15.7* 15.7* 16.8*  NEUTROABS 15.1* 11.9*  --   --   HGB 14.6 12.9 13.0 13.2  HCT 45.4 40.1 40.5 41.8  MCV 93.2 94.4 93.1 94.8  PLT 301 274 274 454   Basic Metabolic Panel: Recent Labs  Lab 08/07/19 1556 08/08/19 0523 08/09/19 0556 08/10/19 0557 08/11/19 0536  NA 136 135 132* 131*  --   K 3.6 3.8 3.9 4.1  --   CL 103 104 101 100  --   CO2  21* _0 --   GLUCOSE 128* 111* 122* 108*  --   BUN _1 --   CREATININE 0.97 0.68 0.71 0.69  --   CALCIUM 8.4* 7.7* 7.6* 7.6*  --   MG  --  2.2 2.1 1.9 1.9   Liver Function Tests: Recent Labs  Lab 08/07/19 1556 08/09/19 0556 08/10/19 0557  AST _2 ALT 29 32 25  ALKPHOS 63 48 48  BILITOT 0.8 0.7 0.7  PROT 5.9* 5.1* 5.0*  ALBUMIN 2.8* 2.3* 2.1*    Cardiac Enzymes: No results for input(s): CKTOTAL, CKMB, CKMBINDEX, TROPONINI in the last 168 hours.  CBG: Recent Labs  Lab 08/09/19 0749  GLUCAP 115*    Recent Results (from the past 240 hour(s))  Urine culture     Status: Abnormal   Collection Time: 08/07/19  3:57 PM   Specimen: Urine, Random  Result Value Ref Range Status   Specimen Description   Final    URINE, RANDOM Performed at [facility] [address], [place], [place] [address]    Special Requests   Final    NONE Performed at [facility], [place] [address], [place], [place] [address]    Culture (A)  Final    <  10,000 COLONIES/mL INSIGNIFICANT GROWTH Performed at [facility], [place] [address], [place], [place] [address]    Report Status 08/08/2019 FINAL  Final  Culture, blood (routine x 2)     Status: None (Preliminary result)   Collection Time: 08/07/19  3:57 PM   Specimen: BLOOD  Result Value Ref Range Status   Specimen Description   Final    BLOOD LEFT ANTECUBITAL Performed at [facility] [address] [place], [place] [address]    Special Requests   Final    BOTTLES DRAWN AEROBIC AND ANAEROBIC Blood Culture results may not be optimal due to an inadequate volume of blood received in culture bottles Performed at [facility] [address] [place], [place] [address]    Culture   Final    NO GROWTH 4 DAYS Performed at [facility], [place] [address], [place], [place] [address]    Report Status PENDING  Incomplete  Culture, blood (routine x 2)      Status: None (Preliminary result)   Collection Time: 08/07/19  4:39 PM   Specimen: BLOOD  Result Value Ref Range Status   Specimen Description   Final    BLOOD LEFT ANTECUBITAL Performed at [facility] [address] [place], [place] [address]    Special Requests   Final    BOTTLES DRAWN AEROBIC AND ANAEROBIC Blood Culture adequate volume Performed at [facility] [address] [place], [place] [address]    Culture   Final    NO GROWTH 4 DAYS Performed at [facility], [place] [address] [place], [place] [address]    Report Status PENDING  Incomplete  SARS CORONAVIRUS 2 (TAT 6-24 HRS) Nasopharyngeal Nasopharyngeal Swab     Status: None   Collection Time: 08/07/19  4:39 PM   Specimen: Nasopharyngeal Swab  Result Value Ref Range Status   SARS Coronavirus 2 NEGATIVE NEGATIVE Final    Comment: (NOTE) SARS-CoV-2 target nucleic acids are NOT DETECTED. The SARS-CoV-2 RNA is generally detectable in upper and lower respiratory specimens during the acute phase of infection. Negative results do not preclude SARS-CoV-2 infection, do not rule out co-infections with other pathogens, and should not be used as the sole basis for treatment or other patient management decisions. Negative results must be combined with clinical observations, patient history, and epidemiological information. The expected result is Negative. Fact Sheet for Patients: SugarRoll.be Fact Sheet for Healthcare Providers: https://www.woods-mathews.com/ This test is not yet approved or cleared by the [place] FDA and  has been authorized for detection and/or diagnosis of SARS-CoV-2 by FDA under an Emergency Use Authorization (EUA). This EUA will remain  in effect (meaning this test can be used) for the duration of the COVID-19 declaration under Section 56 4(b)(1) of the Act, 21 U.S.C. section 360bbb-3(b)(1), unless the  authorization is terminated or revoked sooner. Performed at [facility], [place] [address] [place], [place] [address]   Culture, body fluid-bottle     Status: None (Preliminary result)   Collection Time: 08/08/19 10:05 AM   Specimen: Pleura  Result Value Ref Range Status   Specimen Description PLEURAL  Final   Special Requests NONE  Final   Culture   Final    NO GROWTH 3 DAYS Performed at [facility] [address], [place],  [address]    Report Status PENDING  Incomplete  Gram stain     Status: None  Collection Time: 08/08/19 10:05 AM   Specimen: Pleura  Result Value Ref Range Status   Specimen Description PLEURAL  Final   Special Requests NONE  Final   Gram Stain   Final    MODERATE WBC PRESENT, PREDOMINANTLY MONONUCLEAR NO ORGANISMS SEEN Performed at [facility], [address], [place], [place] [address]    Report Status 08/08/2019 FINAL  Final  Culture, bal-quantitative     Status: None   Collection Time: 08/09/19 11:59 AM   Specimen: Bronchoalveolar Lavage; Respiratory  Result Value Ref Range Status   Specimen Description   Final    BRONCHIAL ALVEOLAR LAVAGE RLL Performed at [facility] [address], [place], [place] [address]    Special Requests   Final    Normal Performed at [facility], [place] [address], [place], [place] [address]    Gram Stain   Final    FEW WBC PRESENT,BOTH PMN AND MONONUCLEAR NO ORGANISMS SEEN    Culture   Final    NO GROWTH 2 DAYS Performed at [facility], [place] [address] [place], [place] [address]    Report Status 08/11/2019 FINAL  Final         Radiology Studies: Ct Abdomen Pelvis W Contrast  Result Date: 08/09/2019 CLINICAL DATA:  Newly diagnosed right lung carcinoma. Staging. EXAM: CT ABDOMEN AND PELVIS WITH CONTRAST TECHNIQUE: Multidetector CT imaging of the abdomen and pelvis was performed using the standard protocol following bolus administration  of intravenous contrast. CONTRAST:  19m OMNIPAQUE IOHEXOL 300 MG/ML SOLN, 337mOMNIPAQUE IOHEXOL 300 MG/ML SOLN COMPARISON:  Chest only CTA on 08/07/2019; no prior AP CT FINDINGS: Lower Chest: Mediastinal and right hilar lymphadenopathy, bilateral pleural effusions, and right basilar consolidation, as better demonstrated on recent chest CT. Hepatobiliary: No hepatic masses identified. Pancreas:  No mass or inflammatory changes. Spleen: Within normal limits in size and appearance. Adrenals/Urinary Tract: No masses identified. No evidence of hydronephrosis. Stomach/Bowel: No evidence of obstruction, inflammatory process or abnormal fluid collections. Normal appendix visualized. Vascular/Lymphatic: Mild upper lymphadenopathy is seen in the gastrohepatic ligament, with largest lymph node measuring 1.3 cm short axis. A 10 mm retroperitoneal lymph node is seen in the left paraaortic region. No lymphadenopathy identified within the pelvis. No abdominal aortic aneurysm. Aortic atherosclerosis. Reproductive:  No mass or other significant abnormality. Other:  None. Musculoskeletal:  No suspicious bone lesions identified. IMPRESSION: Mild upper abdominal lymphadenopathy in gastrohepatic ligament and left paraaortic region, consistent with metastatic disease. No other sites of metastatic disease identified within the abdomen or pelvis. Electronically Signed   By: JoMarlaine Hind.D.   On: 08/09/2019 18:28        Scheduled Meds: . benzonatate  200 mg Oral TID  . dextromethorphan  30 mg Oral BID  . ipratropium-albuterol  3 mL Nebulization BID  . sodium chloride flush  3 mL Intravenous Q12H   Continuous Infusions: . azithromycin 500 mg (08/11/19 1558)  . cefTRIAXone (ROCEPHIN)  IV 2 g (08/11/19 1226)     LOS: 4 days     AnVernell LeepMD, FACP, [facility]HEye [facility] LL[facility]riad [facility]  To contact the attending provider between 7A-7P or the covering provider during after hours 7P-7A, please log into the web site  [email or web address] and access using universal [place] password for that web site. If you do not have the password, please call the hospital operator.  08/11/2019, 5:08 PM

## 2019-08-11 NOTE — Progress Notes (Signed)
Pt. Son has visited twice today. He was told yesterday that he would be allowed an exception yesterday only. Lobby made aware to not allow visitor per hospital policy. Charge RN notified and spoke to patient about adhering to policy.  Staffing understands the social dynamics of recent diagnosis and empathize with family. AC was also made aware. Even after being advised about policy family has made reference that son will visit again this weekend regardless of policy.  He is a nice man, but patient is very sick and needs rest. During his time here he has requested mother be transferred to another hospital for a PET scan. MD made aware and spoke about concerns with patient. However this disturbance has patient anxious and incited a coughing spell and no available PRN's to give her.  Will continue to monitor the situation.

## 2019-08-12 ENCOUNTER — Inpatient Hospital Stay (HOSPITAL_COMMUNITY): Payer: Medicare Other

## 2019-08-12 DIAGNOSIS — J42 Unspecified chronic bronchitis: Secondary | ICD-10-CM | POA: Diagnosis not present

## 2019-08-12 DIAGNOSIS — R0602 Shortness of breath: Secondary | ICD-10-CM

## 2019-08-12 DIAGNOSIS — J9601 Acute respiratory failure with hypoxia: Secondary | ICD-10-CM | POA: Diagnosis not present

## 2019-08-12 DIAGNOSIS — R918 Other nonspecific abnormal finding of lung field: Secondary | ICD-10-CM | POA: Diagnosis not present

## 2019-08-12 DIAGNOSIS — J189 Pneumonia, unspecified organism: Secondary | ICD-10-CM | POA: Diagnosis not present

## 2019-08-12 DIAGNOSIS — C3491 Malignant neoplasm of unspecified part of right bronchus or lung: Secondary | ICD-10-CM

## 2019-08-12 LAB — CULTURE, BLOOD (ROUTINE X 2)
Culture: NO GROWTH
Culture: NO GROWTH
Special Requests: ADEQUATE

## 2019-08-12 LAB — MAGNESIUM: Magnesium: 2 mg/dL (ref 1.7–2.4)

## 2019-08-12 MED ORDER — PANTOPRAZOLE SODIUM 40 MG PO TBEC
40.0000 mg | DELAYED_RELEASE_TABLET | Freq: Two times a day (BID) | ORAL | Status: DC
  Administered 2019-08-12 – 2019-08-28 (×31): 40 mg via ORAL
  Filled 2019-08-12 (×33): qty 1

## 2019-08-12 MED ORDER — MORPHINE SULFATE (PF) 4 MG/ML IV SOLN
4.0000 mg | INTRAVENOUS | Status: DC | PRN
  Administered 2019-08-13 (×4): 4 mg via INTRAVENOUS
  Filled 2019-08-12 (×4): qty 1

## 2019-08-12 MED ORDER — OXYCODONE HCL 5 MG PO TABS
10.0000 mg | ORAL_TABLET | ORAL | Status: DC
  Administered 2019-08-12 – 2019-08-13 (×3): 10 mg via ORAL
  Filled 2019-08-12 (×3): qty 2

## 2019-08-12 MED ORDER — HYDROCOD POLST-CPM POLST ER 10-8 MG/5ML PO SUER: 5.0000 mL | Freq: Three times a day (TID) | ORAL | Status: DC | PRN

## 2019-08-12 MED ORDER — HYDROCOD POLST-CPM POLST ER 10-8 MG/5ML PO SUER
5.0000 mL | Freq: Two times a day (BID) | ORAL | Status: DC | PRN
  Filled 2019-08-12: qty 5

## 2019-08-12 MED ORDER — POLYETHYLENE GLYCOL 3350 17 G PO PACK
17.0000 g | PACK | Freq: Two times a day (BID) | ORAL | Status: DC
  Administered 2019-08-12 – 2019-08-17 (×10): 17 g via ORAL
  Filled 2019-08-12 (×18): qty 1

## 2019-08-12 MED ORDER — METHYLPREDNISOLONE SODIUM SUCC 125 MG IJ SOLR
80.0000 mg | Freq: Four times a day (QID) | INTRAMUSCULAR | Status: DC
  Administered 2019-08-12 – 2019-08-13 (×2): 80 mg via INTRAVENOUS
  Filled 2019-08-12 (×2): qty 2

## 2019-08-12 MED ORDER — CHLORHEXIDINE GLUCONATE CLOTH 2 % EX PADS
6.0000 | MEDICATED_PAD | Freq: Every day | CUTANEOUS | Status: DC
  Administered 2019-08-12 – 2019-08-29 (×17): 6 via TOPICAL

## 2019-08-12 NOTE — Progress Notes (Signed)
PROGRESS NOTE   Jordan Clay  OJJ:[id number]    DOB: 13-Feb-1951    DOA: 08/07/2019  PCP: Hulan Fess, MD   I have briefly reviewed patients previous medical records in [facility].  Chief Complaint  Patient presents with   COPD   low sats    Brief Narrative:  68 year old female with PMH of, tobacco abuse, HLD, presented to the ED on 10/19 due to cough and progressive dyspnea of 2 weeks duration.  She was recently treated by her PCP for pneumonia but symptoms persisted despite this.  CT chest was negative for pulmonary embolism but showed mass in the right lower lobe with obstruction and bulky chest lymphadenopathy and right pleural effusion.  IR and pulmonology consulted.  S/p right thoracentesis 700 mL.  S/p bronchoscopy 10/21 that showed right lower lobe mass with hilar mediastinal adenopathy.  Preliminary cytopathology results consistent with adenocarcinoma.  Medical oncology consulting and coordinating care.  Radiation oncology consulted for palliative radiation.   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  Probable pneumonia.  CT chest recommended.  CTA chest 10/19: No PE.  Obstruction of central right lower lobe bronchus suspicious for centrally obstructing mass.  Near complete RLL consolidation, suspicious for postobstructive pneumonitis.  Moderate right pleural effusion.  New bulky mediastinal and right hilar lymphadenopathy consistent with metastatic disease.  S/p ultrasound-guided right thoracentesis 700 mL by IR on 10/20.    S/p bronchoscopy with detail findings as below, biopsy and  brushings.  Has been on IV ceftriaxone and azithromycin for postobstructive pneumonia since 10/19, discontinue after 10/25 dose (total 7 days).  CT abdomen shows lymphadenopathy concerning for stage IV cancer.  Oncology and pulmonary input appreciated.  Pulmonology have signed off and arranged outpatient follow-up.  As per oncology follow-up, pathology from her biopsy as well as fluid cytology positive for adenocarcinoma/stage IV non-small cell lung cancer.  Planning palliative radiation while hospitalized (likely to start Monday next week) to address partially obstructive lung mass with associated cough and dyspnea followed by systemic therapy after results of her molecular testing.  Cough is better with cough suppressants.  As per report, patient reportedly had radiation markings done yesterday.  Acute respiratory failure with hypoxia  Secondary to suspected lung cancer, postobstructive pneumonia and pleural effusion.  Treat underlying cause.  Oxygen supplementation.  Target for oxygen saturations >92%.  COPD  No clinical bronchospasm.  Continue as needed bronchodilators.  Tobacco abuse  Recently quit.  Cessation counseled.  Prolonged QT interval  EKG on admission showed QTC of 543 ms.  EKG 10/22: QTC 435 ms.  Resolved.  Follow EKG periodically.  Sinus tachycardia  Mild.  Likely multifactorial due to ongoing cough, hypoxia.  Asymptomatic.   DVT prophylaxis: SCDs Code Status: Full Family Communication: I discussed in detail with patient's son via phone on 10/23.  He had asked regarding transfer to [facility] for PET scan.  Advised him that a PET scan is not indicated right at this time and will not change management.  Discussed with him at length.  He was satisfied with ongoing care here.  None at bedside today. Disposition: To be determined pending hospital course.  Will remain in the hospital until next week to initiate radiation treatment.   Consultants:  IR PCCM Medical  oncology  Procedures:  Ultrasound-guided right thoracentesis by IR 10/20 Bronchoscopy by pulmonology on 10/21 with findings as below  Impression - Right lower lobe mass - Right hilar mass - Mediastinal mass - The airway examination of the left lung was normal. - A narrowing was found in the right middle lobe and in the right lower lobe. The lesion has a malignant appearance. - Endobronchial ultrasound was performed > enlarged nodes at 4R, 7, 10R, 11R. - A transbronchial needle aspiration was performed at 4R and 7. - Brushings were obtained from RLL bronchus. - Bronchoalveolar lavage was performed in RLL.  Antimicrobials:  IV ceftriaxone and azithromycin  Cytopathology  Right pleural fluid: Malignant cells consistent with metastatic adenocarcinoma.  FINAL MICROSCOPIC DIAGNOSIS:  A. LYMPH NODE, STATION 7, FINE NEEDLE ASPIRATION:  - Malignant cells consistent with non-small cell carcinoma.   B. LYMPH NODE, 11R, FINE NEEDLE ASPIRATION:  - Malignant cells consistent with non-small cell carcinoma.   C. LUNG, RIGHT LOWER LOBE, BRUSHINGS:  - Malignant cells consistent with non-small cell carcinoma.    Subjective: Patient interviewed and examined with RN.  "I slept well last night".  Reportedly slept for several hours last night.  Cough better than 2 days ago but still has episodes of hacking cough brought on by minimal activity such as speaking.  No chest pain reported.  Intermittent dyspnea.  Objective:  Vitals:   08/12/19 0743 08/12/19 0757 08/12/19 0812 08/12/19 1108  BP:      Pulse:  (!) 111 (!) 111 (!) 113  Resp:   20   Temp:      TempSrc:      SpO2: 92%   91%  Weight:      Height:      BP: 131/78, RR 20/min.  Examination:  General exam: Middle-age female, moderately built and nourished, ill looking, lying/sleeping propped up in bed without distress.  On waking her up, has intermittent coughing. Respiratory system: Reduced breath sounds/bronchial breath sounds  right base and mid zone.  Rest of lung fields with harsh breath sounds.  No crackles, wheezing or rhonchi.  No increased work of breathing except during coughing spells. Cardiovascular system: S1 and S2 heard, regular tachycardia.  No JVD, murmurs or pedal edema.  Telemetry personally reviewed: Sinus tachycardia in the 100s-120s. Gastrointestinal system: Abdomen is nondistended, soft and nontender. No organomegaly or masses felt. Normal bowel sounds heard. Central nervous system: Sleeping but easily arousable, oriented x3.  At times appears slightly confused.  No focal neurological deficits. Extremities: Symmetric 5 x 5 power.?  Tip of fingers cyanosis. Skin: No rashes, lesions or ulcers Psychiatry: Judgement and insight appear normal. Mood & affect appropriate.     Data Reviewed: I have personally reviewed following labs and imaging studies  CBC: Recent Labs  Lab 08/07/19 1556 08/08/19 0523 08/09/19 0556 08/10/19 0557  WBC 19.3* 15.7* 15.7* 16.8*  NEUTROABS 15.1* 11.9*  --   --   HGB 14.6 12.9 13.0 13.2  HCT 45.4 40.1 40.5 41.8  MCV 93.2 94.4 93.1 94.8  PLT 301 274 274 161   Basic Metabolic Panel: Recent Labs  Lab 08/07/19 1556 08/08/19 0523 08/09/19 0556 08/10/19 0557 08/11/19 0536 08/12/19 0605  NA 136 135 132* 131*  --   --   K 3.6 3.8 3.9 4.1  --   --   CL 103 104 101 100  --   --  CO2 21* _0 --   --   GLUCOSE 128* 111* 122* 108*  --   --   BUN _1 --   --   CREATININE 0.97 0.68 0.71 0.69  --   --   CALCIUM 8.4* 7.7* 7.6* 7.6*  --   --   MG  --  2.2 2.1 1.9 1.9 2.0   Liver Function Tests: Recent Labs  Lab 08/07/19 1556 08/09/19 0556 08/10/19 0557  AST _2 ALT 29 32 25  ALKPHOS 63 48 48  BILITOT 0.8 0.7 0.7  PROT 5.9* 5.1* 5.0*  ALBUMIN 2.8* 2.3* 2.1*    Cardiac Enzymes: No results for input(s): CKTOTAL, CKMB, CKMBINDEX, TROPONINI in the last 168 hours.  CBG: Recent Labs  Lab 08/09/19 0749  GLUCAP 115*    Recent Results  (from the past 240 hour(s))  Urine culture     Status: Abnormal   Collection Time: 08/07/19  3:57 PM   Specimen: Urine, Random  Result Value Ref Range Status   Specimen Description   Final    URINE, RANDOM Performed at [facility] [address], [place], [place] [address]    Special Requests   Final    NONE Performed at [facility], [place] [address], [place], [place] [address]    Culture (A)  Final    <10,000 COLONIES/mL INSIGNIFICANT GROWTH Performed at [facility] [address] [place], [place] [address]    Report Status 08/08/2019 FINAL  Final  Culture, blood (routine x 2)     Status: None   Collection Time: 08/07/19  3:57 PM   Specimen: BLOOD  Result Value Ref Range Status   Specimen Description   Final    BLOOD LEFT ANTECUBITAL Performed at [facility] [address], [place], [place] [address]    Special Requests   Final    BOTTLES DRAWN AEROBIC AND ANAEROBIC Blood Culture results may not be optimal due to an inadequate volume of blood received in culture bottles Performed at [facility] [address] [place], [place] [address]    Culture   Final    NO GROWTH 5 DAYS Performed at [facility], [place] [address], [place], [place] [address]    Report Status 08/12/2019 FINAL  Final  Culture, blood (routine x 2)     Status: None   Collection Time: 08/07/19  4:39 PM   Specimen: BLOOD  Result Value Ref Range Status   Specimen Description   Final    BLOOD LEFT ANTECUBITAL Performed at [facility] [address], [place], [place] [address]    Special Requests   Final    BOTTLES DRAWN AEROBIC AND ANAEROBIC Blood Culture adequate volume Performed at [facility] [address] [place], [place] [address]    Culture   Final    NO GROWTH 5 DAYS Performed at [facility], [place] [address] [place],  [address]    Report Status  08/12/2019 FINAL  Final  SARS CORONAVIRUS 2 (TAT 6-24 HRS) Nasopharyngeal Nasopharyngeal Swab     Status: None   Collection Time: 08/07/19  4:39 PM   Specimen: Nasopharyngeal Swab  Result Value Ref Range Status   SARS Coronavirus 2 NEGATIVE NEGATIVE Final    Comment: (NOTE) SARS-CoV-2 target nucleic acids are NOT DETECTED. The SARS-CoV-2 RNA is generally detectable in upper and lower respiratory specimens  during the acute phase of infection. Negative results do not preclude SARS-CoV-2 infection, do not rule out co-infections with other pathogens, and should not be used as the sole basis for treatment or other patient management decisions. Negative results must be combined with clinical observations, patient history, and epidemiological information. The expected result is Negative. Fact Sheet for Patients: SugarRoll.be Fact Sheet for Healthcare Providers: https://www.woods-mathews.com/ This test is not yet approved or cleared by the [place] FDA and  has been authorized for detection and/or diagnosis of SARS-CoV-2 by FDA under an Emergency Use Authorization (EUA). This EUA will remain  in effect (meaning this test can be used) for the duration of the COVID-19 declaration under Section 56 4(b)(1) of the Act, 21 U.S.C. section 360bbb-3(b)(1), unless the authorization is terminated or revoked sooner. Performed at [facility], [place] [address], [place], [place] [address]   Culture, body fluid-bottle     Status: None (Preliminary result)   Collection Time: 08/08/19 10:05 AM   Specimen: Pleura  Result Value Ref Range Status   Specimen Description PLEURAL  Final   Special Requests NONE  Final   Culture   Final    NO GROWTH 4 DAYS Performed at [facility], [address], [place], [place] [address]    Report Status PENDING  Incomplete  Gram stain     Status: None   Collection Time: 08/08/19 10:05 AM   Specimen: Pleura    Result Value Ref Range Status   Specimen Description PLEURAL  Final   Special Requests NONE  Final   Gram Stain   Final    MODERATE WBC PRESENT, PREDOMINANTLY MONONUCLEAR NO ORGANISMS SEEN Performed at [facility], [place] [address], [place], [place] [address]    Report Status 08/08/2019 FINAL  Final  Culture, bal-quantitative     Status: None   Collection Time: 08/09/19 11:59 AM   Specimen: Bronchoalveolar Lavage; Respiratory  Result Value Ref Range Status   Specimen Description   Final    BRONCHIAL ALVEOLAR LAVAGE RLL Performed at [facility] [address] [place], [place] [address]    Special Requests   Final    Normal Performed at [facility], [place] [address], [place], [place] [address]    Gram Stain   Final    FEW WBC PRESENT,BOTH PMN AND MONONUCLEAR NO ORGANISMS SEEN    Culture   Final    NO GROWTH 2 DAYS Performed at [facility], [place] [address] [place], [place] [address]    Report Status 08/11/2019 FINAL  Final         Radiology Studies: No results found.      Scheduled Meds:  benzonatate  200 mg Oral TID   dextromethorphan  30 mg Oral BID   ipratropium-albuterol  3 mL Nebulization QID   sodium chloride flush  3 mL Intravenous Q12H   Continuous Infusions:  azithromycin 500 mg (08/11/19 1558)   cefTRIAXone (ROCEPHIN)  IV 2 g (08/11/19 1226)     LOS: 5 days     Vernell Leep, MD, FACP, [facility]. [facility]  To contact the attending provider between 7A-7P or the covering provider during after hours 7P-7A, please log into the web site [email or web address] and access using universal [place] password for that web site. If you do not have the password, please call the hospital operator.  08/12/2019, 12:47 PM

## 2019-08-12 NOTE — Progress Notes (Signed)
Patient is coughing. Respirations - 19 per monitor. Tussionex last administered at 2232 per [facility]. Contacted Dr. Algis Liming to get verbal order to give PRN dose of Tussionex now. Verbal order given to administer medication now.

## 2019-08-12 NOTE — Progress Notes (Signed)
NAME:  Jordan Clay, MRN:  [id number], DOB:  May 02, 1951, LOS: 5 ADMISSION DATE:  08/07/2019, CONSULTATION DATE:  10/20 REFERRING MD:  Reesa Chew (THR) , CHIEF COMPLAINT:  Cough, SOB  Brief History   68yo female smoker (40 pack years, quit 1 week PTA when she got pneumonia) with hx COPD admitted 10/19 for postobstructive PNA.  History of present illness   68 year old female with history of COPD presented 10/19 to ER with cough and shortness of breath x 2 weeks.  She was treated by her PCP for pneumonia but did not tolerate the antibiotics and was switched to [facility] 2 days prior to admission.  She continues to have severe cough and dyspnea despite this.  Cough is nonproductive.  Unsure if she has had fever.  Has not slept well 1 weeks related to persistent cough.  No sick contacts that she is aware of.  Past Medical History   has a past medical history of COPD (chronic obstructive pulmonary disease) ([place]), High cholesterol, and Pneumonia.   Significant Hospital Events   Transferred to ICU pm 10/24 for cough > sob   Consults:  PCCM 10/20>>>   Procedures:  IR R thoracentesis 10/20 > x 700 cc  No improvement is sob or cough/ cyt pos adenocarcinoma of the lung Bronchoscopy 10/21: 90% obst distal BI/ with endobronchial ultrasound 10/21 >>>  Significant Diagnostic Tests:  CTA chest 10/19>>> No evidence of pulmonary embolism.  Obstruction of central right lower lobe bronchus and central low-attenuation, suspicious for centrally obstructing mass.  Near complete right lower lobe consolidation, suspicious for postobstructive pneumonitis. Moderate right pleural effusion.  New bulky mediastinal and right hilar lymphadenopathy, consistent with metastatic disease.  Stable sub-cm bilateral upper lobe and right middle lobe pulmonary Nodules. Cytology 4R 10/21>>  Cytology 11 R 10/21>>   Micro Data:  BC x 2 10/19>>>  COVID 10/19>>> neg   Antimicrobials:  Rocephin 10/19>>> azithro 10/19>>>   Interim history/subjective:  Severe coughing fits, dry to point of gag/vomit  Objective   Blood pressure 120/70, pulse (!) 110, temperature (!) 97.5 F (36.4 C), temperature source Oral, resp. rate (!) 28, height 5\' 7"  (1.702 m), weight 70.3 kg, SpO2 95 %.  5 lpm NP       No intake or output data in the 24 hours ending 08/12/19 1944 Filed Weights   08/07/19 1504  Weight: 70.3 kg    Examination:  Elderly wf >> stated age looks miserable with moderate increased wob/ use of acc muscles    No jvd Oropharynx clear,  mucosa nl Neck supple Lungs with a few distant wheeze bilaterally, decreased bs / dullness R base  RRR no s3 or or sign murmur Abd soft / nl  excursion  Extr warm with no edema or clubbing noted Neuro  Alert s deficits    pCXR    08/07/2019 :    I personally reviewed images and agree with radiology impression as follows:   Interval increase in a right pleural effusion, now large, with associated atelectasis or consolidation. My impression: hard to tell due to technique but trachea does appear slt pushed to L suggesting more effusion than atx    Resolved Hospital Problem list     Assessment & Plan:  Right lower lobe and hilar mass with associated lymphadenopathy.  Right pleural effusion now found to be positive for adenocarcinoma.  Cytology on her 4R, 36 R both pending but presume these will be malignant. -Appreciate oncology and radiation oncology evaluation.  Planning  for initiation of palliative treatment starting with targeted XRT to address 90% obst of distal BI     2) Incessant dry cough to the poin to point of gagging Assumed to be due to tumor but note occurs on inspiration not expiration and appears to be developing cyclical features with possible secondary gerd sl >>>  Add hydocodone 10 mg q 4 h and use ms prn  >>>  Max rx for gerd >>>  Continue rx for copd/AB with duoneb / steroids   3) 02 dep resp failure secondary to copd and post obst pna R Lung  plus small R malignant effusion >>> in this setting prognosis is very guarded and she is contemplating forgoing life support, certainly longterm, but has not decided on whether she would agree to short term vent so  >>> trx to ICU for possible bipap/ et if needed unless changes to NCB  4) malignant R effusion:  Says no better breathing after prior tap but does look like some element of recurrence and could consider a second tap 10/25      Labs   CBC: Recent Labs  Lab 08/07/19 1556 08/08/19 0523 08/09/19 0556 08/10/19 0557  WBC 19.3* 15.7* 15.7* 16.8*  NEUTROABS 15.1* 11.9*  --   --   HGB 14.6 12.9 13.0 13.2  HCT 45.4 40.1 40.5 41.8  MCV 93.2 94.4 93.1 94.8  PLT 301 274 274 474    Basic Metabolic Panel: Recent Labs  Lab 08/07/19 1556 08/08/19 0523 08/09/19 0556 08/10/19 0557 08/11/19 0536 08/12/19 0605  NA 136 135 132* 131*  --   --   K 3.6 3.8 3.9 4.1  --   --   CL 103 104 101 100  --   --   CO2 21* 22 22 22   --   --   GLUCOSE 128* 111* 122* 108*  --   --   BUN 15 11 10 9   --   --   CREATININE 0.97 0.68 0.71 0.69  --   --   CALCIUM 8.4* 7.7* 7.6* 7.6*  --   --   MG  --  2.2 2.1 1.9 1.9 2.0   GFR: Estimated Creatinine Clearance: 66.4 mL/min (by C-G formula based on SCr of 0.69 mg/dL). Recent Labs  Lab 08/07/19 1556 08/07/19 1557 08/08/19 0523 08/09/19 0556 08/10/19 0557  PROCALCITON  --   --   --  <0.10  --   WBC 19.3*  --  15.7* 15.7* 16.8*  LATICACIDVEN  --  1.8  --   --   --     Liver Function Tests: Recent Labs  Lab 08/07/19 1556 08/09/19 0556 08/10/19 0557  AST 23 21 17   ALT 29 32 25  ALKPHOS 63 48 48  BILITOT 0.8 0.7 0.7  PROT 5.9* 5.1* 5.0*  ALBUMIN 2.8* 2.3* 2.1*   No results for input(s): LIPASE, AMYLASE in the last 168 hours. No results for input(s): AMMONIA in the last 168 hours.  ABG No results found for: PHART, PCO2ART, PO2ART, HCO3, TCO2, ACIDBASEDEF, O2SAT   Coagulation Profile: Recent Labs  Lab 08/08/19 1200  INR 1.0     Cardiac Enzymes: No results for input(s): CKTOTAL, CKMB, CKMBINDEX, TROPONINI in the last 168 hours.  HbA1C: No results found for: HGBA1C  CBG: Recent Labs  Lab 08/09/19 0749  GLUCAP 115*      The patient is critically ill with multiple organ systems failure and requires high complexity decision making for assessment and support, frequent evaluation  and titration of therapies, application of advanced monitoring technologies and extensive interpretation of multiple databases. Critical Care Time devoted to patient care services described in this note is 45 minutes.    Christinia Gully, MD Pulmonary and [place] [phone] After 5:30 PM or weekends, use [facility] [phone]

## 2019-08-12 NOTE — Progress Notes (Addendum)
Addendum  Paged by RN  Patient has progressively worsened thro course of the day> ongoing bouts of dry cough uncontrolled despite polypharmacy, intermittently sleepy and confused due to hypoxia and polypharmacy, poor oral intake due to coughing and dyspnea, progressive dyspnea and hypoxia with increasing oxygen requirement.  I evaluated patient at bedside along with RN.  Spouse at bedside.  Patient looks worse than she did this morning.  Having painful bouts of cough.  Tachypneic.  Tachycardic.  Respiratory system: Left lung fields seem clear to auscultation.  Right lower lung fields with decreased breath sounds.  Rest of right lung fields harsh with occasional rhonchi.  Assessment and plan:  Acute respiratory failure with hypoxia: Progressively worsening respiratory effort and oxygen requirement.  Concerned that she will further decompensate.  She has been refusing nebulizations stating that that is actually making her worse.  Discussed with her and spouse at bedside, they want full code.  Follow-up repeat chest x-ray, stat ordered.  I discussed with Dr. Melvyn Novas, CCM and will transfer patient to stepdown for close monitoring and escalation of treatment if needed.  I discussed in detail with patient's son via phone as well.  Advised him that patient is critically ill.  She may not be able to tolerate the BiPAP.  If she lands up on the ventilator, it is possible that she may not be able to come off of it.  Updated nursing at bedside.  Reviewed CXR> White out R lower lobe. Trachea midline. Possible worsening right pleural effusion along with collapse/consolidation.?  Thoracentesis.  Vernell Leep, MD, FACP, [facility]. [facility]  To contact the attending provider between 7A-7P or the covering provider during after hours 7P-7A, please log into the web site [email or web address] and access using universal [place] password for that web site. If you do not have the password, please call the hospital  operator.

## 2019-08-12 NOTE — Progress Notes (Signed)
RN called RT to check on patient due to shortness of breath. Oxygen level is 94% and RR is 19. Patient is using accessory muscles. Patient refuses breathing treatment at this time. RT will continue to monitor.

## 2019-08-12 NOTE — Progress Notes (Signed)
Dr. Algis Liming at bedside this morning. Aware of tachycardia and R.

## 2019-08-12 NOTE — Progress Notes (Signed)
Patient transferred via bed by two clinical staff to ICU for further treatment and evaluation.

## 2019-08-12 NOTE — Progress Notes (Signed)
Patient is dry heaving. HOB elevated. Ice chips offered. Cool cloth placed to forehead. Attending physician was notified.

## 2019-08-12 NOTE — Progress Notes (Signed)
MD arrived to floor, as pt breathing has worsened. Pt will be transferred to ICU.

## 2019-08-12 NOTE — Progress Notes (Signed)
Patient had change in MEWS score. Current score is 3 based on RR and pulse. Patient has been tachycardic and intermittently tachypneic since admission. Patient is currently having a breathing treatment. Charge nurse made aware. Will continue to monitor.

## 2019-08-12 NOTE — Progress Notes (Signed)
Patient has MEWS score of 3 due to respirations and pulse. Patient is coughing and having SOB. Will continue to monitor.

## 2019-08-12 NOTE — Progress Notes (Signed)
Attempted to give patient Tussionex per physician order. However, per pharmacy, medication cannot be given every 8 hours as ordered. Patient and family advised. Morphine IV administered by charge nurse to patient to assist with labored respirations and cough.

## 2019-08-13 ENCOUNTER — Inpatient Hospital Stay (HOSPITAL_COMMUNITY): Payer: Medicare Other

## 2019-08-13 DIAGNOSIS — J90 Pleural effusion, not elsewhere classified: Secondary | ICD-10-CM | POA: Diagnosis not present

## 2019-08-13 DIAGNOSIS — J9601 Acute respiratory failure with hypoxia: Secondary | ICD-10-CM | POA: Diagnosis not present

## 2019-08-13 DIAGNOSIS — R918 Other nonspecific abnormal finding of lung field: Secondary | ICD-10-CM | POA: Diagnosis not present

## 2019-08-13 DIAGNOSIS — J42 Unspecified chronic bronchitis: Secondary | ICD-10-CM | POA: Diagnosis not present

## 2019-08-13 LAB — COMPREHENSIVE METABOLIC PANEL
ALT: 17 U/L (ref 0–44)
AST: 16 U/L (ref 15–41)
Albumin: 2.4 g/dL — ABNORMAL LOW (ref 3.5–5.0)
Alkaline Phosphatase: 61 U/L (ref 38–126)
Anion gap: 12 (ref 5–15)
BUN: 15 mg/dL (ref 8–23)
CO2: 25 mmol/L (ref 22–32)
Calcium: 8.4 mg/dL — ABNORMAL LOW (ref 8.9–10.3)
Chloride: 97 mmol/L — ABNORMAL LOW (ref 98–111)
Creatinine, Ser: 0.64 mg/dL (ref 0.44–1.00)
GFR calc Af Amer: >60 mL/min (ref >60)
GFR calc non Af Amer: >60 mL/min (ref >60)
Glucose, Bld: 170 mg/dL — ABNORMAL HIGH (ref 70–99)
Potassium: 4.6 mmol/L (ref 3.5–5.1)
Sodium: 134 mmol/L — ABNORMAL LOW (ref 135–145)
Total Bilirubin: 0.7 mg/dL (ref 0.3–1.2)
Total Protein: 5.7 g/dL — ABNORMAL LOW (ref 6.5–8.1)

## 2019-08-13 LAB — CBC
HCT: 43.9 % (ref 36.0–46.0)
Hemoglobin: 13.4 g/dL (ref 12.0–15.0)
MCH: 29.4 pg (ref 26.0–34.0)
MCHC: 30.5 g/dL (ref 30.0–36.0)
MCV: 96.3 fL (ref 80.0–100.0)
Platelets: 323 10*3/uL (ref 150–400)
RBC: 4.56 MIL/uL (ref 3.87–5.11)
RDW: 12 % (ref 11.5–15.5)
WBC: 20.1 10*3/uL — ABNORMAL HIGH (ref 4.0–10.5)
nRBC: 0 % (ref 0.0–0.2)

## 2019-08-13 LAB — CULTURE, BODY FLUID W GRAM STAIN -BOTTLE: Culture: NO GROWTH

## 2019-08-13 LAB — MAGNESIUM: Magnesium: 2.2 mg/dL (ref 1.7–2.4)

## 2019-08-13 MED ORDER — LIDOCAINE 1% INJECTION FOR CIRCUMCISION
2.0000 mL | INJECTION | Freq: Four times a day (QID) | INTRAVENOUS | Status: DC | PRN
  Filled 2019-08-13: qty 2

## 2019-08-13 MED ORDER — ENOXAPARIN SODIUM 40 MG/0.4ML ~~LOC~~ SOLN
40.0000 mg | SUBCUTANEOUS | Status: DC
  Administered 2019-08-14 – 2019-08-15 (×2): 40 mg via SUBCUTANEOUS
  Filled 2019-08-13 (×3): qty 0.4

## 2019-08-13 MED ORDER — ALBUTEROL SULFATE (2.5 MG/3ML) 0.083% IN NEBU
2.5000 mg | INHALATION_SOLUTION | RESPIRATORY_TRACT | Status: DC | PRN
  Administered 2019-08-20 – 2019-08-25 (×3): 2.5 mg via RESPIRATORY_TRACT
  Filled 2019-08-13: qty 3

## 2019-08-13 MED ORDER — OXYCODONE HCL 5 MG PO TABS
10.0000 mg | ORAL_TABLET | Freq: Four times a day (QID) | ORAL | Status: DC
  Administered 2019-08-13 – 2019-08-23 (×23): 10 mg via ORAL
  Filled 2019-08-13 (×31): qty 2

## 2019-08-13 MED ORDER — SODIUM CHLORIDE 0.45 % IV SOLN
INTRAVENOUS | Status: DC
  Administered 2019-08-13 – 2019-08-14 (×2): via INTRAVENOUS
  Administered 2019-08-15: 50 mL/h via INTRAVENOUS
  Administered 2019-08-16 – 2019-08-19 (×4): via INTRAVENOUS

## 2019-08-13 MED ORDER — ORAL CARE MOUTH RINSE
15.0000 mL | Freq: Two times a day (BID) | OROMUCOSAL | Status: DC
  Administered 2019-08-13 – 2019-08-27 (×25): 15 mL via OROMUCOSAL

## 2019-08-13 MED ORDER — HYDROCOD POLST-CPM POLST ER 10-8 MG/5ML PO SUER
5.0000 mL | Freq: Four times a day (QID) | ORAL | Status: DC
  Administered 2019-08-13 – 2019-08-30 (×63): 5 mL via ORAL
  Filled 2019-08-13 (×68): qty 5

## 2019-08-13 MED ORDER — HYDROCOD POLST-CPM POLST ER 10-8 MG/5ML PO SUER: 5.0000 mL | ORAL | Status: DC

## 2019-08-13 MED ORDER — LIDOCAINE HCL (PF) 1 % IJ SOLN
INTRAMUSCULAR | Status: AC
  Administered 2019-08-13: 2 mL
  Filled 2019-08-13: qty 5

## 2019-08-13 MED ORDER — IPRATROPIUM-ALBUTEROL 0.5-2.5 (3) MG/3ML IN SOLN
3.0000 mL | Freq: Four times a day (QID) | RESPIRATORY_TRACT | Status: DC
  Administered 2019-08-13 – 2019-08-22 (×31): 3 mL via RESPIRATORY_TRACT
  Filled 2019-08-13 (×36): qty 3

## 2019-08-13 MED ORDER — LIDOCAINE HCL (PF) 1 % IJ SOLN
2.0000 mL | Freq: Four times a day (QID) | INTRAMUSCULAR | Status: DC | PRN
  Administered 2019-08-13: 2 mL
  Administered 2019-08-13: 20:00:00
  Administered 2019-08-13 – 2019-08-18 (×9): 2 mL
  Filled 2019-08-13 (×6): qty 5

## 2019-08-13 MED ORDER — GABAPENTIN 100 MG PO CAPS
100.0000 mg | ORAL_CAPSULE | Freq: Three times a day (TID) | ORAL | Status: DC
  Administered 2019-08-13 – 2019-08-29 (×45): 100 mg via ORAL
  Filled 2019-08-13 (×49): qty 1

## 2019-08-13 MED ORDER — METHYLPREDNISOLONE SODIUM SUCC 125 MG IJ SOLR
80.0000 mg | Freq: Three times a day (TID) | INTRAMUSCULAR | Status: DC
  Administered 2019-08-13 – 2019-08-15 (×6): 80 mg via INTRAVENOUS
  Filled 2019-08-13 (×6): qty 2

## 2019-08-13 NOTE — Progress Notes (Signed)
NAME:  Jordan Clay, MRN:  [id number], DOB:  10-Sep-1951, LOS: 6 ADMISSION DATE:  08/07/2019, CONSULTATION DATE:  10/20 REFERRING MD:  Reesa Chew (THR) , CHIEF COMPLAINT:  Cough, SOB  Brief History   68 yo female smoker (40 pack years, quit 1 week PTA when she got pneumonia) with hx COPD admitted 10/19 for postobstructive PNA.  History of present illness   68 year old female with history of COPD presented 10/19 to ER with cough and shortness of breath x 2 weeks.  She was treated by her PCP for pneumonia but did not tolerate the antibiotics and was switched to [facility] 2 days prior to admission.  She continues to have severe cough and dyspnea despite this.  Cough is nonproductive.  Unsure if she has had fever.  Has not slept well x 1 week PTA related to persistent cough.  No sick contacts that she is aware of.  Past Medical History   has a past medical history of COPD (chronic obstructive pulmonary disease) ([place]), High cholesterol, and Pneumonia.   Significant Hospital Events   Transferred to ICU pm 10/24 for cough > sob   Consults:  PCCM 10/20>>>   Procedures:  IR R thoracentesis 10/20 > x 700 cc  No improvement is sob or cough/ cyt pos adenocarcinoma of the lung Bronchoscopy 10/21: 90% obst distal BI/ with endobronchial ultrasound 10/21 >>>  Significant Diagnostic Tests:  CTA chest 10/19>>> No evidence of pulmonary embolism.  Obstruction of central right lower lobe bronchus and central low-attenuation, suspicious for centrally obstructing mass.  Near complete right lower lobe consolidation, suspicious for postobstructive pneumonitis. Moderate right pleural effusion.  New bulky mediastinal and right hilar lymphadenopathy, consistent with metastatic disease.  Stable sub-cm bilateral upper lobe and right middle lobe pulmonary Nodules. Cytology 4R 10/21>>  Cytology 11 R 10/21>>   Micro Data:  BC x 2 10/19>>> neg  COVID 10/19 pcr>>> neg  Pleural fluid  10/20 >> neg  BAL 10/21 > neg   Antimicrobials:  Rocephin 10/19>>> azithro 10/19>>>  Interim history/subjective:  Apparently had a better night but slept thru her am dose of hydrocodone and had coughing fit on my arrival stating "tussionex is the only thing that works" (previously reported coughing x 2 weeks and nothing worked"  Refusing to consider placement of chest tube or thoracentesis "it didn't help before"  Objective   Blood pressure (!) 131/53, pulse 100, temperature 97.7 F (36.5 C), temperature source Oral, resp. rate 18, height _0  (1.702 m), weight 70.3 kg, SpO2 (!) 88 %.  12 lpm NP        Intake/Output Summary (Last 24 hours) at 08/13/2019 0802 Last data filed at 08/13/2019 0227 Gross per 24 hour  Intake 3 ml  Output 75 ml  Net -72 ml   Filed Weights   08/07/19 1504  Weight: 70.3 kg    Examination:  Elderly wf with hacking cough dry early on inspiration   Pt alert, quite anxious but appropriate No jvd Oropharynx clear,  mucosa nl Neck supple Lungs with a few scattered exp > insp rhonchi on  RRR no s3 or or sign murmur Abd soft , nl excursion  Extr warm with no edema or clubbing noted Neuro  Sensorium knows hosp name and day of week off by one day on date     Resolved Hospital Problem list     Assessment & Plan:  Right lower lobe and hilar mass = adenoca  with associated lymphadenopathy.  Right pleural effusion now  found to be positive for adenocarcinoma.  Cytology on her 4R, 63 R both pending but presume these will be malignant. -Appreciate oncology and radiation oncology evaluation.  Planning for initiation of palliative treatment starting with targeted XRT to address 90% obst of distal BI     2) Incessant dry cough to the poin to point of gagging  - refusing neb treatments "they make me cough "  > rec try lidocaine with them  - now reporting tussionex does work but not clear why she said repeatedly last night "nothing worked"  So rec add back tussionex 1 tsp q 6h to prevent  cyclical cough    3) 02 dep resp failure secondary to copd and post obst pna R Lung plus small R malignant effusion >>> adjust flow to keep sats > 90%, continue nebs as she allows, steroids IV  4) malignant R effusion:  Says no better breathing after prior tap but does look like some element of recurrence and I recommended a repeat thoracentesis or short term chest tube but she refused as they were not beneficial  - the same  Could be said for ET/ mech vent if she continues to deteriorate and refusing the lesser interventions that might prevent her from requiring Et but she is adamant and nothing else to offer at this point.      Labs   CBC: Recent Labs  Lab 08/07/19 1556 08/08/19 0523 08/09/19 0556 08/10/19 0557 08/13/19 0244  WBC 19.3* 15.7* 15.7* 16.8* 20.1*  NEUTROABS 15.1* 11.9*  --   --   --   HGB 14.6 12.9 13.0 13.2 13.4  HCT 45.4 40.1 40.5 41.8 43.9  MCV 93.2 94.4 93.1 94.8 96.3  PLT 301 274 274 263 401    Basic Metabolic Panel: Recent Labs  Lab 08/07/19 1556  08/08/19 0523 08/09/19 0556 08/10/19 0557 08/11/19 0536 08/12/19 0605 08/13/19 0244  NA 136  --  135 132* 131*  --   --  134*  K 3.6  --  3.8 3.9 4.1  --   --  4.6  CL 103  --  104 101 100  --   --  97*  CO2 21*  --  _0 --   --  25  GLUCOSE 128*  --  111* 122* 108*  --   --  170*  BUN 15  --  _1 --   --  15  CREATININE 0.97  --  0.68 0.71 0.69  --   --  0.64  CALCIUM 8.4*  --  7.7* 7.6* 7.6*  --   --  8.4*  MG  --    < > 2.2 2.1 1.9 1.9 2.0 2.2   < > = values in this interval not displayed.   GFR: Estimated Creatinine Clearance: 66.4 mL/min (by C-G formula based on SCr of 0.64 mg/dL). Recent Labs  Lab 08/07/19 1557 08/08/19 0523 08/09/19 0556 08/10/19 0557 08/13/19 0244  PROCALCITON  --   --  <0.10  --   --   WBC  --  15.7* 15.7* 16.8* 20.1*  LATICACIDVEN 1.8  --   --   --   --     Liver Function Tests: Recent Labs  Lab 08/07/19 1556 08/09/19 0556 08/10/19 0557 08/13/19  0244  AST _2 ALT 29 32 25 17  ALKPHOS 63 48 48 61  BILITOT 0.8 0.7 0.7 0.7  PROT 5.9* 5.1* 5.0* 5.7*  ALBUMIN 2.8*  2.3* 2.1* 2.4*   No results for input(s): LIPASE, AMYLASE in the last 168 hours. No results for input(s): AMMONIA in the last 168 hours.  ABG No results found for: PHART, PCO2ART, PO2ART, HCO3, TCO2, ACIDBASEDEF, O2SAT   Coagulation Profile: Recent Labs  Lab 08/08/19 1200  INR 1.0    Cardiac Enzymes: No results for input(s): CKTOTAL, CKMB, CKMBINDEX, TROPONINI in the last 168 hours.  HbA1C: No results found for: HGBA1C  CBG: Recent Labs  Lab 08/09/19 0749  GLUCAP 115*     The patient is critically ill with acute hypoxemic resp failure requires high complexity decision making for assessment and support, frequent evaluation and titration of therapies, application of advanced monitoring technologies and extensive interpretation of multiple databases. Critical Care Time devoted to patient care services described in this note is 45 minutes.    Christinia Gully, MD Pulmonary and [place] [phone] After 5:30 PM or weekends, use Beeper [phone]

## 2019-08-13 NOTE — Progress Notes (Signed)
PROGRESS NOTE   Jordan Clay  GHW:[id number]    DOB: 1951/05/11    DOA: 08/07/2019  PCP: Hulan Fess, MD   I have briefly reviewed patients previous medical records in [facility].  Chief Complaint  Patient presents with   COPD   low sats    Brief Narrative:  68 year old female with PMH of, tobacco abuse, HLD, presented to the ED on 10/19 due to cough and progressive dyspnea of 2 weeks duration.  She was recently treated by her PCP for pneumonia but symptoms persisted despite this.  CT chest was negative for pulmonary embolism but showed mass in the right lower lobe with obstruction and bulky chest lymphadenopathy and right pleural effusion.  IR and pulmonology consulted.  S/p right thoracentesis 700 mL.  S/p bronchoscopy 10/21 that showed right lower lobe mass with hilar mediastinal adenopathy.  Preliminary cytopathology results consistent with adenocarcinoma.  Medical oncology consulting and coordinating care.  Radiation oncology consulted for palliative radiation.  Due to worsening respiratory status, transferred to stepdown unit/ICU on 10/24 evening.  S/p right sided chest tube placement 10/25 for recurrent malignant pleural effusion.  Care transferred over from [facility] to PCCM on 10/25.    TRH will sign off at this time.  Please reconsult TRH for any further assistance.   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  Probable pneumonia.  CT chest recommended.  CTA chest 10/19: No PE.  Obstruction of central right lower lobe  bronchus suspicious for centrally obstructing mass.  Near complete RLL consolidation, suspicious for postobstructive pneumonitis.  Moderate right pleural effusion.  New bulky mediastinal and right hilar lymphadenopathy consistent with metastatic disease.  S/p ultrasound-guided right thoracentesis 700 mL by IR on 10/20.    S/p bronchoscopy with detail findings as below, biopsy and brushings.  Has been on IV ceftriaxone and azithromycin for postobstructive pneumonia since 10/19.  Completed azithromycin.  Complete IV ceftriaxone after 7 days.  CT abdomen shows lymphadenopathy concerning for stage IV cancer.  Oncology and pulmonary input appreciated.   As per oncology follow-up, pathology from her biopsy as well as fluid cytology positive for adenocarcinoma/stage IV non-small cell lung cancer.  Planning palliative radiation while hospitalized (likely to start Monday next week) to address partially obstructive lung mass with associated cough and dyspnea followed by systemic therapy after results of her molecular testing.  Patient's respiratory status declined 10/24 evening.  CCM was consulted.  Patient was transferred to SCU/ICU.  Main reason for her decline were increasing right-sided recurrent malignant pleural effusion, intractable cough, associated dyspnea and anxiety.  Overnight she was placed on scheduled hydrocodone, lidocaine prior to nebulizations.  This seemed to help her cough.  This morning Tussionex dose increased by CCM.  Patient initially declined thoracentesis or chest tube placement.  However after I discussed in detail with her again, she was agreeable to placement of right-sided chest tube.  Communicated with patient's RN and Dr. Melvyn Novas.  Acute respiratory failure with hypoxia  Secondary to suspected lung cancer, postobstructive pneumonia and pleural effusion.  Treat underlying cause.  Oxygen supplementation.  Target for oxygen saturations >92%.  Discussed CODE STATUS in detail  yesterday with patient and family.  Wishes to remain full code.  COPD  No clinical bronchospasm.  Continue as needed bronchodilators.  CCM added IV steroids on 10/24.  Tobacco abuse  Recently quit.  Cessation counseled.  Prolonged QT interval  EKG on admission showed QTC of 543 ms.  EKG 10/22: QTC 435 ms.  Resolved.  Follow EKG periodically.  Sinus tachycardia  Mild.  Likely multifactorial due to ongoing cough, hypoxia.  Asymptomatic.   DVT prophylaxis: SCDs Code Status: Full Family Communication: I discussed in detail with patient spouse at bedside and then with patient's son via phone on 10/24 prior to transferring her to stepdown unit.  None at bedside today. Disposition: To be determined pending hospital course.  Will remain in the hospital until next week to initiate radiation treatment.  Patient transferred to stepdown unit on 10/24.  Care transferred from [facility] to CCM on 10/5.   Consultants:  IR PCCM Medical oncology  Procedures:  Ultrasound-guided right thoracentesis by IR 10/20 Bronchoscopy by pulmonology on 10/21 with findings as below  Impression - Right lower lobe mass - Right hilar mass - Mediastinal mass - The airway examination of the left lung was normal. - A narrowing was found in the right middle lobe and in the right lower lobe. The lesion has a malignant appearance. - Endobronchial ultrasound was performed > enlarged nodes at 4R, 7, 10R, 11R. - A transbronchial needle aspiration was performed at 4R and 7. - Brushings were obtained from RLL bronchus. - Bronchoalveolar lavage was performed in RLL.  Antimicrobials:  IV ceftriaxone and azithromycin  Cytopathology  Right pleural fluid: Malignant cells consistent with metastatic adenocarcinoma.  FINAL MICROSCOPIC DIAGNOSIS:  A. LYMPH NODE, STATION 7, FINE NEEDLE ASPIRATION:  - Malignant cells consistent with non-small cell carcinoma.   B. LYMPH NODE, 11R, FINE NEEDLE ASPIRATION:  - Malignant  cells consistent with non-small cell carcinoma.   C. LUNG, RIGHT LOWER LOBE, BRUSHINGS:  - Malignant cells consistent with non-small cell carcinoma.    Subjective: Patient reports that Tussionex and lidocaine prior to breathing treatments helped her cough and breathing.  Slept well through the night.  Still has some dyspnea and intermittent coughing.  Denied any other complaints.  Objective:  Vitals:   08/13/19 0700 08/13/19 0707 08/13/19 0800 08/13/19 0837  BP: (!) 131/53  (!) 115/45   Pulse: 100  (!) 110   Resp: 18  (!) 23   Temp:  97.7 F (36.5 C)    TempSrc:  Oral    SpO2: (!) 88%  (!) 88% 90%  Weight:      Height:      BP: 131/78, RR 20/min.  Examination:  General exam: Middle-age female, moderately built and nourished, ill looking, lying/sleeping propped up in bed without distress.  Looks better than she did last evening.  Able to speak without coughing as much. Respiratory system: Reduced breath sounds in right base.  Rest slightly harsh with occasional rhonchi.  No crackles.  No increased work of breathing. Cardiovascular system: S1 and S2 heard, regular tachycardia.  No JVD, murmurs or pedal edema.  Telemetry personally reviewed: Sinus tachycardia in the 100s/110s. Gastrointestinal system: Abdomen is nondistended, soft and nontender. No organomegaly or masses felt. Normal bowel sounds heard. Central nervous system: Alert and oriented x3.  At times appears slightly confused.  No focal neurological deficits. Extremities: Symmetric 5 x 5 power.?  Tip of fingers cyanosis. Skin: No  rashes, lesions or ulcers Psychiatry: Judgement and insight appear normal. Mood & affect appropriate.     Data Reviewed: I have personally reviewed following labs and imaging studies  CBC: Recent Labs  Lab 08/07/19 1556 08/08/19 0523 08/09/19 0556 08/10/19 0557 08/13/19 0244  WBC 19.3* 15.7* 15.7* 16.8* 20.1*  NEUTROABS 15.1* 11.9*  --   --   --   HGB 14.6 12.9 13.0 13.2 13.4  HCT 45.4  40.1 40.5 41.8 43.9  MCV 93.2 94.4 93.1 94.8 96.3  PLT 301 274 274 263 983   Basic Metabolic Panel: Recent Labs  Lab 08/07/19 1556  08/08/19 0523 08/09/19 0556 08/10/19 0557 08/11/19 0536 08/12/19 0605 08/13/19 0244  NA 136  --  135 132* 131*  --   --  134*  K 3.6  --  3.8 3.9 4.1  --   --  4.6  CL 103  --  104 101 100  --   --  97*  CO2 21*  --  _0 --   --  25  GLUCOSE 128*  --  111* 122* 108*  --   --  170*  BUN 15  --  _1 --   --  15  CREATININE 0.97  --  0.68 0.71 0.69  --   --  0.64  CALCIUM 8.4*  --  7.7* 7.6* 7.6*  --   --  8.4*  MG  --    < > 2.2 2.1 1.9 1.9 2.0 2.2   < > = values in this interval not displayed.   Liver Function Tests: Recent Labs  Lab 08/07/19 1556 08/09/19 0556 08/10/19 0557 08/13/19 0244  AST _2 ALT 29 32 25 17  ALKPHOS 63 48 48 61  BILITOT 0.8 0.7 0.7 0.7  PROT 5.9* 5.1* 5.0* 5.7*  ALBUMIN 2.8* 2.3* 2.1* 2.4*    Cardiac Enzymes: No results for input(s): CKTOTAL, CKMB, CKMBINDEX, TROPONINI in the last 168 hours.  CBG: Recent Labs  Lab 08/09/19 0749  GLUCAP 115*    Recent Results (from the past 240 hour(s))  Urine culture     Status: Abnormal   Collection Time: 08/07/19  3:57 PM   Specimen: Urine, Random  Result Value Ref Range Status   Specimen Description   Final    URINE, RANDOM Performed at [facility] [address] [place], [place] [address]    Special Requests   Final    NONE Performed at [facility], [place] [address], [place], [place] [address]    Culture (A)  Final    <10,000 COLONIES/mL INSIGNIFICANT GROWTH Performed at [facility] [address] [place], [place] [address]    Report Status 08/08/2019 FINAL  Final  Culture, blood (routine x 2)     Status: None   Collection Time: 08/07/19  3:57 PM   Specimen: BLOOD  Result Value Ref Range Status   Specimen Description   Final    BLOOD LEFT ANTECUBITAL Performed at [facility] [address], [place], [place] [address]    Special Requests   Final    BOTTLES DRAWN AEROBIC AND ANAEROBIC Blood Culture results may not be optimal due to an inadequate volume of blood received in culture bottles Performed at [facility] [address] [place], [place] [address]    Culture   Final    NO GROWTH 5 DAYS Performed at [facility], [place] [address], [place], [place] [address]    Report Status 08/12/2019 FINAL  Final  Culture, blood (routine x 2)     Status: None   Collection Time: 08/07/19  4:39 PM   Specimen: BLOOD  Result Value Ref Range Status   Specimen Description   Final    BLOOD LEFT ANTECUBITAL Performed at [facility] [address], [place], [place] [address]    Special Requests   Final    BOTTLES DRAWN AEROBIC AND ANAEROBIC Blood Culture adequate volume Performed at [facility] [address], [place], [place] [address]    Culture   Final    NO GROWTH 5 DAYS Performed at [facility], [place] [address] [place], [place] [address]    Report Status 08/12/2019 FINAL  Final  SARS CORONAVIRUS 2 (TAT 6-24 HRS) Nasopharyngeal Nasopharyngeal Swab     Status: None   Collection Time: 08/07/19  4:39 PM   Specimen: Nasopharyngeal Swab  Result Value Ref Range Status   SARS Coronavirus 2 NEGATIVE NEGATIVE Final    Comment: (NOTE) SARS-CoV-2 target nucleic acids are NOT DETECTED. The SARS-CoV-2 RNA is generally detectable in upper and lower respiratory specimens during the acute phase of infection. Negative results do not preclude SARS-CoV-2 infection, do not rule out co-infections with other pathogens, and should not be used as the sole basis for treatment or other patient management decisions. Negative results must be combined with clinical observations, patient history, and epidemiological information. The expected result is Negative. Fact Sheet for  Patients: SugarRoll.be Fact Sheet for Healthcare Providers: https://www.woods-mathews.com/ This test is not yet approved or cleared by the [place] FDA and  has been authorized for detection and/or diagnosis of SARS-CoV-2 by FDA under an Emergency Use Authorization (EUA). This EUA will remain  in effect (meaning this test can be used) for the duration of the COVID-19 declaration under Section 56 4(b)(1) of the Act, 21 U.S.C. section 360bbb-3(b)(1), unless the authorization is terminated or revoked sooner. Performed at [facility], [place] [address], [place], [place] [address]   Culture, body fluid-bottle     Status: None (Preliminary result)   Collection Time: 08/08/19 10:05 AM   Specimen: Pleura  Result Value Ref Range Status   Specimen Description PLEURAL  Final   Special Requests NONE  Final   Culture   Final    NO GROWTH 4 DAYS Performed at [facility], [address], [place], [place] [address]    Report Status PENDING  Incomplete  Gram stain     Status: None   Collection Time: 08/08/19 10:05 AM   Specimen: Pleura  Result Value Ref Range Status   Specimen Description PLEURAL  Final   Special Requests NONE  Final   Gram Stain   Final    MODERATE WBC PRESENT, PREDOMINANTLY MONONUCLEAR NO ORGANISMS SEEN Performed at [facility], [place] [address] [place], [place] [address]    Report Status 08/08/2019 FINAL  Final  Culture, bal-quantitative     Status: None   Collection Time: 08/09/19 11:59 AM   Specimen: Bronchoalveolar Lavage; Respiratory  Result Value Ref Range Status   Specimen Description   Final    BRONCHIAL ALVEOLAR LAVAGE RLL Performed at [facility] [address] [place], [place] [address]    Special Requests   Final    Normal Performed at [facility], [place] [address], [place], [place] [address]  Gram Stain   Final    FEW WBC PRESENT,BOTH PMN AND  MONONUCLEAR NO ORGANISMS SEEN    Culture   Final    NO GROWTH 2 DAYS Performed at [facility] [address] [place], [place] [address]    Report Status 08/11/2019 FINAL  Final         Radiology Studies: Dg Chest Port 1 View  Result Date: 08/12/2019 CLINICAL DATA:  Coughing, shortness of breath EXAM: PORTABLE CHEST 1 VIEW COMPARISON:  Chest radiographs, 08/08/2019, CT chest, 08/07/2019 FINDINGS: Interval increase in a right pleural effusion, now large, with associated atelectasis or consolidation. The heart and mediastinum are predominantly obscured. The left lung is normally aerated. IMPRESSION: Interval increase in a right pleural effusion, now large, with associated atelectasis or consolidation. Findings are consistent with a malignant effusion associated with a right hilar mass better appreciated by prior CT. Electronically Signed   By: Eddie Candle M.D.   On: 08/12/2019 19:20        Scheduled Meds:  Chlorhexidine Gluconate Cloth  6 each Topical Daily   chlorpheniramine-HYDROcodone  5 mL Oral Q6H   enoxaparin (LOVENOX) injection  40 mg Subcutaneous Q24H   gabapentin  100 mg Oral TID   ipratropium-albuterol  3 mL Nebulization QID   mouth rinse  15 mL Mouth Rinse BID   methylPREDNISolone (SOLU-MEDROL) injection  80 mg Intravenous Q8H   oxyCODONE  10 mg Oral Q6H   pantoprazole  40 mg Oral BID AC   polyethylene glycol  17 g Oral BID   sodium chloride flush  3 mL Intravenous Q12H   Continuous Infusions:  sodium chloride     cefTRIAXone (ROCEPHIN)  IV 2 g (08/12/19 1318)     LOS: 6 days     Vernell Leep, MD, FACP, [facility]. [facility]  To contact the attending provider between 7A-7P or the covering provider during after hours 7P-7A, please log into the web site [email or web address] and access using universal [place] password for that web site. If you do not have the password, please call the hospital operator.  08/13/2019, 11:33 AM

## 2019-08-13 NOTE — Progress Notes (Signed)
Patient placed on [place] @ 25L and 100%; patient O2 sats slow to recover and were 88-90% from 83-86%. Patient comfortable with the flow, but was not comfortable with the humidification being heated from heater; heater turned off after attempting to reduce temp for comfort. While in room titrating [place], patient and family educated on device; one son at bedside hesitant to increase flow past 25L; patient and family educated that the amount needed is based on patient needs at that time and that flow and FiO2 will be decreased as tolerated by patient; patient and family seemed satisfied with education. RT will continue to monitor patient and wean as tolerated.

## 2019-08-13 NOTE — Progress Notes (Signed)
[facility] signoff note   The care of this patient has been transferred from the [facility] service to the following service:  Service: PCCM Attending: Dr. Christinia Gully. I have discussed with Dr. Christinia Gully Date & time: 08/13/2019, 4:23 PM   Please re consult the [facility] team for any further assistance.  Thank you   [employer]. MD [facility]   To contact a [facility] provider, please log into the web site [email or web address] and access using universal [place] password for that web site. If you do not have the password, please call the hospital operator. Page "[email or web address]" listed on top of the page. Provide a number where you can be directly reached.

## 2019-08-13 NOTE — Procedures (Signed)
Indications:  Symptomatic malignant R pleural effusion  Permit:  Per pt  With son's input over the phone re risk/benefit/alternatives (refusing thoracentesis as it it did not help)  Sterile prep/ drape/ L lat decub position = 45 degrees   MS 4 mg IV during procedure for pain control   #20 fr chest tube place around 5th or 6 th ics at post ax line and directe posteriorly and medially with immediate spont flow with resp variation = 500 immediate serosanguinous  Sutured in place at 10 cm  Sterile dressing applied  Will leave on water seal for now/ pCXR pending  Pt tol well.   Christinia Gully, MD Pulmonary and [place] [phone] After 5:30 PM or weekends, use [facility] [phone]

## 2019-08-14 ENCOUNTER — Ambulatory Visit
Admit: 2019-08-14 | Discharge: 2019-08-14 | Disposition: A | Discharge status: Home / Self Care | Payer: Medicare Other | Attending: Radiation Oncology | Admitting: Radiation Oncology

## 2019-08-14 ENCOUNTER — Inpatient Hospital Stay (HOSPITAL_COMMUNITY): Payer: Medicare Other

## 2019-08-14 DIAGNOSIS — C3491 Malignant neoplasm of unspecified part of right bronchus or lung: Secondary | ICD-10-CM | POA: Insufficient documentation

## 2019-08-14 DIAGNOSIS — R918 Other nonspecific abnormal finding of lung field: Secondary | ICD-10-CM | POA: Diagnosis not present

## 2019-08-14 DIAGNOSIS — C3431 Malignant neoplasm of lower lobe, right bronchus or lung: Secondary | ICD-10-CM | POA: Insufficient documentation

## 2019-08-14 DIAGNOSIS — J9601 Acute respiratory failure with hypoxia: Secondary | ICD-10-CM | POA: Diagnosis not present

## 2019-08-14 DIAGNOSIS — J189 Pneumonia, unspecified organism: Secondary | ICD-10-CM | POA: Diagnosis not present

## 2019-08-14 LAB — GLUCOSE, CAPILLARY
Glucose-Capillary: 162 mg/dL — ABNORMAL HIGH (ref 70–99)
Glucose-Capillary: 185 mg/dL — ABNORMAL HIGH (ref 70–99)
Glucose-Capillary: 190 mg/dL — ABNORMAL HIGH (ref 70–99)
Glucose-Capillary: 220 mg/dL — ABNORMAL HIGH (ref 70–99)

## 2019-08-14 LAB — MRSA PCR SCREENING: MRSA by PCR: NEGATIVE

## 2019-08-14 LAB — HEMOGLOBIN A1C
Hgb A1c MFr Bld: 5.9 % — ABNORMAL HIGH (ref 4.8–5.6)
Mean Plasma Glucose: 122.63 mg/dL

## 2019-08-14 MED ORDER — SODIUM CHLORIDE 0.9 % IV SOLN
3.0000 g | Freq: Four times a day (QID) | INTRAVENOUS | Status: AC
  Administered 2019-08-14 – 2019-08-19 (×20): 3 g via INTRAVENOUS
  Filled 2019-08-14 (×4): qty 3
  Filled 2019-08-14: qty 8
  Filled 2019-08-14 (×4): qty 3
  Filled 2019-08-14 (×2): qty 8
  Filled 2019-08-14: qty 3
  Filled 2019-08-14: qty 8
  Filled 2019-08-14 (×4): qty 3
  Filled 2019-08-14: qty 8
  Filled 2019-08-14 (×2): qty 3

## 2019-08-14 MED ORDER — INSULIN ASPART 100 UNIT/ML ~~LOC~~ SOLN
0.0000 [IU] | SUBCUTANEOUS | Status: DC
  Administered 2019-08-14: 3 [IU] via SUBCUTANEOUS
  Administered 2019-08-14 (×2): 2 [IU] via SUBCUTANEOUS
  Administered 2019-08-15: 23:00:00 1 [IU] via SUBCUTANEOUS
  Administered 2019-08-15: 2 [IU] via SUBCUTANEOUS
  Administered 2019-08-15: 1 [IU] via SUBCUTANEOUS
  Administered 2019-08-15 – 2019-08-16 (×5): 2 [IU] via SUBCUTANEOUS
  Administered 2019-08-16 – 2019-08-19 (×6): 1 [IU] via SUBCUTANEOUS
  Administered 2019-08-20: 2 [IU] via SUBCUTANEOUS
  Administered 2019-08-20 – 2019-08-21 (×5): 1 [IU] via SUBCUTANEOUS
  Administered 2019-08-22 (×2): 2 [IU] via SUBCUTANEOUS
  Administered 2019-08-22: 1 [IU] via SUBCUTANEOUS
  Administered 2019-08-23: 3 [IU] via SUBCUTANEOUS
  Administered 2019-08-23 – 2019-08-25 (×4): 1 [IU] via SUBCUTANEOUS
  Administered 2019-08-25: 2 [IU] via SUBCUTANEOUS
  Administered 2019-08-25: 1 [IU] via SUBCUTANEOUS
  Administered 2019-08-25: 2 [IU] via SUBCUTANEOUS
  Administered 2019-08-26 – 2019-08-29 (×6): 1 [IU] via SUBCUTANEOUS

## 2019-08-14 NOTE — Progress Notes (Signed)
eLink Physician-Brief Progress Note Patient Name: LETRICIA KRINSKY DOB: 06-17-1951 MRN: [id number]   Date of Service  08/14/2019  HPI/Events of Note  Dr. Bari Mantis note from today indicates that the family has agreed to DNR status with continued care at present level. Unfortunately, Dr. Elsworth Soho did not write this order. I am requested to change patient code status.   eICU Interventions  Will need to verify code status change with husband prior to changing code status.      Intervention Category Major Interventions: End of life / care limitation discussion  Lysle Dingwall 08/14/2019, 8:50 PM

## 2019-08-14 NOTE — Progress Notes (Signed)
Attempted to place pt on nrb and nasal cannula pt and family was not ready. They want to wait another 1 hour and 1/2 before doing the trial.

## 2019-08-14 NOTE — Progress Notes (Signed)
Extensive discussion with patient and 4 family members including her 2 sons, daughter and husband. Reasons for hypoxia outlined, emphasized that radiation is palliative and she is not a candidate currently for chemotherapy.  Molecular testing is pending Patient clearly expressed that she would not want life support measures especially if there was no improvement in quality of life.  Family, after some discussion, was willing to accept this.  Will issue DNR orders.  Full medical care. I discussed difficulty of transporting on high flow oxygen-we will attempt nonrebreather plus high flow cannula and see if she will maintain saturations for transport for radiation. If not,can also trial BiPAP  Based on success with above above, will involve palliative care for further goals of care discussion  Additional critical care time x 48mins  Jermond Burkemper V. Elsworth Soho MD

## 2019-08-14 NOTE — Progress Notes (Signed)
Pharmacy Antibiotic Note  Jordan Clay is a 68 y.o. female presented to the ED on 08/07/2019 with c/o cough and SOB.  Chest CTA showed lung mass and pleural effusion (s/p right sided chest tube placement on 10/25 for malignant effusion). She was on azithro/CTX for PNA from 10/19-10/25. Pharmacy is consulted to start unasyn for patient on 10/26 for suspected postobstructive PNA.  Plan: - unasyn 3gm IV q6h  - With stable renal function, pharmacy will sign off for abx consult.  Reconsult [place] if need further assistance.  ___________________________________  Height: _0  (170.2 cm) Weight: 155 lb (70.3 kg) IBW/kg (Calculated) : 61.6  Temp (24hrs), Avg:98.1 F (36.7 C), Min:97.8 F (36.6 C), Max:98.4 F (36.9 C)  Recent Labs  Lab 08/07/19 1556 08/07/19 1557 08/08/19 0523 08/09/19 0556 08/10/19 0557 08/13/19 0244  WBC 19.3*  --  15.7* 15.7* 16.8* 20.1*  CREATININE 0.97  --  0.68 0.71 0.69 0.64  LATICACIDVEN  --  1.8  --   --   --   --     Estimated Creatinine Clearance: 66.4 mL/min (by C-G formula based on SCr of 0.64 mg/dL).    Allergies  Allergen Reactions  . Ciprocin-Fluocin-Procin [Fluocinolone]   . Minocycline Cough  . Sulfa Antibiotics Cough  . Levofloxacin Nausea And Vomiting and Anxiety    Antimicrobials this admission:  10/19 azithro>>10/24 10/19 CTX>> 10/25 10/26 unasyn>>   Microbiology results:  10/19 BCx: neg FINAL 10/19 UCx: insign growth FINAL 10/20 pleural fluid: neg FINAL 10/21 bal: neg FINAL 10/26 MRSA PCR: neg  Thank you for allowing pharmacy to be a part of this patient's care.  Lynelle Doctor 08/14/2019 10:15 AM

## 2019-08-14 NOTE — Progress Notes (Signed)
[facility] Therapy Treatment Record Phone [phone]   Radiation Therapy was administered to Myriam Forehand on: 08/14/2019  4:41 PM and was treatment # 1 out of a planned course of 10 treatments.  Radiation Treatment  1). Beam photons with 6-10 energy  2). Brachytherapy None  3). Stereotactic Radiosurgery None  4). Other Radiation None     Offie Pickron, Zakari Couchman, RT (T)

## 2019-08-14 NOTE — Progress Notes (Signed)
Per Dr. Oletta Darter Warren Lacy) patients husband is NOT agreeable to DNR orders at this time and wishes for FULL CODE status, including intubation, if necessary. Patient to remain FULL CODE at this time. Charge/Rapid Response RN and patients RN aware of this status. Will continue to monitor.

## 2019-08-14 NOTE — Progress Notes (Signed)
[place] Progress Note Patient Name: Jordan Clay DOB: 13-Jun-1951 MRN: [id number]   Date of Service  08/14/2019  HPI/Events of Note  Discussion with Mr. Rieger about DNR status. He is does not agree to no CPR. He is not in favor of long term intubation. He states that he wants everything done if there is any possibility of survival.   eICU Interventions  I will not alter code status based on this discussion with the husband.     Intervention Category Major Interventions: End of life / care limitation discussion  Lysle Dingwall 08/14/2019, 9:02 PM

## 2019-08-14 NOTE — Progress Notes (Signed)
NAME:  Jordan Clay, MRN:  [id number], DOB:  Mar 20, 1951, LOS: 7 ADMISSION DATE:  08/07/2019, CONSULTATION DATE:  10/20 REFERRING MD:  Reesa Chew (THR) , CHIEF COMPLAINT:  Cough, SOB  Brief History   68 yo female, smoker (40 pack years, quit 1 week PTA when she got pneumonia), with COPD, who presented to [facility] on 10/19 with reports of 2 weeks of cough & SOB.  She was treated for PNA by her PCP.  Work up included a CTA chest which showed obstruction of the central RLL bronchus with associated postobstructive PNA & pleural effusion.  She underwent thoracentesis on 10/20 with pleural fluid consistent with metastatic adenocarcinoma. FOB with EBUS on 10/21 with pathology consistent with non-small cell carcinoma.   Past Medical History   has a past medical history of COPD (chronic obstructive pulmonary disease) ([place]), High cholesterol, and Pneumonia.   Significant Hospital Events   10/19 Admit with post-obstructive PNA 10/20 PCCM consulted 10/20 IR thora  10/21 FOB with EBUS  10/24 Tx to SDU for hypoxic resp failure, cough, SOB  Consults:  PCCM 10/20    Procedures:  Right Chest Tube 10/25 >>   Significant Diagnostic Tests:  CTA chest 10/19 >> No evidence of pulmonary embolism.  Obstruction of central right lower lobe bronchus and central low-attenuation, suspicious for centrally obstructing mass.  Near complete right lower lobe consolidation, suspicious for postobstructive pneumonitis. Moderate right pleural effusion. New bulky mediastinal and right hilar lymphadenopathy, consistent with metastatic disease.  Stable sub-cm bilateral upper lobe and right middle lobe pulmonary nodules. IR Thoracentesis 10/20 >> 700 cc  No improvement is sob or cough/ cyt pos adenocarcinoma of the lung Bronchoscopy 10/21 >> 90% obst distal BI/ with endobronchial ultrasound   Cytology R Pleural Fluid 10/20 >> metastatic adenocarcinoma  Cytology 7, FNA 10/21 >> non-small cell carcinoma  Cytology 11 R FNA 10/21 >>  non-small cell carcinoma  Cytology RLL brushing 10/21 >> non-small cell, TTF-1 positive   Micro Data:  BC x 2 10/19 >> neg  COVID 10/19 >> neg  Pleural fluid  10/20 >> neg  BAL 10/21 >> neg  Antimicrobials:  Rocephin 10/19 >> Azithro 10/19 >>  Interim history/subjective:  Pt reports improvement in cough.  Feels like she rested after chest tube & med adjustment over the weekend. Afebrile.   Objective   Blood pressure (!) 113/55, pulse 91, temperature 98 F (36.7 C), temperature source Oral, resp. rate 18, height _0  (1.702 m), weight 70.3 kg, SpO2 90 %.  12 lpm NP    FiO2 (%):  [100 %] 100 %   Intake/Output Summary (Last 24 hours) at 08/14/2019 0730 Last data filed at 08/14/2019 0600 Gross per 24 hour  Intake 378.16 ml  Output 2650 ml  Net -2271.84 ml   Filed Weights   08/07/19 1504  Weight: 70.3 kg    Examination: General: elderly female lying in bed in NAD HEENT: MM pink/moist, no jvd, anicteric, no nasal drainage  Neuro: AAOx4, speech clear, MAE, non-focal exam  CV: s1s2 rrr, no m/r/g, radial pulses +2 / symmetrical  PULM:  Even/non-labored, lungs bilaterally with ambient noise from HFNC, clear anterior, diminished R base, right chest tube in place (1.6L in [place] drain) GI: soft, bsx4 active  Extremities: warm/dry, 1+ pedal edema  Skin: no rashes or lesions  Resolved Hospital Problem list     Assessment & Plan:   Non-Small Cell Carcinoma / Adenocarcinoma RLL & Hilar mass with associated lymphadenopathy.  Right pleural effusion positive  for metastatic adenocarcinoma.  Cytology as above.   -Appreciate ONC, RAD ONC evaluation  -will plan for targeted XRT to address bronchus intermedius obstruction   Cough  Insessant dry cough to the point of gagging. Has refused neb treatments due to coughing.   -attempt nebulized lidocaine  -tussionex Q6 for cyclical cough (improved on Q6  rx)  Acute Hypoxic Respiratory Failure  In setting of COPD, right lung mass with  post obstructive PNA and effusion  -wean O2 for sats > 88% -pulmonary hygiene as able  -nebulizer's as she will allow  -solumedrol 80 mg IV Q8  -may be challenging to get to radiation with degree of O2 needs, will coordinate with RT  Malignant Right Pleural Effusion  S/p Thoracentesis on 10/20 without improvement in symptoms.  Underwent right chest tube placement on 10/25.   -follow chest tube output  -intermittent CXR  -CT to water seal -CT care per protocol    Labs   CBC: Recent Labs  Lab 08/07/19 1556 08/08/19 0523 08/09/19 0556 08/10/19 0557 08/13/19 0244  WBC 19.3* 15.7* 15.7* 16.8* 20.1*  NEUTROABS 15.1* 11.9*  --   --   --   HGB 14.6 12.9 13.0 13.2 13.4  HCT 45.4 40.1 40.5 41.8 43.9  MCV 93.2 94.4 93.1 94.8 96.3  PLT 301 274 274 263 881    Basic Metabolic Panel: Recent Labs  Lab 08/07/19 1556  08/08/19 0523 08/09/19 0556 08/10/19 0557 08/11/19 0536 08/12/19 0605 08/13/19 0244  NA 136  --  135 132* 131*  --   --  134*  K 3.6  --  3.8 3.9 4.1  --   --  4.6  CL 103  --  104 101 100  --   --  97*  CO2 21*  --  _0 --   --  25  GLUCOSE 128*  --  111* 122* 108*  --   --  170*  BUN 15  --  _1 --   --  15  CREATININE 0.97  --  0.68 0.71 0.69  --   --  0.64  CALCIUM 8.4*  --  7.7* 7.6* 7.6*  --   --  8.4*  MG  --    < > 2.2 2.1 1.9 1.9 2.0 2.2   < > = values in this interval not displayed.   GFR: Estimated Creatinine Clearance: 66.4 mL/min (by C-G formula based on SCr of 0.64 mg/dL). Recent Labs  Lab 08/07/19 1557 08/08/19 0523 08/09/19 0556 08/10/19 0557 08/13/19 0244  PROCALCITON  --   --  <0.10  --   --   WBC  --  15.7* 15.7* 16.8* 20.1*  LATICACIDVEN 1.8  --   --   --   --     Liver Function Tests: Recent Labs  Lab 08/07/19 1556 08/09/19 0556 08/10/19 0557 08/13/19 0244  AST _2 ALT 29 32 25 17  ALKPHOS 63 48 48 61  BILITOT 0.8 0.7 0.7 0.7  PROT 5.9* 5.1* 5.0* 5.7*  ALBUMIN 2.8* 2.3* 2.1* 2.4*   No results for  input(s): LIPASE, AMYLASE in the last 168 hours. No results for input(s): AMMONIA in the last 168 hours.  ABG No results found for: PHART, PCO2ART, PO2ART, HCO3, TCO2, ACIDBASEDEF, O2SAT   Coagulation Profile: Recent Labs  Lab 08/08/19 1200  INR 1.0    Cardiac Enzymes: No results for input(s): CKTOTAL, CKMB, CKMBINDEX, TROPONINI in the last 168 hours.  HbA1C: No results found for: HGBA1C  CBG: Recent Labs  Lab 08/09/19 [place], NP-C  Pulmonary & Critical Care 08/14/2019, 7:30 AM

## 2019-08-14 NOTE — Consult Note (Signed)
Radiation Oncology         [phone] ________________________________  Name: Jordan Clay        MRN: [id number]  Date of Service: 08/07/2019 DOB: November 02, 1950  QV:[id number], Jordan Bihari, MD       REFERRING PHYSICIAN: Dr. Alen Blew  DIAGNOSIS: 68 y/o female with newly diagnosed stage IV NSCLC, adenocarcinoma with obstructing RLL mass.  HISTORY OF PRESENT ILLNESS: Jordan Clay is a 68 y.o. female seen at the request of Dr. Alen Blew.  She has a longstanding history of COPD and is a current smoker.  She recently presented to the emergency department on 08/07/2019 with complaints of a nagging cough and progressive shortness of breath for the past 2 to 3 weeks despite having been treated with antibiotics for suspected pneumonia.  A chest x-ray was obtained on admission and showed progression of a right lower lobe infiltrate with probable bubble right pleural effusion and further evaluation with CT chest was recommended.  She had a CT angio chest which revealed obstruction of central right lower lobe bronchus and central low-attenuation, suspicious for centrally obstructing mass.  There was near complete right lower lobe consolidation suspicious for postobstructive pneumonitis with a moderate right pleural effusion and bulky mediastinal and right hilar lymphadenopathy, consistent with metastatic disease. She underwent thoracentesis on 08/08/2019 for both diagnostic and therapeutic purposes with 700 mL of fluid removed with fluid cytology demonstrating malignant cells consistent with metastatic adenocarcinoma.  A CT abdomen and pelvis was performed on 08/09/2019 for disease staging and this showed mild upper abdominal lymphadenopathy in the gastrohepatic and left periaortic nodes consistent with metastatic disease.  No other sites of metastatic disease were identified within the abdomen or pelvis. She underwent bronchoscopy with endobronchial ultrasound for biopsy on 08/09/2019 with Dr. Lamonte Sakai and final pathology  confirmed adenocarcinoma in the obstructing right lower lobe mass and station 7 and 11R nodes.  She has continued with shortness of breath and persistent cough which are slightly improved since the time of admission and she denies fevers, chills or hemoptysis.  We have been asked to consult for consideration of palliative radiotherapy to the obstructing right lower lobe mass.   PREVIOUS RADIATION THERAPY: No   PAST MEDICAL HISTORY:  Past Medical History:  Diagnosis Date   COPD (chronic obstructive pulmonary disease) ([place])    High cholesterol    Pneumonia        PAST SURGICAL HISTORY: Past Surgical History:  Procedure Laterality Date   BRONCHIAL BRUSHINGS  08/09/2019   Procedure: BRONCHIAL BRUSHINGS;  Surgeon: Collene Gobble, MD;  Location: [facility];  Service: Cardiopulmonary;;   BRONCHIAL WASHINGS  08/09/2019   Procedure: BRONCHIAL WASHINGS;  Surgeon: Collene Gobble, MD;  Location: [facility];  Service: Cardiopulmonary;;   ENDOBRONCHIAL ULTRASOUND Bilateral 08/09/2019   Procedure: ENDOBRONCHIAL ULTRASOUND;  Surgeon: Collene Gobble, MD;  Location: [facility];  Service: Cardiopulmonary;  Laterality: Bilateral;   FINE NEEDLE ASPIRATION BIOPSY  08/09/2019   Procedure: FINE NEEDLE ASPIRATION BIOPSY;  Surgeon: Collene Gobble, MD;  Location: [facility];  Service: Cardiopulmonary;;   VIDEO BRONCHOSCOPY N/A 08/09/2019   Procedure: VIDEO BRONCHOSCOPY WITH FLUORO;  Surgeon: Collene Gobble, MD;  Location: [facility];  Service: Cardiopulmonary;  Laterality: N/A;     FAMILY HISTORY: History reviewed. No pertinent family history.   SOCIAL HISTORY:  reports that she quit smoking about 3 weeks ago. She has never used smokeless tobacco. She reports that she does not drink alcohol or use drugs.   ALLERGIES: Ciprocin-fluocin-procin [  fluocinolone], Minocycline, Sulfa antibiotics, and Levofloxacin   MEDICATIONS:  Current Facility-Administered Medications  Medication Dose  Route Frequency Provider Last Rate Last Dose   0.45 % sodium chloride infusion   Intravenous Continuous Rigoberto Noel, MD 50 mL/hr at 08/14/19 2014     acetaminophen (TYLENOL) tablet 650 mg  650 mg Oral Q6H PRN Modena Jansky, MD   650 mg at 08/09/19 1248   Or   acetaminophen (TYLENOL) suppository 650 mg  650 mg Rectal Q6H PRN Hongalgi, Lenis Dickinson, MD       albuterol (PROVENTIL) (2.5 MG/3ML) 0.083% nebulizer solution 2.5 mg  2.5 mg Nebulization Q2H PRN Tanda Rockers, MD       Ampicillin-Sulbactam (UNASYN) 3 g in sodium chloride 0.9 % 100 mL IVPB  3 g Intravenous Q6H Pham, Anh P, RPH   Stopped at 08/14/19 1809   Chlorhexidine Gluconate Cloth 2 % PADS 6 each  6 each Topical Daily Modena Jansky, MD   6 each at 08/14/19 1728   chlorpheniramine-HYDROcodone (TUSSIONEX) 10-8 MG/5ML suspension 5 mL  5 mL Oral Q6H Tanda Rockers, MD   5 mL at 08/14/19 2025   enoxaparin (LOVENOX) injection 40 mg  40 mg Subcutaneous Q24H Vernell Leep D, MD   40 mg at 08/14/19 0948   gabapentin (NEURONTIN) capsule 100 mg  100 mg Oral TID Tanda Rockers, MD   100 mg at 08/14/19 1733   insulin aspart (novoLOG) injection 0-9 Units  0-9 Units Subcutaneous Q4H Ollis, Brandi L, NP   2 Units at 08/14/19 2025   ipratropium-albuterol (DUONEB) 0.5-2.5 (3) MG/3ML nebulizer solution 3 mL  3 mL Nebulization QID Tanda Rockers, MD   3 mL at 08/14/19 2016   lidocaine (PF) (XYLOCAINE) 1 % injection 2 mL  2 mL Other QID PRN Tanda Rockers, MD       MEDLINE mouth rinse  15 mL Mouth Rinse BID Vernell Leep D, MD   15 mL at 08/14/19 0950   methylPREDNISolone sodium succinate (SOLU-MEDROL) 125 mg/2 mL injection 80 mg  80 mg Intravenous Q8H Tanda Rockers, MD   80 mg at 08/14/19 1502   morphine 4 MG/ML injection 4 mg  4 mg Intravenous Q4H PRN Tanda Rockers, MD   4 mg at 08/13/19 2202   oxyCODONE (Oxy IR/ROXICODONE) immediate release tablet 10 mg  10 mg Oral Q6H Tanda Rockers, MD   10 mg at 08/14/19 1733    pantoprazole (PROTONIX) EC tablet 40 mg  40 mg Oral BID AC Tanda Rockers, MD   40 mg at 08/14/19 1758   polyethylene glycol (MIRALAX / GLYCOLAX) packet 17 g  17 g Oral BID Vernell Leep D, MD   17 g at 08/14/19 6222   senna-docusate (Senokot-S) tablet 2 tablet  2 tablet Oral QHS PRN Modena Jansky, MD   2 tablet at 08/11/19 2101   sodium chloride flush (NS) 0.9 % injection 3 mL  3 mL Intravenous Q12H Hongalgi, Everlene Farrier D, MD   3 mL at 08/14/19 0949   zolpidem (AMBIEN) tablet 5 mg  5 mg Oral QHS PRN Modena Jansky, MD   5 mg at 08/11/19 2231     REVIEW OF SYSTEMS: On review of systems, the patient reports that she is doing fair overall. She denies any chest pain, fevers, chills, or night sweats. She continues with shortness of breath, and dry, hacking cough but denies hemoptysis.  She reports being exhausted, having not been able  to sleep due to coughing for almost a solid 2 weeks.  Prior to this, she had been incredibly healthy.  She has had unintended weight loss recently secondary to decreased appetite and uncontrollable coughing. She denies any bowel or bladder disturbances, and denies abdominal pain, nausea or vomiting.  She denies any new musculoskeletal or joint aches or pains. A complete review of systems is obtained and is otherwise negative.  PHYSICAL EXAM:  Wt Readings from Last 3 Encounters:  08/07/19 155 lb (70.3 kg)  09/27/18 148 lb 12.8 oz (67.5 kg)   Temp Readings from Last 3 Encounters:  08/14/19 97.7 F (36.5 C) (Oral)   BP Readings from Last 3 Encounters:  08/14/19 (!) 102/52  09/27/18 118/62   Pulse Readings from Last 3 Encounters:  08/14/19 84  09/27/18 70   Pain Assessment Pain Score: 0-No pain/10  In general this is an ill  appearing Caucasian female in no acute distress.  She is alert and oriented x4 and appropriate throughout the examination. HEENT reveals that the patient is normocephalic, atraumatic. EOMs are intact. PERRLA. Skin is intact without  any evidence of gross lesions. Cardiovascular exam reveals a regular rate and rhythm, no clicks rubs or murmurs are auscultated. Chest is clear to auscultation bilaterally with severely decreased breath sounds on the right. Lymphatic assessment is performed and does not reveal any palpable adenopathy in the cervical, supraclavicular, or axillary chains. Abdomen has active bowel sounds in all quadrants and is intact. The abdomen is soft, non tender, non distended. Lower extremities are negative for pretibial pitting edema, deep calf tenderness, cyanosis or clubbing.  ECOG = 2  0 - Asymptomatic (Fully active, able to carry on all predisease activities without restriction)  1 - Symptomatic but completely ambulatory (Restricted in physically strenuous activity but ambulatory and able to carry out work of a light or sedentary nature. For example, light housework, office work)  2 - Symptomatic, <50% in bed during the day (Ambulatory and capable of all self care but unable to carry out any work activities. Up and about more than 50% of waking hours)  3 - Symptomatic, >50% in bed, but not bedbound (Capable of only limited self-care, confined to bed or chair 50% or more of waking hours)  4 - Bedbound (Completely disabled. Cannot carry on any self-care. Totally confined to bed or chair)  5 - Death   Eustace Pen MM, Creech RH, Tormey DC, et al. [phone]). "Toxicity and response criteria of the [facility]". [place] Oncol. 5 (6): 649-55    LABORATORY DATA:  Lab Results  Component Value Date   WBC 20.1 (H) 08/13/2019   HGB 13.4 08/13/2019   HCT 43.9 08/13/2019   MCV 96.3 08/13/2019   PLT 323 08/13/2019   Lab Results  Component Value Date   NA 134 (L) 08/13/2019   K 4.6 08/13/2019   CL 97 (L) 08/13/2019   CO2 25 08/13/2019   Lab Results  Component Value Date   ALT 17 08/13/2019   AST 16 08/13/2019   ALKPHOS 61 08/13/2019   BILITOT 0.7 08/13/2019      RADIOGRAPHY: Dg  Chest 1 View  Result Date: 08/08/2019 CLINICAL DATA:  Post right thoracentesis EXAM: CHEST  1 VIEW COMPARISON:  08/07/2019 FINDINGS: Heart is normal size. Consolidation in the right lower lung with right pleural effusion. No pneumothorax following thoracentesis. Left lung clear. No acute bony abnormality. Heart is normal size. IMPRESSION: Continued right lower lung airspace opacity with small  to moderate right effusion. No pneumothorax following thoracentesis. Electronically Signed   By: Rolm Baptise M.D.   On: 08/08/2019 10:26   Ct Angio Chest Pe W/cm &/or Wo Cm  Result Date: 08/07/2019 CLINICAL DATA:  Shortness of breath and hypoxia. Recent pneumonia. EXAM: CT ANGIOGRAPHY CHEST WITH CONTRAST TECHNIQUE: Multidetector CT imaging of the chest was performed using the standard protocol during bolus administration of intravenous contrast. Multiplanar CT image reconstructions and MIPs were obtained to evaluate the vascular anatomy. CONTRAST:  129mL OMNIPAQUE IOHEXOL 350 MG/ML SOLN COMPARISON:  06/10/2018 FINDINGS: Cardiovascular: Satisfactory opacification of pulmonary arteries noted, and no pulmonary emboli identified. No evidence of thoracic aortic dissection or aneurysm. Aortic atherosclerosis. Mediastinum/Nodes: New bulky lymphadenopathy is seen throughout the right paratracheal and subcarinal regions, with subcarinal soft tissue density measuring up to 4.2 cm short axis. Right hilar lymphadenopathy is also seen measuring at least 2.2 cm in short axis. Obstruction of the central right lower lobe bronchus and central low-attenuation is suspicious for centrally obstructing mass. Lungs/Pleura: There is near complete right lower lobe consolidation, suspicious for postobstructive pneumonitis. A moderate right pleural effusion is also seen. Several bilateral upper lobe and right middle lobe pulmonary nodules are seen measuring up to 5 mm which are stable. Mild emphysema again noted.33 Upper abdomen: No acute  findings. Musculoskeletal: No suspicious bone lesions identified. Review of the MIP images confirms the above findings. IMPRESSION: No evidence of pulmonary embolism. Obstruction of central right lower lobe bronchus and central low-attenuation, suspicious for centrally obstructing mass. Consider further evaluation with bronchoscopy. Near complete right lower lobe consolidation, suspicious for postobstructive pneumonitis. Moderate right pleural effusion. Consider diagnostic thoracentesis. New bulky mediastinal and right hilar lymphadenopathy, consistent with metastatic disease. Stable sub-cm bilateral upper lobe and right middle lobe pulmonary nodules. Aortic Atherosclerosis (ICD10-I70.0) and Emphysema (ICD10-J43.9). Electronically Signed   By: Marlaine Hind M.D.   On: 08/07/2019 19:23   Ct Abdomen Pelvis W Contrast  Result Date: 08/09/2019 CLINICAL DATA:  Newly diagnosed right lung carcinoma. Staging. EXAM: CT ABDOMEN AND PELVIS WITH CONTRAST TECHNIQUE: Multidetector CT imaging of the abdomen and pelvis was performed using the standard protocol following bolus administration of intravenous contrast. CONTRAST:  160mL OMNIPAQUE IOHEXOL 300 MG/ML SOLN, 74mL OMNIPAQUE IOHEXOL 300 MG/ML SOLN COMPARISON:  Chest only CTA on 08/07/2019; no prior AP CT FINDINGS: Lower Chest: Mediastinal and right hilar lymphadenopathy, bilateral pleural effusions, and right basilar consolidation, as better demonstrated on recent chest CT. Hepatobiliary: No hepatic masses identified. Pancreas:  No mass or inflammatory changes. Spleen: Within normal limits in size and appearance. Adrenals/Urinary Tract: No masses identified. No evidence of hydronephrosis. Stomach/Bowel: No evidence of obstruction, inflammatory process or abnormal fluid collections. Normal appendix visualized. Vascular/Lymphatic: Mild upper lymphadenopathy is seen in the gastrohepatic ligament, with largest lymph node measuring 1.3 cm short axis. A 10 mm retroperitoneal  lymph node is seen in the left paraaortic region. No lymphadenopathy identified within the pelvis. No abdominal aortic aneurysm. Aortic atherosclerosis. Reproductive:  No mass or other significant abnormality. Other:  None. Musculoskeletal:  No suspicious bone lesions identified. IMPRESSION: Mild upper abdominal lymphadenopathy in gastrohepatic ligament and left paraaortic region, consistent with metastatic disease. No other sites of metastatic disease identified within the abdomen or pelvis. Electronically Signed   By: Marlaine Hind M.D.   On: 08/09/2019 18:28   Dg Chest Port 1 View  Result Date: 08/14/2019 CLINICAL DATA:  Pleural effusion on the right EXAM: PORTABLE CHEST 1 VIEW COMPARISON:  Yesterday FINDINGS: Right-sided chest tube in  place. Extensive right lower lobe opacification by CT with underlying malignancy. Small or moderate pleural effusion is unchanged. Haziness of the left chest from atelectasis and pleural fluid. No pneumothorax. Stable heart size. IMPRESSION: 1. Unchanged right lower lobe opacification with overlying pleural fluid. 2. Mild atelectasis and pleural fluid at the left base. Electronically Signed   By: Monte Fantasia M.D.   On: 08/14/2019 06:41   Dg Chest Port 1 View  Result Date: 08/13/2019 CLINICAL DATA:  Right-sided chest tube placement for effusion EXAM: PORTABLE CHEST 1 VIEW COMPARISON:  Radiograph 08/12/2019 FINDINGS: Neural placement of a right chest tube with the tip positioned in the mid lung. Interval decrease in the size of the right pleural effusion with residual hazy opacity in the right lung base likely reflecting combination of atelectasis and possible re-expansion edema. Persistent hazy opacities are seen in the left lung base as well. The left costophrenic sulcus is collimated from view. No visible left effusion. No pneumothorax. Right hilar mass distorts the cardiomediastinal contour. Remaining contours are unchanged from prior. IMPRESSION: 1. Interval  decrease in the size of the right pleural effusion status post right chest tube placement. 2. Persistent hazy opacities in the right lung base, likely reflecting combination of atelectasis and possible reexpansion edema. 3. Grossly unchanged right hilar mass. Electronically Signed   By: Lovena Le M.D.   On: 08/13/2019 16:30   Dg Chest Port 1 View  Result Date: 08/12/2019 CLINICAL DATA:  Coughing, shortness of breath EXAM: PORTABLE CHEST 1 VIEW COMPARISON:  Chest radiographs, 08/08/2019, CT chest, 08/07/2019 FINDINGS: Interval increase in a right pleural effusion, now large, with associated atelectasis or consolidation. The heart and mediastinum are predominantly obscured. The left lung is normally aerated. IMPRESSION: Interval increase in a right pleural effusion, now large, with associated atelectasis or consolidation. Findings are consistent with a malignant effusion associated with a right hilar mass better appreciated by prior CT. Electronically Signed   By: Eddie Candle M.D.   On: 08/12/2019 19:20   Dg Chest Port 1 View  Result Date: 08/08/2019 CLINICAL DATA:  Post thoracentesis ongoing shortness of breath and cough. EXAM: PORTABLE CHEST 1 VIEW COMPARISON:  08/08/2019 and 08/07/2019 FINDINGS: Right hilar and mediastinal mass with right lower lobe consolidation and mass similar to prior study. Likely with persistent pleural effusion. No visible pneumothorax. No significant mediastinal shift. No signs of acute bone finding. Left chest is clear. IMPRESSION: 1. No interval change in the appearance of the chest. No pneumothorax. 2. Right hilar and mediastinal mass and right lower lobe consolidation are similar to prior study. Postobstructive pneumonia is considered. Electronically Signed   By: Zetta Bills M.D.   On: 08/08/2019 14:19   Dg Chest Port 1 View  Result Date: 08/07/2019 CLINICAL DATA:  Worsening short of breath and hypoxia.  COPD. EXAM: PORTABLE CHEST 1 VIEW COMPARISON:  08/04/2019  FINDINGS: Progression of right lower lobe infiltrate compared to the prior study. Possible right pleural effusion has developed. Left lung remains clear. Negative for heart failure or edema. Atherosclerotic aortic arch. IMPRESSION: Progression of right lower lobe infiltrate and probable right pleural effusion. Probable pneumonia. Given the progression, CT chest with contrast may be helpful for further evaluation. Electronically Signed   By: Franchot Gallo M.D.   On: 08/07/2019 15:44   US Thoracentesis Asp Pleural Space W/img Guide  Result Date: 08/08/2019 INDICATION: Shortness of breath. Newly found right sided lung mass with pleural effusion. Request for diagnostic and therapeutic thoracentesis. EXAM: ULTRASOUND GUIDED RIGHT  THORACENTESIS MEDICATIONS: None. COMPLICATIONS: None immediate. PROCEDURE: An ultrasound guided thoracentesis was thoroughly discussed with the patient and questions answered. The benefits, risks, alternatives and complications were also discussed. The patient understands and wishes to proceed with the procedure. Written consent was obtained. Ultrasound was performed to localize and mark an adequate pocket of fluid in the right chest. The area was then prepped and draped in the normal sterile fashion. 1% Lidocaine was used for local anesthesia. Under ultrasound guidance a 6 Fr Safe-T-Centesis catheter was introduced. Thoracentesis was performed. The catheter was removed and a dressing applied. FINDINGS: A total of approximately 700 mL of hazy, pale amber colored fluid was removed. Samples were sent to the laboratory as requested by the clinical team. IMPRESSION: Successful ultrasound guided right thoracentesis yielding 700 mL of pleural fluid. Read by: Ascencion Dike PA-C Electronically Signed   By: Sandi Mariscal M.D.   On: 08/08/2019 13:44       IMPRESSION/PLAN: 1. 68 y/o female with newly diagnosed stage IV NSCLC, adenocarcinoma with obstructing RLL mass. Today, after reviewing the  patient's case and imaging with Dr. Lisbeth Renshaw, I talked to the patient about the findings and workup thus far. We discussed the natural history of metastatic NSCLC and general treatment, highlighting the role of palliative radiotherapy in the management of symptomatic, obstructive disease. We discussed the available radiation techniques, and focused on the details of logistics and delivery. Dr. Lisbeth Renshaw recommends a 2 week course of daily radiotherapy to the obstructing RLL mass.  We reviewed the anticipated acute and late sequelae associated with radiation in this setting. The patient was encouraged to ask questions that were answered to her satisfaction.  At this point, the patient is not interested in treatment of curative intent as she is resistant to the idea of chemotherapy.  However, she is interested in continuing conversations with medical oncology and palliative care at this time.  At the conclusion of our conversation, the patient elects to proceed with palliative radiotherapy as recommended.  We will plan to perform CT simulation/treatment planning this afternoon in anticipation of beginning her treatment on Monday, 08/14/2019.  She understands that though we are planning to complete 10 daily treatments, this does not mean that she will absolutely have to stay in the hospital for the full course of treatment.  Once she is deemed stable for discharge by her medical hospitalist team, she can continue her daily treatments on an outpatient basis.  She appears to have a good understanding of her disease and our recommendations for treatment which are of palliative intent.  She is comfortable with and in agreement with the stated plan.  I will share our discussion with Dr. Alen Blew and move forward with treatment planning accordingly.   Nicholos Johns, MMS, PA-C [facility] [place]: [phone]   Fax: [phone]

## 2019-08-14 NOTE — Progress Notes (Signed)
IP PROGRESS NOTE  Subjective:   Events of last few days noted.  She developed worsening respiratory status and required transfer to intensive care unit.  She is currently requiring high flow oxygen to maintain oxygen saturation.  He is currently requiring 100% FiO2 with high flow 35 L nasal cannula.  He is comfortable at this time and cough appears to be stable.  Chest tube was placed yesterday and seems to be well-tolerated.  Objective:  Vital signs in last 24 hours: Temp:  [97.8 F (36.6 C)-98.4 F (36.9 C)] 98 F (36.7 C) (10/26 0700) Pulse Rate:  [80-107] 99 (10/26 1100) Resp:  [10-26] 25 (10/26 1100) BP: (95-123)/(41-66) 113/66 (10/26 1100) SpO2:  [86 %-92 %] 92 % (10/26 1100) FiO2 (%):  [100 %] 100 % (10/26 0740) Weight change:  Last BM Date: 08/06/19  Intake/Output from previous day: 10/25 0701 - 10/26 0700 In: 378.2 [I.V.:378.2] Out: 2650 [Urine:1050; Chest Tube:1600] General: Fatigued but without distress. Head: Normocephalic atraumatic. Eyes: No scleral icterus.  Pupils are equal and round reactive to light. Cardio: regular rate and rhythm, S1, S2  GI: soft, non-tender; bowel sounds normal; no masses,  no organomegaly Musculoskeletal: No joint deformity or effusion. Neurological: No motor, sensory deficits.  Intact deep tendon reflexes. Skin: No rashes or lesions.   Lab Results: Recent Labs    08/13/19 0244  WBC 20.1*  HGB 13.4  HCT 43.9  PLT 323    BMET Recent Labs    08/13/19 0244  NA 134*  K 4.6  CL 97*  CO2 25  GLUCOSE 170*  BUN 15  CREATININE 0.64  CALCIUM 8.4*    Studies/Results: Dg Chest Port 1 View  Result Date: 08/14/2019 CLINICAL DATA:  Pleural effusion on the right EXAM: PORTABLE CHEST 1 VIEW COMPARISON:  Yesterday FINDINGS: Right-sided chest tube in place. Extensive right lower lobe opacification by CT with underlying malignancy. Small or moderate pleural effusion is unchanged. Haziness of the left chest from atelectasis and pleural  fluid. No pneumothorax. Stable heart size. IMPRESSION: 1. Unchanged right lower lobe opacification with overlying pleural fluid. 2. Mild atelectasis and pleural fluid at the left base. Electronically Signed   By: Monte Fantasia M.D.   On: 08/14/2019 06:41   Dg Chest Port 1 View  Result Date: 08/13/2019 CLINICAL DATA:  Right-sided chest tube placement for effusion EXAM: PORTABLE CHEST 1 VIEW COMPARISON:  Radiograph 08/12/2019 FINDINGS: Neural placement of a right chest tube with the tip positioned in the mid lung. Interval decrease in the size of the right pleural effusion with residual hazy opacity in the right lung base likely reflecting combination of atelectasis and possible re-expansion edema. Persistent hazy opacities are seen in the left lung base as well. The left costophrenic sulcus is collimated from view. No visible left effusion. No pneumothorax. Right hilar mass distorts the cardiomediastinal contour. Remaining contours are unchanged from prior. IMPRESSION: 1. Interval decrease in the size of the right pleural effusion status post right chest tube placement. 2. Persistent hazy opacities in the right lung base, likely reflecting combination of atelectasis and possible reexpansion edema. 3. Grossly unchanged right hilar mass. Electronically Signed   By: Lovena Le M.D.   On: 08/13/2019 16:30   Dg Chest Port 1 View  Result Date: 08/12/2019 CLINICAL DATA:  Coughing, shortness of breath EXAM: PORTABLE CHEST 1 VIEW COMPARISON:  Chest radiographs, 08/08/2019, CT chest, 08/07/2019 FINDINGS: Interval increase in a right pleural effusion, now large, with associated atelectasis or consolidation. The heart and  mediastinum are predominantly obscured. The left lung is normally aerated. IMPRESSION: Interval increase in a right pleural effusion, now large, with associated atelectasis or consolidation. Findings are consistent with a malignant effusion associated with a right hilar mass better appreciated by  prior CT. Electronically Signed   By: Eddie Candle M.D.   On: 08/12/2019 19:20    Medications: I have reviewed the patient's current medications.  Assessment/Plan:  68 year old with:  1.  Stage IV lung cancer with adenocarcinoma confirmed by bronchoscopy and biopsy.  Molecular testing is currently pending.  The natural course of this disease has been reviewed multiple times and was reiterated with the patient and her family.  Any treatment is palliative at this time.  Given her debilitated status and current performance status, systemic chemotherapy usually does not offer the appropriate palliation given the fact that we are dealing with adenocarcinoma of the primary histology.  You do not expect a rapid improvement in her clinical status with systemic chemotherapy at this time making her prognosis overall poor.   2.  Respiratory failure: due to obstruction from her primary tumor as well as pleural effusion.  Radiation therapy might help improve her respiratory status although it is unclear at this time whether she will be able to handle treatment given her tenuous respiratory status.  3.  Prognosis: She has an incurable malignancy and overall prognosis is poor.  She is quite debilitated with severe respiratory compromise and the likelihood of rapid recovery with systemic therapy is small.  I agree with palliative medicine evaluation and will continue to follow and provide additional input from oncology standpoint.  25  minutes was spent with the patient face-to-face today.  More than 50% of time was spent on updating her disease status, reviewing imaging studies as well as discussing future plan of care.     LOS: 7 days   Zola Button 08/14/2019, 11:07 AM

## 2019-08-15 ENCOUNTER — Inpatient Hospital Stay (HOSPITAL_COMMUNITY): Payer: Medicare Other

## 2019-08-15 ENCOUNTER — Ambulatory Visit
Admit: 2019-08-15 | Discharge: 2019-08-15 | Disposition: A | Discharge status: Home / Self Care | Payer: Medicare Other | Attending: Radiation Oncology | Admitting: Radiation Oncology

## 2019-08-15 DIAGNOSIS — J9601 Acute respiratory failure with hypoxia: Secondary | ICD-10-CM | POA: Diagnosis not present

## 2019-08-15 DIAGNOSIS — Z515 Encounter for palliative care: Secondary | ICD-10-CM

## 2019-08-15 DIAGNOSIS — J189 Pneumonia, unspecified organism: Secondary | ICD-10-CM | POA: Diagnosis not present

## 2019-08-15 DIAGNOSIS — Z7189 Other specified counseling: Secondary | ICD-10-CM

## 2019-08-15 DIAGNOSIS — R918 Other nonspecific abnormal finding of lung field: Secondary | ICD-10-CM | POA: Diagnosis not present

## 2019-08-15 DIAGNOSIS — J90 Pleural effusion, not elsewhere classified: Secondary | ICD-10-CM | POA: Diagnosis not present

## 2019-08-15 LAB — CBC
HCT: 38.4 % (ref 36.0–46.0)
Hemoglobin: 12.1 g/dL (ref 12.0–15.0)
MCH: 30.2 pg (ref 26.0–34.0)
MCHC: 31.5 g/dL (ref 30.0–36.0)
MCV: 95.8 fL (ref 80.0–100.0)
Platelets: 332 10*3/uL (ref 150–400)
RBC: 4.01 MIL/uL (ref 3.87–5.11)
RDW: 12.1 % (ref 11.5–15.5)
WBC: 28.5 10*3/uL — ABNORMAL HIGH (ref 4.0–10.5)
nRBC: 0 % (ref 0.0–0.2)

## 2019-08-15 LAB — GLUCOSE, CAPILLARY
Glucose-Capillary: 176 mg/dL — ABNORMAL HIGH (ref 70–99)
Glucose-Capillary: 171 mg/dL — ABNORMAL HIGH (ref 70–99)
Glucose-Capillary: 160 mg/dL — ABNORMAL HIGH (ref 70–99)
Glucose-Capillary: 177 mg/dL — ABNORMAL HIGH (ref 70–99)
Glucose-Capillary: 149 mg/dL — ABNORMAL HIGH (ref 70–99)
Glucose-Capillary: 124 mg/dL — ABNORMAL HIGH (ref 70–99)

## 2019-08-15 LAB — BASIC METABOLIC PANEL
Anion gap: 10 (ref 5–15)
BUN: 21 mg/dL (ref 8–23)
CO2: 30 mmol/L (ref 22–32)
Calcium: 8.2 mg/dL — ABNORMAL LOW (ref 8.9–10.3)
Chloride: 98 mmol/L (ref 98–111)
Creatinine, Ser: 0.55 mg/dL (ref 0.44–1.00)
GFR calc Af Amer: >60 mL/min (ref >60)
GFR calc non Af Amer: >60 mL/min (ref >60)
Glucose, Bld: 180 mg/dL — ABNORMAL HIGH (ref 70–99)
Potassium: 4.5 mmol/L (ref 3.5–5.1)
Sodium: 138 mmol/L (ref 135–145)

## 2019-08-15 MED ORDER — MORPHINE SULFATE (PF) 2 MG/ML IV SOLN
2.0000 mg | INTRAVENOUS | Status: DC | PRN
  Administered 2019-08-16: 2 mg via INTRAVENOUS
  Administered 2019-08-19 – 2019-08-28 (×2): 4 mg via INTRAVENOUS
  Filled 2019-08-15: qty 2
  Filled 2019-08-15 (×2): qty 1
  Filled 2019-08-15: qty 2

## 2019-08-15 MED ORDER — BISACODYL 5 MG PO TBEC: 10.0000 mg | DELAYED_RELEASE_TABLET | Freq: Every day | ORAL | Status: DC | PRN

## 2019-08-15 MED ORDER — METHYLPREDNISOLONE SODIUM SUCC 40 MG IJ SOLR
40.0000 mg | Freq: Three times a day (TID) | INTRAMUSCULAR | Status: DC
  Administered 2019-08-15 – 2019-08-16 (×3): 40 mg via INTRAVENOUS
  Filled 2019-08-15 (×3): qty 1

## 2019-08-15 NOTE — Consult Note (Signed)
Consultation Note Date: 08/15/2019   Patient Name: Jordan Clay  DOB: 03-Jan-1951  MRN: [id number]  Age / Sex: 68 y.o., female  PCP: Hulan Fess, MD Referring Physician: Rigoberto Noel, MD  Reason for Consultation: Establishing goals of care  HPI/Patient Profile: 68 y.o. female  admitted on 08/07/2019    Clinical Assessment and Goals of Care:  68 yo lady who lives at home with her husband. She has a history of smoking, she recently had PNA. Recent CT chest showed obstruction of central RLL bronchus with associated postobstructive PNA and pleural effusion. Cytology suggestive of metastatic adenocarcinoma. She has been seen by medical oncology and radiation oncology, currently on palliative radiation. She remains admitted to stepdown unit, with high O2 requirements.   PMT consult for additional support and ongoing goals of care conversations.   Ms Digilio is resting in bed, she feels constipated, last bowel movement was week and a half ago. She denies acute symptoms of dyspnea or pain. She is asking what time her radiation is scheduled for today. At times, she has bouts of cough. She is awake alert oriented.   Palliative medicine is specialized medical care for people living with serious illness. It focuses on providing relief from the symptoms and stress of a serious illness. The goal is to improve quality of life for both the patient and the family.  Goals of care: Broad aims of medical therapy in relation to the patient's values and preferences. Our aim is to provide medical care aimed at enabling patients to achieve the goals that matter most to them, given the circumstances of their particular medical situation and their constraints.   Patient states that she hasn't been hospitalized in her life for anything. She was feeling ok just 3 weeks ago. Offered active listening and supportive care. Allowed for the  patient to express her feelings and emotions, in the context of this serious illness diagnosis.   Also met with the patient's son outside the room. Again, reviewed the above. He is thankful for information he is receiving from hospital staff. He states that his family is trying to remain hopeful, but are aware that the patient is seriously ill currently. See below.  NEXT OF KIN  husband and children.   SUMMARY OF RECOMMENDATIONS    Agree with DNR. Discussed with patient as well as son separately. Goals are not comfort-focused at this time. Patient and family desire any and all available reasonable medical interventions, keeping DNR in effect.  Continues with radiation treatments, patient/family aware of the palliative nature of treatment.  Continue current pain and non pain symptom management measures.  Monitor O2 requirements, functional status, hospital course and overall disease trajectory to help guide further decision making.  Thank you for the consult.   Code Status/Advance Care Planning:  DNR    Symptom Management:       Palliative Prophylaxis:   Delirium Protocol  Psycho-social/Spiritual:   Desire for further Chaplaincy support:yes  Additional Recommendations: Education on Hospice  Prognosis:   Unable  to determine  Discharge Planning: To Be Determined      Primary Diagnoses: Present on Admission: . Postobstructive pneumonia . COPD (chronic obstructive pulmonary disease) ([place]) . Pleural effusion on right . Prolonged QT interval . Mass of right lung . Acute respiratory failure with hypoxemia ([place])   I have reviewed the medical record, interviewed the patient and family, and examined the patient. The following aspects are pertinent.  Past Medical History:  Diagnosis Date  . COPD (chronic obstructive pulmonary disease) ([place])   . High cholesterol   . Pneumonia    Social History   Socioeconomic History  . Marital status: Married    Spouse name: Not on  file  . Number of children: Not on file  . Years of education: Not on file  . Highest education level: Not on file  Occupational History  . Not on file  Social Needs  . Financial resource strain: Not on file  . Food insecurity    Worry: Not on file    Inability: Not on file  . Transportation needs    Medical: Not on file    Non-medical: Not on file  Tobacco Use  . Smoking status: Former Smoker    Quit date: 07/20/2019    Years since quitting: 0.0  . Smokeless tobacco: Never Used  Substance and Sexual Activity  . Alcohol use: Never    Frequency: Never  . Drug use: Never  . Sexual activity: Not on file  Lifestyle  . Physical activity    Days per week: Not on file    Minutes per session: Not on file  . Stress: Not on file  Relationships  . Social Herbalist on phone: Not on file    Gets together: Not on file    Attends religious service: Not on file    Active member of club or organization: Not on file    Attends meetings of clubs or organizations: Not on file    Relationship status: Not on file  Other Topics Concern  . Not on file  Social History Narrative  . Not on file   History reviewed. No pertinent family history. Scheduled Meds: . Chlorhexidine Gluconate Cloth  6 each Topical Daily  . chlorpheniramine-HYDROcodone  5 mL Oral Q6H  . enoxaparin (LOVENOX) injection  40 mg Subcutaneous Q24H  . gabapentin  100 mg Oral TID  . insulin aspart  0-9 Units Subcutaneous Q4H  . ipratropium-albuterol  3 mL Nebulization QID  . mouth rinse  15 mL Mouth Rinse BID  . methylPREDNISolone (SOLU-MEDROL) injection  40 mg Intravenous Q8H  . oxyCODONE  10 mg Oral Q6H  . pantoprazole  40 mg Oral BID AC  . polyethylene glycol  17 g Oral BID  . sodium chloride flush  3 mL Intravenous Q12H   Continuous Infusions: . sodium chloride 50 mL/hr at 08/15/19 0800  . ampicillin-sulbactam (UNASYN) IV Stopped (08/15/19 1153)   PRN Meds:.acetaminophen **OR** acetaminophen, albuterol,  bisacodyl, lidocaine (PF), morphine injection, senna-docusate, zolpidem Medications Prior to Admission:  Prior to Admission medications   Medication Sig Start Date End Date Taking? Authorizing Provider  albuterol (VENTOLIN HFA) 108 (90 Base) MCG/ACT inhaler Inhale 2 puffs into the lungs every 4 (four) hours as needed for shortness of breath. 07/31/19  Yes [provider]  atorvastatin (LIPITOR) 20 MG tablet Take 20 mg by mouth daily. 09/03/18  Yes [provider]  benzonatate (TESSALON) 200 MG capsule Take 200 mg by mouth 3 (three) times  daily. 07/31/19  Yes [provider]  cefdinir (OMNICEF) 300 MG capsule Take 300 mg by mouth 2 (two) times daily. 08/06/19  Yes [provider]   Allergies  Allergen Reactions  . Ciprocin-Fluocin-Procin [Fluocinolone]   . Minocycline Cough  . Sulfa Antibiotics Cough  . Levofloxacin Nausea And Vomiting and Anxiety   Review of Systems +cough Ongoing hypoxia  Physical Exam Decreased breath sounds She has chest tube Regular S1 S2 Abdomen not distended No edema Non focal  Vital Signs: BP 129/71   Pulse (!) 126   Temp (!) 97.5 F (36.4 C) (Oral)   Resp (!) 22   Ht 5' 7"  (1.702 m)   Wt 70.3 kg   SpO2 96%   BMI 24.28 kg/m  Pain Scale: 0-10   Pain Score: 0-No pain   SpO2: SpO2: 96 % O2 Device:SpO2: 96 % O2 Flow Rate: .O2 Flow Rate (L/min): 25 L/min  IO: Intake/output summary:   Intake/Output Summary (Last 24 hours) at 08/15/2019 1416 Last data filed at 08/15/2019 0800 Gross per 24 hour  Intake 1485.92 ml  Output 1275 ml  Net 210.92 ml    LBM: Last BM Date: 08/06/19 Baseline Weight: Weight: 70.3 kg Most recent weight: Weight: 70.3 kg     Palliative Assessment/Data:   PPS 40  Time In:  09:00 Time Out:  10:00 Time Total:  60 Greater than 50%  of this time was spent counseling and coordinating care related to the above assessment and plan.  Signed by: Loistine Chance, MD  [phone] Please  contact Palliative Medicine Team phone at [phone] for questions and concerns.  For individual provider: See Shea Evans

## 2019-08-15 NOTE — Progress Notes (Signed)
[facility] Therapy Treatment Record Phone [phone]   Radiation Therapy was administered to Jordan Clay on: 08/15/2019  12:42 PM and was treatment # 2 out of a planned course of 10 treatments.  Radiation Treatment  1). Beam photons with 6-10 energy  2). Brachytherapy None  3). Stereotactic Radiosurgery None  4). Other Radiation None     Anyelina Claycomb J Haylen Bellotti, RT

## 2019-08-15 NOTE — Progress Notes (Signed)
NAME:  Jordan Clay, MRN:  [id number], DOB:  1951/08/02, LOS: 47 ADMISSION DATE:  08/07/2019, CONSULTATION DATE:  10/20 REFERRING MD:  Reesa Chew (THR) , CHIEF COMPLAINT:  Cough, SOB  Brief History   68 yo female, smoker (40 pack years, quit 1 week PTA when she got pneumonia), with COPD, who presented to [facility] on 10/19 with reports of 2 weeks of cough & SOB.  She was treated for PNA by her PCP.  Work up included a CTA chest which showed obstruction of the central RLL bronchus with associated postobstructive PNA & pleural effusion.  She underwent thoracentesis on 10/20 with pleural fluid consistent with metastatic adenocarcinoma. FOB with EBUS on 10/21 with pathology consistent with non-small cell carcinoma.   Past Medical History   has a past medical history of COPD (chronic obstructive pulmonary disease) ([place]), High cholesterol, and Pneumonia.   Significant Hospital Events   10/19 Admit with post-obstructive PNA 10/20 PCCM consulted 10/20 IR thora  10/21 FOB with EBUS  10/24 Tx to SDU for hypoxic resp failure, cough, SOB  Consults:  PCCM 10/20    Procedures:  Right Chest Tube 10/25 >>   Significant Diagnostic Tests:  CTA chest 10/19 >> No evidence of pulmonary embolism.  Obstruction of central right lower lobe bronchus and central low-attenuation, suspicious for centrally obstructing mass.  Near complete right lower lobe consolidation, suspicious for postobstructive pneumonitis. Moderate right pleural effusion. New bulky mediastinal and right hilar lymphadenopathy, consistent with metastatic disease.  Stable sub-cm bilateral upper lobe and right middle lobe pulmonary nodules. IR Thoracentesis 10/20 >> 700 cc  No improvement is sob or cough/ cyt pos adenocarcinoma of the lung Bronchoscopy 10/21 >> 90% obst distal BI/ with endobronchial ultrasound   Cytology R Pleural Fluid 10/20 >> metastatic adenocarcinoma  Cytology 7, FNA 10/21 >> non-small cell carcinoma  Cytology 11 R FNA 10/21 >>  non-small cell carcinoma  Cytology RLL brushing 10/21 >> non-small cell, TTF-1 positive   Micro Data:  BC x 2 10/19 >> neg  COVID 10/19 >> neg  Pleural fluid  10/20 >> neg  BAL 10/21 >> neg  Antimicrobials:  Rocephin 10/19 >> Azithro 10/19 >>  Interim history/subjective:  Code status reportedly rversed by husband overnight per notes.  Afebrile.  On 25L HFNC, 100% O2. Pt reports chest tube is leaking, dressing had to be changed overnight. 300 ml in [place].   Objective   Blood pressure (!) 108/49, pulse 84, temperature 98 F (36.7 C), temperature source Oral, resp. rate 11, height _0  (1.702 m), weight 70.3 kg, SpO2 (!) 70 %.  12 lpm NP    FiO2 (%):  [100 %] 100 %   Intake/Output Summary (Last 24 hours) at 08/15/2019 0800 Last data filed at 08/15/2019 0600 Gross per 24 hour  Intake 2496.64 ml  Output 1275 ml  Net 1221.64 ml   Filed Weights   08/07/19 1504  Weight: 70.3 kg    Examination: General: thin adult female lying in bed in NAD HEENT: MM pink/moist, no jvd, [place] O2 Neuro: Awake, alert, oriented, speech clear, MAE, normal strength  CV: s1s2 rrr, no m/r/g PULM:  Non-labored at rest, dyspnea with exertion, clear anterior, diminished right base, right chest tube in place without air leak GI: soft, bsx4 active  Extremities: warm/dry, trace pedal edema  Skin: no rashes or lesions  Resolved Hospital Problem list     Assessment & Plan:   Non-Small Cell Carcinoma / Adenocarcinoma RLL & Hilar mass with associated lymphadenopathy.  Right pleural effusion positive for metastatic adenocarcinoma.  Cytology as above.  Radiation initiated 10/26.  -Appreciate ONC, RAD ONC involvement  -continue targeted XRT to address bronchus intermedius obstruction   Cough  Insessant dry cough to the point of gagging. Has refused neb treatments due to coughing.   -nebulized lidocaine as patient will allow  -tussionex Q6 for cyclical cough   Acute Hypoxic Respiratory Failure  In  setting of COPD, right lung mass with post obstructive PNA and effusion  -wean O2 for sats > 88% -pulmonary hygiene as able  -solumedrol 83m IV Q8  -appreciate RT for coordination of care for transport, high O2 needs  Malignant Right Pleural Effusion  S/p Thoracentesis on 10/20 without improvement in symptoms.  Underwent right chest tube placement on 10/25.   -follow chest tube drainage, 300 ml since chamber changed 10/26 pm.  Hopeful with XRT that drainage will slow -follow intermittent CXR  -CT to water seal  -CT care per protocol, dressing changes to keep site dry  Steroid Induced Hyperglycemia  -SSI, sensitive scale    Labs   CBC: Recent Labs  Lab 08/09/19 0556 08/10/19 0557 08/13/19 0244 08/15/19 0151  WBC 15.7* 16.8* 20.1* 28.5*  HGB 13.0 13.2 13.4 12.1  HCT 40.5 41.8 43.9 38.4  MCV 93.1 94.8 96.3 95.8  PLT 274 263 323 3924   Basic Metabolic Panel: Recent Labs  Lab 08/09/19 0556 08/10/19 0557 08/11/19 0536 08/12/19 0605 08/13/19 0244 08/15/19 0151  NA 132* 131*  --   --  134* 138  K 3.9 4.1  --   --  4.6 4.5  CL 101 100  --   --  97* 98  CO2 22 22  --   --  25 30  GLUCOSE 122* 108*  --   --  170* 180*  BUN 10 9  --   --  15 21  CREATININE 0.71 0.69  --   --  0.64 0.55  CALCIUM 7.6* 7.6*  --   --  8.4* 8.2*  MG 2.1 1.9 1.9 2.0 2.2  --    GFR: Estimated Creatinine Clearance: 66.4 mL/min (by C-G formula based on SCr of 0.55 mg/dL). Recent Labs  Lab 08/09/19 0556 08/10/19 0557 08/13/19 0244 08/15/19 0151  PROCALCITON <0.10  --   --   --   WBC 15.7* 16.8* 20.1* 28.5*    Liver Function Tests: Recent Labs  Lab 08/09/19 0556 08/10/19 0557 08/13/19 0244  AST _0 ALT 32 25 17  ALKPHOS 48 48 61  BILITOT 0.7 0.7 0.7  PROT 5.1* 5.0* 5.7*  ALBUMIN 2.3* 2.1* 2.4*   No results for input(s): LIPASE, AMYLASE in the last 168 hours. No results for input(s): AMMONIA in the last 168 hours.  ABG No results found for: PHART, PCO2ART, PO2ART, HCO3,  TCO2, ACIDBASEDEF, O2SAT   Coagulation Profile: Recent Labs  Lab 08/08/19 1200  INR 1.0    Cardiac Enzymes: No results for input(s): CKTOTAL, CKMB, CKMBINDEX, TROPONINI in the last 168 hours.  HbA1C: Hgb A1c MFr Bld  Date/Time Value Ref Range Status  08/13/2019 02:44 AM 5.9 (H) 4.8 - 5.6 % Final    Comment:    (NOTE) Pre diabetes:          5.7%-6.4% Diabetes:              >6.4% Glycemic control for   <7.0% adults with diabetes     CBG: Recent Labs  Lab 08/14/19 1224 08/14/19 1646  08/14/19 2020 08/14/19 2338 08/15/19 [place], NP-C  Pulmonary & Critical Care 08/15/2019, 8:00 AM

## 2019-08-15 NOTE — Progress Notes (Signed)
Events noted.  She reports no major changes in her symptoms although she does report feeling more comfortable with less cough although at the time her cough remains an issue.  She has tolerated transport to radiation the last 2 days.  Disease status was updated again and I agree would be current approach of continued level of care and DNR status.  She understands that at her current situation she cannot receive any additional anticancer treatment.  Systemic chemotherapy will be rather toxic and will not alter her disease trajectory at this time.  However, she continues to improve and her tumor harbors a targetable mutation then anticancer treatment might be an option for her down the line.  She also understands of her condition continues to deteriorate, comfort care will be her best option.   We will continue to follow and provide any additional updates from an oncology standpoint.

## 2019-08-16 ENCOUNTER — Ambulatory Visit
Admit: 2019-08-16 | Discharge: 2019-08-16 | Disposition: A | Discharge status: Home / Self Care | Payer: Medicare Other | Attending: Radiation Oncology | Admitting: Radiation Oncology

## 2019-08-16 ENCOUNTER — Inpatient Hospital Stay (HOSPITAL_COMMUNITY): Payer: Medicare Other

## 2019-08-16 DIAGNOSIS — J9601 Acute respiratory failure with hypoxia: Secondary | ICD-10-CM | POA: Diagnosis not present

## 2019-08-16 DIAGNOSIS — R918 Other nonspecific abnormal finding of lung field: Secondary | ICD-10-CM | POA: Diagnosis not present

## 2019-08-16 LAB — GLUCOSE, CAPILLARY
Glucose-Capillary: 109 mg/dL — ABNORMAL HIGH (ref 70–99)
Glucose-Capillary: 116 mg/dL — ABNORMAL HIGH (ref 70–99)
Glucose-Capillary: 117 mg/dL — ABNORMAL HIGH (ref 70–99)
Glucose-Capillary: 127 mg/dL — ABNORMAL HIGH (ref 70–99)
Glucose-Capillary: 181 mg/dL — ABNORMAL HIGH (ref 70–99)
Glucose-Capillary: 96 mg/dL (ref 70–99)

## 2019-08-16 MED ORDER — METHYLPREDNISOLONE SODIUM SUCC 40 MG IJ SOLR
40.0000 mg | INTRAMUSCULAR | Status: DC
  Administered 2019-08-17 – 2019-08-26 (×9): 40 mg via INTRAVENOUS
  Filled 2019-08-16 (×11): qty 1

## 2019-08-16 MED ORDER — CLONAZEPAM 0.5 MG PO TABS
0.2500 mg | ORAL_TABLET | Freq: Two times a day (BID) | ORAL | Status: DC
  Administered 2019-08-16: 0.25 mg via ORAL
  Filled 2019-08-16: qty 1

## 2019-08-16 MED ORDER — CLONAZEPAM 0.5 MG PO TABS
0.5000 mg | ORAL_TABLET | Freq: Two times a day (BID) | ORAL | Status: DC
  Administered 2019-08-16 – 2019-08-17 (×3): 0.5 mg via ORAL
  Filled 2019-08-16 (×3): qty 1

## 2019-08-16 MED ORDER — ENOXAPARIN SODIUM 40 MG/0.4ML ~~LOC~~ SOLN
40.0000 mg | SUBCUTANEOUS | Status: DC
  Administered 2019-08-16 – 2019-08-28 (×11): 40 mg via SUBCUTANEOUS
  Filled 2019-08-16 (×11): qty 0.4

## 2019-08-16 NOTE — Progress Notes (Addendum)
NAME:  Jordan Clay, MRN:  [id number], DOB:  1951/02/10, LOS: 34 ADMISSION DATE:  08/07/2019, CONSULTATION DATE:  10/20 REFERRING MD:  Reesa Chew (THR) , CHIEF COMPLAINT:  Cough, SOB  Brief History   68 yo female, smoker (40 pack years, quit 1 week PTA when she got pneumonia), with COPD, who presented to [facility] on 10/19 with reports of 2 weeks of cough & SOB.  She was treated for PNA by her PCP.  Work up included a CTA chest which showed obstruction of the central RLL bronchus with associated postobstructive PNA & pleural effusion.  She underwent thoracentesis on 10/20 with pleural fluid consistent with metastatic adenocarcinoma. FOB with EBUS on 10/21 with pathology consistent with non-small cell carcinoma.   Past Medical History   has a past medical history of COPD (chronic obstructive pulmonary disease) ([place]), High cholesterol, and Pneumonia.   Significant Hospital Events   10/19 Admit with post-obstructive PNA 10/20 PCCM consulted 10/20 IR thora  10/21 FOB with EBUS  10/24 Tx to SDU for hypoxic resp failure, cough, SOB  Consults:  PCCM 10/20    Procedures:  Right Chest Tube 10/25 >>   Significant Diagnostic Tests:  CTA chest 10/19 >> No evidence of pulmonary embolism.  Obstruction of central right lower lobe bronchus and central low-attenuation, suspicious for centrally obstructing mass.  Near complete right lower lobe consolidation, suspicious for postobstructive pneumonitis. Moderate right pleural effusion. New bulky mediastinal and right hilar lymphadenopathy, consistent with metastatic disease.  Stable sub-cm bilateral upper lobe and right middle lobe pulmonary nodules. IR Thoracentesis 10/20 >> 700 cc  No improvement is sob or cough/ cyt pos adenocarcinoma of the lung Bronchoscopy 10/21 >> 90% obst distal BI/ with endobronchial ultrasound   Cytology R Pleural Fluid 10/20 >> metastatic adenocarcinoma  Cytology 7, FNA 10/21 >> non-small cell carcinoma  Cytology 11 R FNA 10/21 >>  non-small cell carcinoma  Cytology RLL brushing 10/21 >> non-small cell, TTF-1 positive   Micro Data:  BC x 2 10/19 >> neg  COVID 10/19 >> neg  Pleural fluid  10/20 >> neg  BAL 10/21 >> neg  Antimicrobials:  Rocephin 10/19 >> Azithro 10/19 >>  Interim history/subjective:  Remains on HFNC, very anxious.    Objective   Blood pressure 122/63, pulse 83, temperature 97.8 F (36.6 C), temperature source Oral, resp. rate 14, height _0  (1.702 m), weight 70.3 kg, SpO2 96 %.      FiO2 (%):  [70 %-100 %] 80 %   Intake/Output Summary (Last 24 hours) at 08/16/2019 0740 Last data filed at 08/16/2019 0542 Gross per 24 hour  Intake 2067.16 ml  Output 1470 ml  Net 597.16 ml   Filed Weights   08/07/19 1504  Weight: 70.3 kg    Examination: GEN: cachetic woman very tired HEENT: HFNC in place, MM dry CV: RRR, +SEM, ext warm PULM: diminished on R, left clear, tachypneic and shallow respiratory efforts GI: Soft, +BS EXT: No edema, +muscle wasting NEURO: moves all  4 ext PSYCH: RASS -1, very anxious SKIN: No rashes, R chest tube in place with serous output, no AL Bedside US:R  no fluid, + atelectasis and mass  CXR: residual PTX, c/w entrapment  Resolved Hospital Problem list     Assessment & Plan:  # Acute hypoxemic respiratory failure secondary to metastatic lung adenocarcinoma, severe deconditioning, emphysema, malignant right effusion and relative protein calorie malnutrition  The mass itself takes up 1/3 of her right lung and encases RML and RLL  with pleural spread and entrapment, She essentially is breathing with her noncompliant RUL and left lungs which also show advanced emphysematous changes.  All blood going through mass/atelectasis is shunted/ not oxygenated.  She has received a couple rounds of palliative radiation.  Remains overall frail and not improving much, do not think she will unless there is a massive debulking from the radiation which is unlikely.  Do not see  obvious targets for meaningful endobronchial intervention.  Not sure what else we can do for her other than continue to address goals of care.   Appreciate palliative continuing to follow.   5 days unasyn fine, doubt any infectious component here.  Since she did not benefit symptomatically from the chest tube or thoracentesis I do not really think a pleurX is indicated.  Can do clamping trial sometime down the line if she has any improvement.  I am going to add some clonazepam and drop her steroids to see if we can get her anxiety under a little better control.  31 mins CC time Erskine Emery MD PCCM

## 2019-08-17 ENCOUNTER — Other Ambulatory Visit: Payer: Self-pay | Admitting: *Deleted

## 2019-08-17 ENCOUNTER — Ambulatory Visit
Admit: 2019-08-17 | Discharge: 2019-08-17 | Disposition: A | Discharge status: Home / Self Care | Payer: Medicare Other | Attending: Radiation Oncology | Admitting: Radiation Oncology

## 2019-08-17 DIAGNOSIS — R918 Other nonspecific abnormal finding of lung field: Secondary | ICD-10-CM

## 2019-08-17 DIAGNOSIS — F418 Other specified anxiety disorders: Secondary | ICD-10-CM

## 2019-08-17 LAB — GLUCOSE, CAPILLARY
Glucose-Capillary: 108 mg/dL — ABNORMAL HIGH (ref 70–99)
Glucose-Capillary: 118 mg/dL — ABNORMAL HIGH (ref 70–99)
Glucose-Capillary: 119 mg/dL — ABNORMAL HIGH (ref 70–99)
Glucose-Capillary: 134 mg/dL — ABNORMAL HIGH (ref 70–99)
Glucose-Capillary: 89 mg/dL (ref 70–99)
Glucose-Capillary: 98 mg/dL (ref 70–99)

## 2019-08-17 MED ORDER — PHENOL 1.4 % MT LIQD
1.0000 | OROMUCOSAL | Status: DC | PRN
  Filled 2019-08-17 (×2): qty 177

## 2019-08-17 MED ORDER — MENTHOL 3 MG MT LOZG
1.0000 | LOZENGE | OROMUCOSAL | Status: DC | PRN
  Filled 2019-08-17 (×2): qty 9

## 2019-08-17 MED ORDER — MENTHOL 3 MG MT LOZG
1.0000 | LOZENGE | OROMUCOSAL | Status: DC | PRN
  Administered 2019-08-24: 3 mg via ORAL

## 2019-08-17 MED ORDER — CEPASTAT 14.5 MG MT LOZG
1.0000 | LOZENGE | OROMUCOSAL | Status: DC | PRN
  Filled 2019-08-17: qty 9

## 2019-08-17 MED ORDER — LIP MEDEX EX OINT
TOPICAL_OINTMENT | CUTANEOUS | Status: AC
  Administered 2019-08-17: 1
  Filled 2019-08-17: qty 7

## 2019-08-17 NOTE — Progress Notes (Signed)
Per Edd Fabian (DD) and Junie Panning (AD), patient is allowed two visitors which will be son and husband.

## 2019-08-17 NOTE — Progress Notes (Signed)
[facility] Therapy Treatment Record Phone [phone]   Radiation Therapy was administered to Myriam Forehand on: 08/17/2019  12:18 PM and was treatment # 4 out of a planned course of 10 treatments.  Radiation Treatment  1). Beam photons with 6-10 energy  2). Brachytherapy None  3). Stereotactic Radiosurgery None  4). Other Radiation None     Alianys Chacko F Tavien Chestnut, RT (T)

## 2019-08-17 NOTE — Progress Notes (Signed)
The proposed treatment discussed in cancer conference 08/17/19 is for discussion purpose only and is not a binding recommendation.  The patient was not physically examined nor present for their treatment options.  Therefore, final treatment plans cannot be decided.

## 2019-08-17 NOTE — Progress Notes (Signed)
Daily Progress Note   Patient Name: Jordan Clay       Date: 08/17/2019 DOB: Oct 18, 1951  Age: 68 y.o. MRN#: [id number] Attending Physician: Candee Furbish, MD Primary Care Physician: Hulan Fess, MD Admit Date: 08/07/2019  Reason for Consultation/Follow-up: Establishing goals of care  Subjective: Patient is awake alert resting in bed. +coughing spells. Feels like her breathing is improving and that her strength is improving too. No family at bedside. Tolerating radiation attempts. She is thankful for the care she is receiving. Able to tolerate more liquids than solid foods. Trying to increase her PO.      Length of Stay: 10  Current Medications: Scheduled Meds:  . Chlorhexidine Gluconate Cloth  6 each Topical Daily  . chlorpheniramine-HYDROcodone  5 mL Oral Q6H  . clonazePAM  0.5 mg Oral BID  . enoxaparin (LOVENOX) injection  40 mg Subcutaneous Q24H  . gabapentin  100 mg Oral TID  . insulin aspart  0-9 Units Subcutaneous Q4H  . ipratropium-albuterol  3 mL Nebulization QID  . mouth rinse  15 mL Mouth Rinse BID  . methylPREDNISolone (SOLU-MEDROL) injection  40 mg Intravenous Q24H  . oxyCODONE  10 mg Oral Q6H  . pantoprazole  40 mg Oral BID AC  . polyethylene glycol  17 g Oral BID  . sodium chloride flush  3 mL Intravenous Q12H    Continuous Infusions: . sodium chloride 50 mL/hr at 08/16/19 1912  . ampicillin-sulbactam (UNASYN) IV Stopped (08/17/19 0541)    PRN Meds: acetaminophen **OR** acetaminophen, albuterol, bisacodyl, lidocaine (PF), morphine injection, phenol, phenol-menthol, senna-docusate, zolpidem  Physical Exam         Awake alert Diminished breath sounds R Less anxious Has chest tube Abdomen is not tender No edema Has muscle wasting On 20L and 55%,  still with high O2 requirements.  Vital Signs: BP (!) 104/58   Pulse (!) 102   Temp 98.2 F (36.8 C) (Oral)   Resp (!) 29   Ht 5\' 7"  (1.702 m)   Wt 70.3 kg   SpO2 92%   BMI 24.28 kg/m  SpO2: SpO2: 92 % O2 Device: O2 Device: High Flow Nasal Cannula O2 Flow Rate: O2 Flow Rate (L/min): 20 L/min  Intake/output summary:   Intake/Output Summary (Last 24 hours) at 08/17/2019 0835 Last data filed at 08/17/2019 [phone]  Gross per 24 hour  Intake 100 ml  Output 1700 ml  Net -1600 ml   LBM: Last BM Date: 08/06/19 Baseline Weight: Weight: 70.3 kg Most recent weight: Weight: 70.3 kg       Palliative Assessment/Data:      Patient Active Problem List   Diagnosis Date Noted  . Stage IV adenocarcinoma of lung, right ([place]) 08/14/2019  . Acute respiratory failure with hypoxemia ([place]) 08/12/2019  . SOB (shortness of breath)   . Postobstructive pneumonia 08/07/2019  . Pleural effusion on right 08/07/2019  . Prolonged QT interval 08/07/2019  . Mass of right lung   . Hyperlipidemia 09/27/2018  . Tobacco abuse 09/27/2018  . COPD (chronic obstructive pulmonary disease) ([place]) 09/27/2018  . Palpitations 09/27/2018    Palliative Care Assessment & Plan   Patient Profile:  Acute hypoxemic respiratory failure secondary to metastatic lung adenocarcinoma, severe deconditioning, emphysema, malignant right effusion and relative protein calorie malnutrition   Assessment:  frailty deconditioning Shortness of breath Anxiety   Recommendations/Plan: Add Chloraseptic throat spray/lozenges for symptom relief of sore throat and hoarseness from cough.  Continue current opioids and benzodiazepines.  Patient wishes to continue with current mode of care, she is in full understanding of the severity of her illness.   Code Status:    Code Status Orders  (From admission, onward)         Start     Ordered   08/15/19 0942  Do not attempt resuscitation (DNR)  Continuous    Question Answer  Comment  In the event of cardiac or respiratory ARREST Do not call a "code blue"   In the event of cardiac or respiratory ARREST Do not perform Intubation, CPR, defibrillation or ACLS   In the event of cardiac or respiratory ARREST Use medication by any route, position, wound care, and other measures to relive pain and suffering. May use oxygen, suction and manual treatment of airway obstruction as needed for comfort.      08/15/19 0941        Code Status History    Date Active Date Inactive Code Status Order ID Comments User Context   08/07/2019 2044 08/15/2019 0941 Full Code [id number]  Vianne Bulls, MD Inpatient   Advance Care Planning Activity    Advance Directive Documentation     Most Recent Value  Type of Advance Directive  Living will  Pre-existing out of facility DNR order (yellow form or pink MOST form)  -  "MOST" Form in Place?  -       Prognosis:   guarded   Discharge Planning:  To Be Determined  Care plan was discussed with  Patient and bedside RN.   Thank you for allowing the Palliative Medicine Team to assist in the care of this patient.   Time In: 8 Time Out: 8.25 Total Time 25 Prolonged Time Billed  no       Greater than 50%  of this time was spent counseling and coordinating care related to the above assessment and plan.  Loistine Chance, MD  Please contact Palliative Medicine Team phone at [phone] for questions and concerns.

## 2019-08-17 NOTE — Progress Notes (Signed)
No major clinical changes noted in the last few days.  He continues to be quite debilitated without any new complaints.  No updated results from her molecular testing at this time.  I have recommended continued supportive management and continuing radiation therapy and effort to improve her respiratory status.  The likelihood of a benefit from systemic therapy would be reasonably low given her overall debilitation and volume of disease.  Exception if there is a an actionable mutation detected on her molecular testing which will not be available for at least another week.  Transitioning to comfort care will be also very reasonable in the setting if no noticeable improvement or any decline is noted.  We will continue to follow.

## 2019-08-17 NOTE — Progress Notes (Signed)
NAME:  Jordan Clay, MRN:  [id number], DOB:  Aug 24, 1951, LOS: 9 ADMISSION DATE:  08/07/2019, CONSULTATION DATE:  10/20 REFERRING MD:  Reesa Chew (THR) , CHIEF COMPLAINT:  Cough, SOB  Brief History   68 yo female, smoker (40 pack years, quit 1 week PTA when she got pneumonia), with COPD, who presented to [facility] on 10/19 with reports of 2 weeks of cough & SOB.  She was treated for PNA by her PCP.  Work up included a CTA chest which showed obstruction of the central RLL bronchus with associated postobstructive PNA & pleural effusion.  She underwent thoracentesis on 10/20 with pleural fluid consistent with metastatic adenocarcinoma. FOB with EBUS on 10/21 with pathology consistent with non-small cell carcinoma.   Past Medical History   has a past medical history of COPD (chronic obstructive pulmonary disease) ([place]), High cholesterol, and Pneumonia.   Significant Hospital Events   10/19 Admit with post-obstructive PNA 10/20 PCCM consulted 10/20 IR thora  10/21 FOB with EBUS  10/24 Tx to SDU for hypoxic resp failure, cough, SOB 10/29 20L flow / 50% FiO2   Consults:  PCCM 10/20    Procedures:  Right Chest Tube 10/25 >>   Significant Diagnostic Tests:  CTA chest 10/19 >> No evidence of pulmonary embolism.  Obstruction of central right lower lobe bronchus and central low-attenuation, suspicious for centrally obstructing mass.  Near complete right lower lobe consolidation, suspicious for postobstructive pneumonitis. Moderate right pleural effusion. New bulky mediastinal and right hilar lymphadenopathy, consistent with metastatic disease.  Stable sub-cm bilateral upper lobe and right middle lobe pulmonary nodules. IR Thoracentesis 10/20 >> 700 cc  No improvement is sob or cough/ cyt pos adenocarcinoma of the lung Bronchoscopy 10/21 >> 90% obst distal BI/ with endobronchial ultrasound   Cytology R Pleural Fluid 10/20 >> metastatic adenocarcinoma  Cytology 7, FNA 10/21 >> non-small cell carcinoma   Cytology 11 R FNA 10/21 >> non-small cell carcinoma  Cytology RLL brushing 10/21 >> non-small cell, TTF-1 positive   Micro Data:  BC x 2 10/19 >> neg  COVID 10/19 >> neg  Pleural fluid  10/20 >> neg  BAL 10/21 >> neg  Antimicrobials:  Rocephin 10/19 >> 10/24 Azithro 10/19 >> 10/24 Unasyn 10/26 >>   Interim history/subjective:  Afebrile.  Chest tube drainage - approx 650 in last 24 hours. Pt indicates cough is better.   Objective   Blood pressure (!) 104/58, pulse (!) 102, temperature 98.2 F (36.8 C), temperature source Oral, resp. rate (!) 29, height _0  (1.702 m), weight 70.3 kg, SpO2 92 %.      FiO2 (%):  [55 %-65 %] 55 %   Intake/Output Summary (Last 24 hours) at 08/17/2019 3832 Last data filed at 08/17/2019 0511 Gross per 24 hour  Intake 100 ml  Output 1700 ml  Net -1600 ml   Filed Weights   08/07/19 1504  Weight: 70.3 kg    Examination: General: ill appearing adult female lying in bed in NAD HEENT: MM pink/moist, HFNC O2, anicteric, no nasal discharge  Neuro: talking in her sleep in Hebrew, awakens to voice, speech clear, states "I'm talking out of my head", anxiety improved / less jittery, MAE, generalized weakness CV: s1s2 rrr, SR on monitor, no m/r/g PULM:  Shallow, non-labored, mild tachypnea, clear on left, upper anterior breath sounds clear, diminished otherwise on the right, HFNC O2 in place, right chest tube in place with serous drainage in chamber GI: soft, bsx4 active  Extremities: warm/dry, trace pedal  edema  Skin: no rashes or lesions   Recent Labs  Lab 08/13/19 0244 08/15/19 0151  HGB 13.4 12.1  HCT 43.9 38.4  WBC 20.1* 28.5*  PLT 323 332   Recent Labs  Lab 08/11/19 0536 08/12/19 0605 08/13/19 0244 08/15/19 0151  NA  --   --  134* 138  K  --   --  4.6 4.5  CL  --   --  97* 98  CO2  --   --  25 30  GLUCOSE  --   --  170* 180*  BUN  --   --  15 21  CREATININE  --   --  0.64 0.55  CALCIUM  --   --  8.4* 8.2*  MG 1.9 2.0 2.2  --       Resolved Hospital Problem list     Assessment & Plan:    Non-Small Cell Carcinoma / Adenocarcinoma  Large mass, takes up ~ 1/3 of right lung and encases RML, RLL with pleural spread and entrapment. RUL has advanced emphysematous changes. S/p 2 rounds of palliative radiation.  Overall, frail without much improvement.   -continue palliative interventions  -clonazepam 0.5 mg BID   Cough -continue Tussionex Q6 for cyclical cough   Acute Hypoxemic Respiratory Failure  In setting of COPD, right lung mass with post-obstructive PNA, large effusion s/p chest tube, superimposed on emphysema and malnutrition.  -wean O2 for sats >88% -pulmonary hygiene as able  -continue solumedrol  -complete 5 days abx, doubt infectious component  -solumedrol 40 mg QD   Malignant Pleural Effusion  No significant improvement in symptom burden with placement of chest tube.  -no indication for PleurX catheter  -drainage in 24 hours too high for clamping 10/29 -follow intermittent CXR with chest tube in place   Noe Gens, NP-C [facility] 08/17/2019, 8:46 AM

## 2019-08-18 ENCOUNTER — Ambulatory Visit
Admit: 2019-08-18 | Discharge: 2019-08-18 | Disposition: A | Discharge status: Home / Self Care | Payer: Medicare Other | Attending: Radiation Oncology | Admitting: Radiation Oncology

## 2019-08-18 DIAGNOSIS — R918 Other nonspecific abnormal finding of lung field: Secondary | ICD-10-CM | POA: Diagnosis not present

## 2019-08-18 LAB — GLUCOSE, CAPILLARY
Glucose-Capillary: 111 mg/dL — ABNORMAL HIGH (ref 70–99)
Glucose-Capillary: 128 mg/dL — ABNORMAL HIGH (ref 70–99)
Glucose-Capillary: 136 mg/dL — ABNORMAL HIGH (ref 70–99)
Glucose-Capillary: 119 mg/dL — ABNORMAL HIGH (ref 70–99)
Glucose-Capillary: 114 mg/dL — ABNORMAL HIGH (ref 70–99)
Glucose-Capillary: 122 mg/dL — ABNORMAL HIGH (ref 70–99)

## 2019-08-18 MED ORDER — CLONAZEPAM 0.5 MG PO TABS: 0.2500 mg | ORAL_TABLET | Freq: Two times a day (BID) | ORAL | Status: DC

## 2019-08-18 MED ORDER — RAMELTEON 8 MG PO TABS
8.0000 mg | ORAL_TABLET | Freq: Every day | ORAL | Status: DC
  Administered 2019-08-18 – 2019-08-31 (×9): 8 mg via ORAL
  Filled 2019-08-18 (×14): qty 1

## 2019-08-18 NOTE — Progress Notes (Signed)
Upon assessment, pt's hands/toes purple, warm. MD Tamala Julian notified. O2 sats 94. Will continue to monitor.

## 2019-08-18 NOTE — Progress Notes (Addendum)
NAME:  Jordan Clay, MRN:  [id number], DOB:  Oct 31, 1950, LOS: 43 ADMISSION DATE:  08/07/2019, CONSULTATION DATE:  10/20 REFERRING MD:  Reesa Chew (THR) , CHIEF COMPLAINT:  Cough, SOB  Brief History   68 yo female, smoker (40 pack years, quit 1 week PTA when she got pneumonia), with COPD, who presented to [facility] on 10/19 with reports of 2 weeks of cough & SOB.  She was treated for PNA by her PCP.  Work up included a CTA chest which showed obstruction of the central RLL bronchus with associated postobstructive PNA & pleural effusion.  She underwent thoracentesis on 10/20 with pleural fluid consistent with metastatic adenocarcinoma. FOB with EBUS on 10/21 with pathology consistent with non-small cell carcinoma.   Past Medical History   has a past medical history of COPD (chronic obstructive pulmonary disease) ([place]), High cholesterol, and Pneumonia.   Significant Hospital Events   10/19 Admit with post-obstructive PNA 10/20 PCCM consulted 10/20 IR thora  10/21 FOB with EBUS  10/24 Tx to SDU for hypoxic resp failure, cough, SOB 10/29 20L flow / 50% FiO2, 1100 ml out of chest tube 1030 20L flow / 45% FiO2   Consults:  PCCM 10/20    Procedures:  Right Chest Tube 10/25 >>   Significant Diagnostic Tests:  CTA chest 10/19 >> No evidence of pulmonary embolism.  Obstruction of central right lower lobe bronchus and central low-attenuation, suspicious for centrally obstructing mass.  Near complete right lower lobe consolidation, suspicious for postobstructive pneumonitis. Moderate right pleural effusion. New bulky mediastinal and right hilar lymphadenopathy, consistent with metastatic disease.  Stable sub-cm bilateral upper lobe and right middle lobe pulmonary nodules. IR Thoracentesis 10/20 >> 700 cc  No improvement is sob or cough/ cyt pos adenocarcinoma of the lung Bronchoscopy 10/21 >> 90% obst distal BI/ with endobronchial ultrasound   Cytology R Pleural Fluid 10/20 >> metastatic adenocarcinoma   Cytology 7, FNA 10/21 >> non-small cell carcinoma  Cytology 11 R FNA 10/21 >> non-small cell carcinoma  Cytology RLL brushing 10/21 >> non-small cell, TTF-1 positive   Micro Data:  BC x 2 10/19 >> neg  COVID 10/19 >> neg  Pleural fluid  10/20 >> neg  BAL 10/21 >> neg  Antimicrobials:  Rocephin 10/19 >> 10/24 Azithro 10/19 >> 10/24 Unasyn 10/26 >>   Interim history/subjective:  Pt reports she "froze" last night.  300 ml out of chest tube thus far.  Afebrile.  O2 weaned to 45%, 20L flow  Objective   Blood pressure (!) 98/42, pulse 96, temperature 98.4 F (36.9 C), temperature source Oral, resp. rate (!) 26, height _0  (1.702 m), weight 70.3 kg, SpO2 93 %.      FiO2 (%):  [45 %-50 %] 45 %   Intake/Output Summary (Last 24 hours) at 08/18/2019 0804 Last data filed at 08/18/2019 0701 Gross per 24 hour  Intake 1512.63 ml  Output 1500 ml  Net 12.63 ml   Filed Weights   08/07/19 1504  Weight: 70.3 kg    Examination: General: chronically ill appearing female sitting up in bed in NAD   HEENT: MM pink/dry, no jvd, HFNC O2, anicteric, no nasal drainage  Neuro: awakens, speech clear, oriented to self, place, at times disoriented / delirious, MAE, generalized weakness but non-focal exam  CV: s1s2 rrr, no m/r/g PULM:  Shallow, non-labored, improved air movement on right with crackles, clear on left.  Right chest tube with serous drainage GI: soft, bsx4 active  Extremities: warm/dry, trace pedal  edema  Skin: no rashes or lesions   Recent Labs  Lab 08/13/19 0244 08/15/19 0151  HGB 13.4 12.1  HCT 43.9 38.4  WBC 20.1* 28.5*  PLT 323 332   Recent Labs  Lab 08/12/19 0605 08/13/19 0244 08/15/19 0151  NA  --  134* 138  K  --  4.6 4.5  CL  --  97* 98  CO2  --  25 30  GLUCOSE  --  170* 180*  BUN  --  15 21  CREATININE  --  0.64 0.55  CALCIUM  --  8.4* 8.2*  MG 2.0 2.2  --      Resolved Hospital Problem list     Assessment & Plan:    Non-Small Cell Carcinoma /  Adenocarcinoma  Large mass, takes up ~ 1/3 of right lung and encases RML, RLL with pleural spread and entrapment. RUL has advanced emphysematous changes. S/p 2 rounds of palliative radiation.  Overall, frail without much improvement.   -continue XRT, appreciate RAD ONC, ONC -continue palliative interventions  -stop clonazepam -rozerem QHS -delirium prevention measures  Cough -tussionex Q6 for cough   Acute Hypoxemic Respiratory Failure  In setting of COPD, right lung mass with post-obstructive PNA, large effusion s/p chest tube, superimposed on emphysema and malnutrition.  -solumedrol 40 mg QD, wean as tolerated  -wean O2 for sats >885 -complete 5 days abx (stop date in place), doubt infectious component (obstructive) -pulmonary hygiene as able   Malignant Pleural Effusion  No significant improvement in symptom burden with placement of chest tube.  -no indication for PleurX at this time  -drainage too high for clamping  -follow intermittent CXR with chest tube in place    Noe Gens, NP-C [facility] 08/18/2019, 8:04 AM

## 2019-08-19 ENCOUNTER — Inpatient Hospital Stay (HOSPITAL_COMMUNITY): Payer: Medicare Other

## 2019-08-19 DIAGNOSIS — J90 Pleural effusion, not elsewhere classified: Secondary | ICD-10-CM | POA: Diagnosis not present

## 2019-08-19 DIAGNOSIS — R918 Other nonspecific abnormal finding of lung field: Secondary | ICD-10-CM | POA: Diagnosis not present

## 2019-08-19 DIAGNOSIS — J9601 Acute respiratory failure with hypoxia: Secondary | ICD-10-CM | POA: Diagnosis not present

## 2019-08-19 LAB — BASIC METABOLIC PANEL
Anion gap: 10 (ref 5–15)
BUN: 20 mg/dL (ref 8–23)
CO2: 24 mmol/L (ref 22–32)
Calcium: 7.6 mg/dL — ABNORMAL LOW (ref 8.9–10.3)
Chloride: 102 mmol/L (ref 98–111)
Creatinine, Ser: 0.65 mg/dL (ref 0.44–1.00)
GFR calc Af Amer: >60 mL/min (ref >60)
GFR calc non Af Amer: >60 mL/min (ref >60)
Glucose, Bld: 122 mg/dL — ABNORMAL HIGH (ref 70–99)
Potassium: 4 mmol/L (ref 3.5–5.1)
Sodium: 136 mmol/L (ref 135–145)

## 2019-08-19 LAB — CBC
HCT: 39.9 % (ref 36.0–46.0)
Hemoglobin: 12.7 g/dL (ref 12.0–15.0)
MCH: 29.8 pg (ref 26.0–34.0)
MCHC: 31.8 g/dL (ref 30.0–36.0)
MCV: 93.7 fL (ref 80.0–100.0)
Platelets: 301 10*3/uL (ref 150–400)
RBC: 4.26 MIL/uL (ref 3.87–5.11)
RDW: 12.7 % (ref 11.5–15.5)
WBC: 20.5 10*3/uL — ABNORMAL HIGH (ref 4.0–10.5)
nRBC: 0 % (ref 0.0–0.2)

## 2019-08-19 LAB — GLUCOSE, CAPILLARY
Glucose-Capillary: 132 mg/dL — ABNORMAL HIGH (ref 70–99)
Glucose-Capillary: 134 mg/dL — ABNORMAL HIGH (ref 70–99)
Glucose-Capillary: 140 mg/dL — ABNORMAL HIGH (ref 70–99)
Glucose-Capillary: 106 mg/dL — ABNORMAL HIGH (ref 70–99)
Glucose-Capillary: 101 mg/dL — ABNORMAL HIGH (ref 70–99)

## 2019-08-19 MED ORDER — MIDAZOLAM HCL 2 MG/2ML IJ SOLN
INTRAMUSCULAR | Status: AC
  Filled 2019-08-19: qty 2

## 2019-08-19 MED ORDER — MIDAZOLAM HCL 2 MG/2ML IJ SOLN
INTRAMUSCULAR | Status: AC
  Filled 2019-08-19: qty 4

## 2019-08-19 MED ORDER — MIDAZOLAM HCL 2 MG/2ML IJ SOLN
0.5000 mg | Freq: Once | INTRAMUSCULAR | Status: AC
  Administered 2019-08-19: 0.5 mg via INTRAVENOUS

## 2019-08-19 MED ORDER — FENTANYL CITRATE (PF) 100 MCG/2ML IJ SOLN
INTRAMUSCULAR | Status: AC
  Administered 2019-08-19: 12:00:00
  Filled 2019-08-19: qty 2

## 2019-08-19 NOTE — Progress Notes (Signed)
Patient has been very sleepy all day and has wanted minimal activity to allow for rest. Refused various medications off and on including insulin, pain medication, and home meds. Patient educated on reasoning behind various medications. Will continue to group care efforts and minimize interruptions in rest.

## 2019-08-19 NOTE — Progress Notes (Signed)
Patient resting on initial assessment then after nebulizer patient called out with persistent coughing and respiratory distress. Patient highly anxious aggressively coughing and unable to catch her breath. Patient sat up and attempted to help clear her throat. Patient initially refusing morphine but after MD called to bedside patient agreeable to morphine for [facility]. MD attempted manual chest PT patient did not tolerate. Once morphine given and patient calmed down, distress relieved partially. Patient states she feels secretions stuck in her throat. NT suction attempted with MD at bedside, patient did not tolerate and pulled suction catheter out. Patient then able to cough up a small amount of yellow sputum. Patient remains very anxious and tachypnic. 0.5 of Versed ordered by MD at bedside and administered with relief. O2 98%.

## 2019-08-19 NOTE — Procedures (Addendum)
Tunneled pleural catheter placement with imaging guidance (CPT 32550)  Indication: Right Malignant effusion  Consent: Signed and in chart  Surgeon: Erskine Emery MD  Anesthesia: 42mcg fentanyl  Procedure - Timeout performed - Site of effusion marked with [place] - Area cleaned, prepped, and draped - 10cc 1% subcutaneous lidocaine injected to anesthetize area - 1.5 cm incision made overlying fluid and another about 5 cm anterior to this along chest wall - PleurX catheter inserted in usual sterile fashion using modified seldinger technique - Cathter hooked to suction - After fluid aspirated, old ches tube removed, pleurX capped and sterile dressing applied  Findings - 900 cc's of serous fluid obtained - CXR pending  Specimen(s): none  Complications: none immediate

## 2019-08-19 NOTE — Progress Notes (Signed)
NAME:  Jordan Clay, MRN:  [id number], DOB:  [birth year], LOS: 55 ADMISSION DATE:  08/07/2019, CONSULTATION DATE:  10/20 REFERRING MD:  Reesa Chew (THR) , CHIEF COMPLAINT:  Cough, SOB  Brief History   68 yo female, smoker (40 pack years, quit 1 week PTA when she got pneumonia), with COPD, who presented to [facility] on 10/19 with reports of 2 weeks of cough & SOB.  She was treated for PNA by her PCP.  Work up included a CTA chest which showed obstruction of the central RLL bronchus with associated postobstructive PNA & pleural effusion.  She underwent thoracentesis on 10/20 with pleural fluid consistent with metastatic adenocarcinoma. FOB with EBUS on 10/21 with pathology consistent with non-small cell carcinoma.   Past Medical History   has a past medical history of COPD (chronic obstructive pulmonary disease) ([place]), High cholesterol, and Pneumonia.   Significant Hospital Events   10/19 Admit with post-obstructive PNA 10/20 PCCM consulted 10/20 IR thora  10/21 FOB with EBUS  10/24 Tx to SDU for hypoxic resp failure, cough, SOB 10/29 20L flow / 50% FiO2, 1100 ml out of chest tube 1030 20L flow / 45% FiO2   Consults:  PCCM 10/20    Procedures:  Right Chest Tube 10/25 >>   Significant Diagnostic Tests:  CTA chest 10/19 >> No evidence of pulmonary embolism.  Obstruction of central right lower lobe bronchus and central low-attenuation, suspicious for centrally obstructing mass.  Near complete right lower lobe consolidation, suspicious for postobstructive pneumonitis. Moderate right pleural effusion. New bulky mediastinal and right hilar lymphadenopathy, consistent with metastatic disease.  Stable sub-cm bilateral upper lobe and right middle lobe pulmonary nodules. IR Thoracentesis 10/20 >> 700 cc  No improvement is sob or cough/ cyt pos adenocarcinoma of the lung Bronchoscopy 10/21 >> 90% obst distal BI/ with endobronchial ultrasound   Cytology R Pleural Fluid 10/20 >> metastatic adenocarcinoma   Cytology 7, FNA 10/21 >> non-small cell carcinoma  Cytology 11 R FNA 10/21 >> non-small cell carcinoma  Cytology RLL brushing 10/21 >> non-small cell, TTF-1 positive   Micro Data:  BC x 2 10/19 >> neg  COVID 10/19 >> neg  Pleural fluid  10/20 >> neg  BAL 10/21 >> neg  Antimicrobials:  Rocephin 10/19 >> 10/24 Azithro 10/19 >> 10/24 Unasyn 10/26 >>   Interim history/subjective:  Bad coughing fit this AM eventually relieved with expectorating some greenish sputum Remains on 40% 20L  Objective   Blood pressure (!) 95/48, pulse (!) 102, temperature 98.2 F (36.8 C), temperature source Oral, resp. rate (!) 22, height _0  (1.702 m), weight 70.3 kg, SpO2 96 %.      FiO2 (%):  [40 %] 40 %   Intake/Output Summary (Last 24 hours) at 08/19/2019 0902 Last data filed at 08/19/2019 0600 Gross per 24 hour  Intake 1602.42 ml  Output 500 ml  Net 1102.42 ml   Filed Weights   08/07/19 1504  Weight: 70.3 kg    Examination: GEN: thin ill appearing woman in mild distress coughing HEENT: MMM, trachea midline, +temporal wasting CV: Tachycardic, ext warm PULM: better air movement on R but has residual crackles, chest tube with no tidaling or air leak GI: soft, +BS EXT: muscle wasting NEURO: moves all 4 ext to command PSYCH: Anxious SKIN: No rashes    Recent Labs  Lab 08/13/19 0244 08/15/19 0151 08/19/19 0241  HGB 13.4 12.1 12.7  HCT 43.9 38.4 39.9  WBC 20.1* 28.5* 20.5*  PLT 323 332 301  Recent Labs  Lab 08/13/19 0244 08/15/19 0151 08/19/19 0241  NA 134* 138 136  K 4.6 4.5 4.0  CL 97* 98 102  CO2 _0 GLUCOSE 170* 180* 122*  BUN _1 CREATININE 0.64 0.55 0.65  CALCIUM 8.4* 8.2* 7.6*  MG 2.2  --   --      Resolved Hospital Problem list     Assessment & Plan:  # Acute hypoxemic respiratory failure secondary to extensive lung adenocarcinoma and associated effusion. # Protein calorie malnutrition- present on admission # Muscular deconditioning-  limiting treatment options  Chest tube stopped draining which may actually be serendipitous; effusion present on [place] so will proceed with R pleurX placement today.  Radiation per radonc  Awaiting molecular testing on tumor  Continue pain/sedation, pulmonary hygeine regimen per orders  Will update family when they come in  Erskine Emery MD

## 2019-08-20 LAB — GLUCOSE, CAPILLARY
Glucose-Capillary: 101 mg/dL — ABNORMAL HIGH (ref 70–99)
Glucose-Capillary: 106 mg/dL — ABNORMAL HIGH (ref 70–99)
Glucose-Capillary: 123 mg/dL — ABNORMAL HIGH (ref 70–99)
Glucose-Capillary: 136 mg/dL — ABNORMAL HIGH (ref 70–99)
Glucose-Capillary: 141 mg/dL — ABNORMAL HIGH (ref 70–99)
Glucose-Capillary: 167 mg/dL — ABNORMAL HIGH (ref 70–99)

## 2019-08-20 MED ORDER — LIDOCAINE HCL (PF) 1 % IJ SOLN
2.0000 mL | Freq: Four times a day (QID) | INTRAMUSCULAR | Status: DC
  Administered 2019-08-20: 2 mL
  Administered 2019-08-20: 5 mL
  Administered 2019-08-20: 2 mL
  Filled 2019-08-20 (×3): qty 5

## 2019-08-20 NOTE — Progress Notes (Addendum)
NAME:  Jordan Clay, MRN:  [id number], DOB:  Sep 06, 1951, LOS: 40 ADMISSION DATE:  08/07/2019, CONSULTATION DATE:  10/20 REFERRING MD:  Reesa Chew (THR) , CHIEF COMPLAINT:  Cough, SOB  Brief History   68 yo female, smoker (40 pack years, quit 1 week PTA when she got pneumonia), with COPD, who presented to [facility] on 10/19 with reports of 2 weeks of cough & SOB.  She was treated for PNA by her PCP.  Work up included a CTA chest which showed obstruction of the central RLL bronchus with associated postobstructive PNA & pleural effusion.  She underwent thoracentesis on 10/20 with pleural fluid consistent with metastatic adenocarcinoma. FOB with EBUS on 10/21 with pathology consistent with non-small cell carcinoma.   Past Medical History   has a past medical history of COPD (chronic obstructive pulmonary disease) ([place]), High cholesterol, and Pneumonia.   Significant Hospital Events   10/19 Admit with post-obstructive PNA 10/20 PCCM consulted 10/20 IR thora  10/21 FOB with EBUS  10/24 Tx to SDU for hypoxic resp failure, cough, SOB 10/29 20L flow / 50% FiO2, 1100 ml out of chest tube 1030 20L flow / 45% FiO2   Consults:  PCCM 10/20    Procedures:  Right Chest Tube 10/25 >> 10/31 PleurX 10/31  Significant Diagnostic Tests:  CTA chest 10/19 >> No evidence of pulmonary embolism.  Obstruction of central right lower lobe bronchus and central low-attenuation, suspicious for centrally obstructing mass.  Near complete right lower lobe consolidation, suspicious for postobstructive pneumonitis. Moderate right pleural effusion. New bulky mediastinal and right hilar lymphadenopathy, consistent with metastatic disease.  Stable sub-cm bilateral upper lobe and right middle lobe pulmonary nodules. IR Thoracentesis 10/20 >> 700 cc  No improvement is sob or cough/ cyt pos adenocarcinoma of the lung Bronchoscopy 10/21 >> 90% obst distal BI/ with endobronchial ultrasound   Cytology R Pleural Fluid 10/20 >> metastatic  adenocarcinoma  Cytology 7, FNA 10/21 >> non-small cell carcinoma  Cytology 11 R FNA 10/21 >> non-small cell carcinoma  Cytology RLL brushing 10/21 >> non-small cell, TTF-1 positive   Micro Data:  BC x 2 10/19 >> neg  COVID 10/19 >> neg  Pleural fluid  10/20 >> neg  BAL 10/21 >> neg  Antimicrobials:  Rocephin 10/19 >> 10/24 Azithro 10/19 >> 10/24 Unasyn 10/26 >> 10/31  Interim history/subjective:  Slept better last night. Still having miserable bouts of coughing. Having numbness along left lower jaw intermittently. Also some abd cramping but did have BM last night. Down to 4L O2.  Objective   Blood pressure (!) 101/49, pulse (!) 102, temperature 98.2 F (36.8 C), temperature source Oral, resp. rate (!) 27, height _0  (1.702 m), weight 70.3 kg, SpO2 99 %.          Intake/Output Summary (Last 24 hours) at 08/20/2019 0811 Last data filed at 08/20/2019 0600 Gross per 24 hour  Intake 1043.93 ml  Output 1550 ml  Net -506.07 ml   Filed Weights   08/07/19 1504  Weight: 70.3 kg    Examination: GEN: thin ill appearing woman in no distress HEENT: MMM, trachea midline, +temporal wasting CV: Tachycardic, ext warm PULM: better air movement on R but has residual crackles, pleurx site CDI GI: soft, +BS EXT: muscle wasting NEURO: moves all 4 ext to command PSYCH: Anxious SKIN: No rashes  No labs  Resolved Hospital Problem list     Assessment & Plan:  # Acute hypoxemic respiratory failure secondary to extensive lung adenocarcinoma and  associated effusion.  On radiation therapy, molecular markers pending.  O2 requirements are now much more reasonable.  # Protein calorie malnutrition- present on admission # Muscular deconditioning- limiting treatment options # Delirium- intermittent issue  - Wean O2 to maintain sats > 90% - Up to chair when son gets here, PT/OT consults - Make lidocaine with duonebs standing, PRN cough meds as ordered - Await molecular markers to discuss  goals of care - Would drain pleurX M/W/F up to 1L each time, can increase frequency PRN, check AM CXR.   Lung is entrapped so relief of dyspnea is goal of drainage. - Needs sutures removed from PleurX insertion site around 11/9 - 10 radiation treatments planned, cycle 4 was 10/29  Erskine Emery MD

## 2019-08-21 ENCOUNTER — Inpatient Hospital Stay (HOSPITAL_COMMUNITY): Payer: Medicare Other

## 2019-08-21 ENCOUNTER — Ambulatory Visit
Admission: RE | Admit: 2019-08-21 | Discharge: 2019-08-21 | Disposition: A | Discharge status: Home / Self Care | Payer: Medicare Other | Source: Ambulatory Visit | Attending: Radiation Oncology | Admitting: Radiation Oncology

## 2019-08-21 LAB — GLUCOSE, CAPILLARY
Glucose-Capillary: 118 mg/dL — ABNORMAL HIGH (ref 70–99)
Glucose-Capillary: 147 mg/dL — ABNORMAL HIGH (ref 70–99)
Glucose-Capillary: 183 mg/dL — ABNORMAL HIGH (ref 70–99)
Glucose-Capillary: 160 mg/dL — ABNORMAL HIGH (ref 70–99)
Glucose-Capillary: 150 mg/dL — ABNORMAL HIGH (ref 70–99)
Glucose-Capillary: 121 mg/dL — ABNORMAL HIGH (ref 70–99)

## 2019-08-21 NOTE — Progress Notes (Signed)
Performed an in and out cath and yielded 21mL of clear, yellow urine. Will re-access at 6pm.

## 2019-08-21 NOTE — Progress Notes (Signed)
Drained 96mL out via pleurx. Patient tolerated well.

## 2019-08-21 NOTE — Progress Notes (Signed)
Events last few days noted.  He continues to have slow but noticeable improvement in her overall clinical status.  She does report sleeping most of the night and being more comfortable.  She does report occasional violent spells of coughing.  He was able to sit in the chair without major difficulties.  She still quite fatigued and debilitated and has poor nutritional status.  She has tolerated radiation reasonably well without any complications.    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  Molecular testing remains pending and will likely not know anything till the end of the week.  I recommended continued supportive management and I will share with her any information we have.  If she has actionable mutation that can be used to to target her cancer, disease might be able to be palliated with systemic oral therapy.  Overall she is not candidate for systemic chemotherapy at this time.  We will continue to follow and provide update and support in her case and answer any questions she has.  She understands that she has a poor prognosis despite the modest improvement she has experienced.  15  minutes was spent with the patient face-to-face today.  More than 50% of time was spent on reviewing her disease status, discussing treatment options for the future as well as overall prognosis.

## 2019-08-21 NOTE — Progress Notes (Signed)
OT Cancellation Note  Patient Details Name: Jordan Clay MRN: [id number] DOB: 1951/02/29   Cancelled Treatment:    Reason Eval/Treat Not Completed: Other (comment)   Pt working with nursing.  OT did speak with RN and pt fatigued this am. Will check on pt later this day or next day.  Kari Baars, OT Acute Rehabilitation Services [phone] Office- [phone], Edwena Felty D 08/21/2019, 12:00 PM

## 2019-08-21 NOTE — Progress Notes (Signed)
NAME:  Jordan Clay, MRN:  [id number], DOB:  [birth year], LOS: 14 ADMISSION DATE:  08/07/2019, CONSULTATION DATE:  10/20 REFERRING MD:  Reesa Chew (THR) , CHIEF COMPLAINT:  Cough, SOB  Brief History   68 yo female, smoker (40 pack years, quit 1 week PTA when she got pneumonia), with COPD, who presented to [facility] on 10/19 with reports of 2 weeks of cough & SOB.  She was treated for PNA by her PCP.  Work up included a CTA chest which showed obstruction of the central RLL bronchus with associated postobstructive PNA & pleural effusion.  She underwent thoracentesis on 10/20 with pleural fluid consistent with metastatic adenocarcinoma. FOB with EBUS on 10/21 with pathology consistent with non-small cell carcinoma.   Past Medical History   has a past medical history of COPD (chronic obstructive pulmonary disease) ([place]), High cholesterol, and Pneumonia.   Significant Hospital Events   10/19 Admit with post-obstructive PNA 10/20 PCCM consulted 10/20 IR thora  10/21 FOB with EBUS  10/24 Tx to SDU for hypoxic resp failure, cough, SOB 10/29 20L flow / 50% FiO2, 1100 ml out of chest tube 1030 20L flow / 45% FiO2   Consults:  PCCM 10/20    Procedures:  Right Chest Tube 10/25 >> 10/31 PleurX 10/31  Significant Diagnostic Tests:  CTA chest 10/19 >> No evidence of pulmonary embolism.  Obstruction of central right lower lobe bronchus and central low-attenuation, suspicious for centrally obstructing mass.  Near complete right lower lobe consolidation, suspicious for postobstructive pneumonitis. Moderate right pleural effusion. New bulky mediastinal and right hilar lymphadenopathy, consistent with metastatic disease.  Stable sub-cm bilateral upper lobe and right middle lobe pulmonary nodules. IR Thoracentesis 10/20 >> 700 cc  No improvement is sob or cough/ cyt pos adenocarcinoma of the lung Bronchoscopy 10/21 >> 90% obst distal BI/ with endobronchial ultrasound   Cytology R Pleural Fluid 10/20 >> metastatic  adenocarcinoma  Cytology 7, FNA 10/21 >> non-small cell carcinoma  Cytology 11 R FNA 10/21 >> non-small cell carcinoma  Cytology RLL brushing 10/21 >> non-small cell, TTF-1 positive   Micro Data:  BC x 2 10/19 >> neg  COVID 10/19 >> neg  Pleural fluid  10/20 >> neg  BAL 10/21 >> neg  Antimicrobials:  Rocephin 10/19 >> 10/24 Azithro 10/19 >> 10/24 Unasyn 10/26 >> 10/31  Interim history/subjective:  Still having issues with fatigue Intermittent coughing Oxygen requirement improving  Objective   Blood pressure 93/64, pulse 100, temperature 98.1 F (36.7 C), temperature source Oral, resp. rate 17, height _0  (1.702 m), weight 70.3 kg, SpO2 98 %.          Intake/Output Summary (Last 24 hours) at 08/21/2019 0858 Last data filed at 08/20/2019 2300 Gross per 24 hour  Intake 187.93 ml  Output 400 ml  Net -212.07 ml   Filed Weights   08/07/19 1504  Weight: 70.3 kg    Examination: GEN: Frail, chronically ill-appearing HEENT: Moist oral mucosa CV: S1-S2 appreciated, tachycardic PULM: Fair air movement on the left, decreased on the right  GI: Soft, bowel sounds appreciated EXT: Also wasting NEURO: moves all 4 ext to command PSYCH: Does appear anxious SKIN: Warm and dry  No labs Chest x-ray reviewed by myself showing a small pneumo, chest tube in place, reduced size of right pleural effusion  Resolved Hospital Problem list     Assessment & Plan:  Marland Kitchen  Acute hypoxemic respiratory failure -Metastatic adenocarcinoma of the lung -Trapped lung -Large pleural effusion -Multifactorial reasons  for respiratory failure  -Oxygen requirement appears to be improving  .  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  Loculated effusion .  Trapped lung -Has Pleurx catheter in place for drainage of effusion -Plan with drainage is to drain to comfort -Molecular markers pending for lung cancer -Radiation treatments-plan for 10 treatments, has had 4 cycles so far  .  Likely  underlying obstructive lung disease -As needed bronchodilators  .  Cough -Antitussives  .  Deconditioning -Physical therapy -Encouraged to do passive movements while in bed  .  Pleural effusion -Plan is to drain Monday, Wednesday, Friday up to 1 L each time -Remove probiotics sutures around 11/9  Sherrilyn Rist, MD [place], PCCM

## 2019-08-21 NOTE — Evaluation (Signed)
Physical Therapy Evaluation Patient Details Name: Jordan Clay MRN: [id number] DOB: 09-Sep-1951 Today's Date: 08/21/2019   History of Present Illness  68 yo female admitted with shortness of breath. Imaging (+) R lung mass. New diagnosis of stage IV met lung cancer. S/P thoracotomy. Pleurx catheter 10/31. Hx of COPD, smoker.  Clinical Impression  On eval, pt required Min assist +2 for assistance/safety/equipment. Pt was able to stand x 2 then pivot, bed to recliner, using RW. Pt is weak and fatigues very quickly/easily with minimal activity. Frequent rest breaks needed/taken. O2 sats dropped to 85% on 5L [place] with activity. Cues for safety and pursed lip breathing required throughout session. Discussed d/c plan with pt's son-plan is for home with family assistance. Will continue to follow, assess, and progress activity as pt is able to tolerate.     Follow Up Recommendations Home health PT;Supervision/Assistance - 24 hour    Equipment Recommendations  Rolling walker with 5" wheels;3in1;Wheelchair;Hospital bed; Home Health Aide   Recommendations for Other Services       Precautions / Restrictions Precautions Precautions: Fall Precaution Comments: monitor HR, BP, O2 Restrictions Weight Bearing Restrictions: No      Mobility  Bed Mobility Overal bed mobility: Needs Assistance Bed Mobility: Supine to Sit     Supine to sit: Min guard;+2 for safety/equipment;HOB elevated     General bed mobility comments: Close guard for safety, managing lines. Cues for pursed lip breathing once sitting EOB. O2 sats dropped to 87% on 5L [place]. With time and breathing, increased to 92%  Transfers Overall transfer level: Needs assistance Equipment used: Rolling walker (2 wheeled) Transfers: Sit to/from [facility] Sit to Stand: Min assist;+2 safety/equipment;+2 physical assistance Stand pivot transfers: Min assist;+2 safety/equipment;+2 physical assistance       General transfer  comment: x2. Assist to rise, stabilize, control descent. VCs safety, technique, hand placement. Stand piviot, bed to recliner, with RW. O2 sats dropped to 85%. With time and breathing, increased to 91%.  Ambulation/Gait             General Gait Details: NT-pt unable to tolearte ambulation at this time 2* cardiopulmonary status.  Stairs            Wheelchair Mobility    Modified Rankin (Stroke Patients Only)       Balance Overall balance assessment: Needs assistance         Standing balance support: Bilateral upper extremity supported Standing balance-Leahy Scale: Poor                               Pertinent Vitals/Pain Pain Assessment: Faces Faces Pain Scale: Hurts even more Pain Location: chest Pain Descriptors / Indicators: Aching;Discomfort;Sore Pain Intervention(s): Limited activity within patient's tolerance;Repositioned    Home Living Family/patient expects to be discharged to:: Private residence Living Arrangements: Spouse/significant other Available Help at Discharge: Family Type of Home: House Home Access: Stairs to enter   Technical brewer of Steps: 3 Home Layout: Two level;1/2 bath on main level Home Equipment: None      Prior Function Level of Independence: Independent               Hand Dominance        Extremity/Trunk Assessment   Upper Extremity Assessment Upper Extremity Assessment: Defer to OT evaluation    Lower Extremity Assessment Lower Extremity Assessment: Generalized weakness    Cervical / Trunk Assessment Cervical / Trunk Assessment: Normal  Communication   Communication: No difficulties  Cognition Arousal/Alertness: Awake/alert Behavior During Therapy: Anxious Overall Cognitive Status: Impaired/Different from baseline Area of Impairment: Problem solving                             Problem Solving: Requires verbal cues;Requires tactile cues        General Comments       Exercises     Assessment/Plan    PT Assessment Patient needs continued PT services  PT Problem List Decreased strength;Decreased mobility;Decreased activity tolerance;Decreased balance;Decreased knowledge of use of DME;Cardiopulmonary status limiting activity       PT Treatment Interventions DME instruction;Gait training;Therapeutic exercise;Therapeutic activities;Patient/family education;Balance training;Functional mobility training    PT Goals (Current goals can be found in the Care Plan section)  Acute Rehab PT Goals Patient Stated Goal: to breathe better pr pt. per son, to get pt home PT Goal Formulation: With patient/family Time For Goal Achievement: 09/04/19 Potential to Achieve Goals: Fair    Frequency Min 3X/week   Barriers to discharge        Co-evaluation               AM-PAC PT "6 Clicks" Mobility  Outcome Measure Help needed turning from your back to your side while in a flat bed without using bedrails?: A Little Help needed moving from lying on your back to sitting on the side of a flat bed without using bedrails?: A Little Help needed moving to and from a bed to a chair (including a wheelchair)?: A Lot Help needed standing up from a chair using your arms (e.g., wheelchair or bedside chair)?: A Little Help needed to walk in hospital room?: A Lot Help needed climbing 3-5 steps with a railing? : Total 6 Click Score: 14    End of Session Equipment Utilized During Treatment: Gait belt;Oxygen Activity Tolerance: Patient limited by fatigue(Limited by O2/breathing) Patient left: in chair;with call bell/phone within reach;with family/visitor present   PT Visit Diagnosis: Pain;Muscle weakness (generalized) (M62.81);Difficulty in walking, not elsewhere classified (R26.2) Pain - part of body: (chest)    Time: 5300-5110 PT Time Calculation (min) (ACUTE ONLY): 29 min   Charges:   PT Evaluation $PT Eval Moderate Complexity: 1 Mod PT Treatments $Therapeutic  Activity: 8-22 mins         Weston Anna, PT Acute Rehabilitation Services Pager: [phone] Office: [phone]

## 2019-08-22 ENCOUNTER — Telehealth: Payer: Self-pay | Admitting: Radiation Oncology

## 2019-08-22 ENCOUNTER — Encounter (HOSPITAL_COMMUNITY): Payer: Self-pay | Admitting: Oncology

## 2019-08-22 ENCOUNTER — Ambulatory Visit
Admit: 2019-08-22 | Discharge: 2019-08-22 | Disposition: A | Discharge status: Home / Self Care | Payer: Medicare Other | Attending: Radiation Oncology | Admitting: Radiation Oncology

## 2019-08-22 LAB — GLUCOSE, CAPILLARY
Glucose-Capillary: 116 mg/dL — ABNORMAL HIGH (ref 70–99)
Glucose-Capillary: 159 mg/dL — ABNORMAL HIGH (ref 70–99)
Glucose-Capillary: 144 mg/dL — ABNORMAL HIGH (ref 70–99)
Glucose-Capillary: 145 mg/dL — ABNORMAL HIGH (ref 70–99)
Glucose-Capillary: 112 mg/dL — ABNORMAL HIGH (ref 70–99)

## 2019-08-22 MED ORDER — IPRATROPIUM-ALBUTEROL 0.5-2.5 (3) MG/3ML IN SOLN
3.0000 mL | Freq: Three times a day (TID) | RESPIRATORY_TRACT | Status: DC
  Administered 2019-08-22 – 2019-08-31 (×24): 3 mL via RESPIRATORY_TRACT
  Filled 2019-08-22 (×24): qty 3

## 2019-08-22 MED ORDER — ENSURE ENLIVE PO LIQD
237.0000 mL | Freq: Two times a day (BID) | ORAL | Status: DC
  Administered 2019-08-22 – 2019-08-27 (×7): 237 mL via ORAL

## 2019-08-22 NOTE — Progress Notes (Signed)
Initial Nutrition Assessment  DOCUMENTATION CODES:   Non-severe (moderate) malnutrition in context of chronic illness  INTERVENTION:   - Encourage adequate PO intake  - Ensure Enlive po BID, each supplement provides 350 kcal and 20 grams of protein  - Magic Cup TID with meals, each supplement provides 290 kcal and 9 grams of protein  NUTRITION DIAGNOSIS:   Moderate Malnutrition related to chronic illness (stage IV non-small cell lung cancer) as evidenced by mild fat depletion, moderate fat depletion, mild muscle depletion, moderate muscle depletion.  GOAL:   Patient will meet greater than or equal to 90% of their needs  MONITOR:   PO intake, Supplement acceptance, Labs, Weight trends  REASON FOR ASSESSMENT:   Malnutrition Screening Tool    ASSESSMENT:   68 year old female who presented to the ED on 10/19 with cough and SOB. PMH of COPD and HLD. CT showing obstructing mass at central right lower lobe bronchus with associated postobstructive pneumonitis and right pleural effusion.   10/20 - s/p thoracentesis yielding 700 ml fluid 10/21 - s/p bronchoscopy revealing right lower lobe mass associated with hilar mediastinal adenopathy 10/24 - transferred to ICU due to worsening respiratory status 10/25 - s/p chest tube placement for recurrent malignant pleural effusion 10/26 - first radiation therapy 10/31 - s/p tunneled pleural catheter placement  Pt found to have stage IV non-small cell lung cancer. Pt undergoing 10 radiation treatments which began on 10/26. Per Oncology, pt is not a candidate for systemic chemotherapy at this time.  No meal completions recorded since 10/27.  Spoke with pt and son at bedside. Pt reports that her appetite and PO intake of solid foods have improved over the last several days. Pt states that during the first several days after admission, she was coughing too much to eat or drink anything and that the oxygen was burning her throat which made it  difficult to swallow.  Pt states that once she was able to eat and drink, she was consuming between 2-3 Ensure Enlive supplements daily. Noted pt working on a chocolate Ensure Enlive and ginger ale at time of RD visit. Pt states that now that she is eating more solid food, she thinks she can consume 2 Ensure Enlive supplements daily. RD to order.  Pt's son shares that pt has done well with foods like potato soup, egg salad, and eggs. Pt states that she has tried yogurt and pudding and didn't really care for either of them. Pt states that she just woke up and hasn't had breakfast (around 11:00 am) and requesting from her son that he order [employer] for lunch.  Pt amenable to RD ordering Magic Cups with meals. RD discussed other ways to increase kcal and protein intake. Pt and son aware of how important nutrition is during this time.  Weight history in chart is limited but shows pt has gained weight over the last 11 months. RD will monitor weight trends during admission.  Medications reviewed and include: SSI q 4 hours, Solu-medrol, Protonix, Miralax  Labs reviewed. CBG's: 116-183 x 24 hours  UOP: 250 ml x 24 hours CT: 900 ml x 24 hours  NUTRITION - FOCUSED PHYSICAL EXAM:    Most Recent Value  Orbital Region  Moderate depletion  Upper Arm Region  No depletion  Thoracic and Lumbar Region  Mild depletion  Buccal Region  Mild depletion  Temple Region  Mild depletion  Clavicle Bone Region  Moderate depletion  Clavicle and Acromion Bone Region  Moderate depletion  Scapular Bone Region  Mild depletion  Dorsal Hand  Mild depletion  Patellar Region  Mild depletion  Anterior Thigh Region  Mild depletion  Posterior Calf Region  Mild depletion  Edema (RD Assessment)  Mild [BLE]  Hair  Reviewed  Eyes  Reviewed  Mouth  Reviewed  Skin  Reviewed  Nails  Reviewed       Diet Order:   Diet Order            Diet regular Room service appropriate? Yes; Fluid consistency: Thin  Diet  effective now              EDUCATION NEEDS:   Education needs have been addressed  Skin:  Skin Assessment: Reviewed RN Assessment  Last BM:  08/21/19  Height:   Ht Readings from Last 1 Encounters:  08/07/19 5\' 7"  (1.702 m)    Weight:   Wt Readings from Last 1 Encounters:  08/21/19 73.4 kg    Ideal Body Weight:  61.4 kg  BMI:  Body mass index is 25.34 kg/m.  Estimated Nutritional Needs:   Kcal:  1800-2000  Protein:  90-110 grams  Fluid:  >/= 1.8 L    Gaynell Face, MS, RD, LDN Inpatient Clinical Dietitian Pager: [phone] Weekend/After Hours: [phone]

## 2019-08-22 NOTE — Telephone Encounter (Signed)
I spoke with the patient's son. He had called about his mother's situation and Dr. Lisbeth Renshaw wanted to communicate that our cone beam scans done for her clinical set up are showing promising results so far and clinically it appears that she has less O2 demands. We will follow her course as Dr. Alen Blew anticipates further work up for systemic treatment.

## 2019-08-22 NOTE — Progress Notes (Signed)
OT Cancellation Note  Patient Details Name: Jordan Clay MRN: [id number] DOB: [birth year]   Cancelled Treatment:    Reason Eval/Treat Not Completed: Patient at procedure or test/ unavailable  Pt leaving for radiation. Will check back on pt next day  Kari Baars, [place] [phone] Office- [phone], Thereasa Parkin 08/22/2019, 1:53 PM

## 2019-08-22 NOTE — Progress Notes (Signed)
NAME:  Jordan Clay, MRN:  [id number], DOB:  08-11-1951, LOS: 65 ADMISSION DATE:  08/07/2019, CONSULTATION DATE:  10/20 REFERRING MD:  Reesa Chew (THR) , CHIEF COMPLAINT:  Cough, SOB  Brief History   68 yo female, smoker (40 pack years, quit 1 week PTA when she got pneumonia), with COPD, who presented to [facility] on 10/19 with reports of 2 weeks of cough & SOB.  She was treated for PNA by her PCP.  Work up included a CTA chest which showed obstruction of the central RLL bronchus with associated postobstructive PNA & pleural effusion.  She underwent thoracentesis on 10/20 with pleural fluid consistent with metastatic adenocarcinoma. FOB with EBUS on 10/21 with pathology consistent with non-small cell carcinoma.   Past Medical History   has a past medical history of COPD (chronic obstructive pulmonary disease) ([place]), High cholesterol, and Pneumonia.   Significant Hospital Events   10/19 Admit with post-obstructive PNA 10/20 PCCM consulted 10/20 IR thora  10/21 FOB with EBUS  10/24 Tx to SDU for hypoxic resp failure, cough, SOB 10/29 20L flow / 50% FiO2, 1100 ml out of chest tube 1030 20L flow / 45% FiO2  11/3 oxygen requirement continues to improve, 3 L today  Consults:  PCCM 10/20    Procedures:  Right Chest Tube 10/25 >> 10/31 PleurX 10/31 11/2 Pleurx drain for 900 cc of fluid Significant Diagnostic Tests:  CTA chest 10/19 >> No evidence of pulmonary embolism.  Obstruction of central right lower lobe bronchus and central low-attenuation, suspicious for centrally obstructing mass.  Near complete right lower lobe consolidation, suspicious for postobstructive pneumonitis. Moderate right pleural effusion. New bulky mediastinal and right hilar lymphadenopathy, consistent with metastatic disease.  Stable sub-cm bilateral upper lobe and right middle lobe pulmonary nodules. IR Thoracentesis 10/20 >> 700 cc  No improvement is sob or cough/ cyt pos adenocarcinoma of the lung Bronchoscopy 10/21 >> 90%  obst distal BI/ with endobronchial ultrasound   Cytology R Pleural Fluid 10/20 >> metastatic adenocarcinoma  Cytology 7, FNA 10/21 >> non-small cell carcinoma  Cytology 11 R FNA 10/21 >> non-small cell carcinoma  Cytology RLL brushing 10/21 >> non-small cell, TTF-1 positive   Micro Data:  BC x 2 10/19 >> neg  COVID 10/19 >> neg  Pleural fluid  10/20 >> neg  BAL 10/21 >> neg  Antimicrobials:  Rocephin 10/19 >> 10/24 Azithro 10/19 >> 10/24 Unasyn 10/26 >> 10/31  Interim history/subjective:  Still having issues with fatigue Cough and is better Oxygen requirement improving  Objective   Blood pressure (!) 87/49, pulse (!) 109, temperature 97.6 F (36.4 C), temperature source Oral, resp. rate 18, height _0  (1.702 m), weight 73.4 kg, SpO2 96 %.          Intake/Output Summary (Last 24 hours) at 08/22/2019 0943 Last data filed at 08/21/2019 2123 Gross per 24 hour  Intake 3 ml  Output 1150 ml  Net -1147 ml   Filed Weights   08/07/19 1504 08/21/19 2300  Weight: 70.3 kg 73.4 kg    Examination: GEN: Frail, looks better today HEENT: Moist oral mucosa CV: S1-S2 appreciated, tachycardic PULM: Fair air entry bilaterally, decreased at the right base  GI: Bowel sounds appreciated, soft abdomen EXT: Muscle wasting NEURO: Alert and oriented, PSYCH: Anxious SKIN: Warm and dry  No labs 08/21/2019 chest x-ray reviewed by myself showing a small pneumo, chest tube in place, reduced size of right pleural effusion  Resolved Hospital Problem list     Assessment &  Plan:  .  Acute hypoxemic respiratory failure -Metastatic adenocarcinoma of the lung -Large pleural effusion for which she has a Pleurx catheter in place -Trapped lung -Multifactorial respiratory failure  Stage IV metastatic adenocarcinoma of the lung Loculated effusion Trapped lung -Drain to comfort -Plan is to drain Monday, Wednesday, Friday -Molecular markers pending -Radiation treatment ongoing  Likely  underlying obstructive lung disease -Bronchodilators as needed  Cough -Continue antitussives  Deconditioning -Physical therapy -Encouraged to get more active  Plan to remove sutures 11/9  Sherrilyn Rist, MD [place], PCCM

## 2019-08-23 ENCOUNTER — Ambulatory Visit
Admit: 2019-08-23 | Discharge: 2019-08-23 | Disposition: A | Discharge status: Home / Self Care | Payer: Medicare Other | Attending: Radiation Oncology | Admitting: Radiation Oncology

## 2019-08-23 LAB — GLUCOSE, CAPILLARY
Glucose-Capillary: 100 mg/dL — ABNORMAL HIGH (ref 70–99)
Glucose-Capillary: 106 mg/dL — ABNORMAL HIGH (ref 70–99)
Glucose-Capillary: 112 mg/dL — ABNORMAL HIGH (ref 70–99)
Glucose-Capillary: 115 mg/dL — ABNORMAL HIGH (ref 70–99)
Glucose-Capillary: 123 mg/dL — ABNORMAL HIGH (ref 70–99)
Glucose-Capillary: 133 mg/dL — ABNORMAL HIGH (ref 70–99)
Glucose-Capillary: 211 mg/dL — ABNORMAL HIGH (ref 70–99)

## 2019-08-23 MED ORDER — OXYCODONE HCL 5 MG PO TABS
10.0000 mg | ORAL_TABLET | Freq: Four times a day (QID) | ORAL | Status: DC | PRN
  Administered 2019-08-23: 10 mg via ORAL
  Administered 2019-08-25 (×2): 5 mg via ORAL
  Filled 2019-08-23 (×3): qty 2

## 2019-08-23 MED ORDER — TEMAZEPAM 7.5 MG PO CAPS
7.5000 mg | ORAL_CAPSULE | Freq: Every evening | ORAL | Status: DC | PRN
  Administered 2019-08-23: 7.5 mg via ORAL
  Filled 2019-08-23: qty 1

## 2019-08-23 NOTE — Evaluation (Signed)
Occupational Therapy Evaluation Patient Details Name: Jordan Clay MRN: [id number] DOB: 1951-07-07 Today's Date: 08/23/2019    History of Present Illness 68 yo female admitted with shortness of breath. Imaging (+) R lung mass. New diagnosis of stage IV met lung cancer. S/P thoracotomy. Pleurx catheter 10/31. Hx of COPD, smoker.   Clinical Impression   Pt was admitted for the above. Prior to admission, pt was independent with all ADLs.  Pt fatiques easily and WOB increased with any activity.  Her priority is to be able to get up to chair/BSC, walk to bathroom if she is able and enjoy her grandchildren. She doesn't feel she can nor wants to expend energy on ADLs. Will follow in acute setting, working towards her goals.  Of note, her husband does have some health issues himself--see home information below.     Follow Up Recommendations  Supervision/Assistance - 24 hour(HH aide (vs SNF if she and husband can't manage))    Equipment Recommendations  3 in 1 bedside commode    Recommendations for Other Services       Precautions / Restrictions Precautions Precautions: Fall Precaution Comments: monitor HR, BP, O2 Restrictions Weight Bearing Restrictions: No      Mobility Bed Mobility         Supine to sit: Min assist     General bed mobility comments: hand held assist to get up with [facility] raised  Transfers   Equipment used: 2 person hand held assist   Sit to Stand: Min assist;+2 safety/equipment;+2 physical assistance              Balance                                           ADL either performed or assessed with clinical judgement   ADL Overall ADL's : Needs assistance/impaired Eating/Feeding: Independent   Grooming: Set up                   Toilet Transfer: Minimal assistance;Ambulation;+2 for physical assistance(hand held assist; 3 feet to chair)             General ADL Comments: Pt requires rest breaks between  activities.  Doesn't feel she can do any bathing/dressing and this is not her priority.  Max A overall     Vision         Perception     Praxis      Pertinent Vitals/Pain Pain Assessment: Faces Faces Pain Scale: Hurts a little bit Pain Location: chest Pain Descriptors / Indicators: Discomfort Pain Intervention(s): Limited activity within patient's tolerance;Monitored during session;Repositioned     Hand Dominance     Extremity/Trunk Assessment Upper Extremity Assessment Upper Extremity Assessment: Generalized weakness           Communication Communication Communication: No difficulties   Cognition Arousal/Alertness: Awake/alert Behavior During Therapy: Anxious Overall Cognitive Status: Within Functional Limits for tasks assessed                                 General Comments: tends to like to do things her way   General Comments       Exercises     Shoulder Instructions      Home Living Family/patient expects to be discharged to:: Private residence Living Arrangements: Spouse/significant other Available Help at  Discharge: Family                         Home Equipment: None   Additional Comments: walk in shower upstairs, as is her bathroom; tub/shower downstairs. She sleeps upstairs and husband sleeps downstairs.  Husband has HF and recently dx'd with Parkinson's per pt      Prior Functioning/Environment Level of Independence: Independent                 OT Problem List: Decreased strength;Decreased activity tolerance;Impaired balance (sitting and/or standing);Decreased knowledge of use of DME or AE;Cardiopulmonary status limiting activity;Pain      OT Treatment/Interventions: Self-care/ADL training;Energy conservation;DME and/or AE instruction;Patient/family education;Balance training;Therapeutic activities    OT Goals(Current goals can be found in the care plan section) Acute Rehab OT Goals Patient Stated Goal:  breathe better; be able to sit and enjoy grandchildren OT Goal Formulation: With patient Time For Goal Achievement: 09/06/19 Potential to Achieve Goals: Fair ADL Goals Pt Will Transfer to Toilet: with min guard assist;ambulating;bedside commode Pt Will Perform Toileting - Clothing Manipulation and hygiene: with min assist;sit to/from stand Additional ADL Goal #1: pt will perform 15 minutes of seated light ADL/strengthening activity of choice with 4 rest breaks  OT Frequency: Min 2X/week   Barriers to D/C:            Co-evaluation PT/OT/SLP Co-Evaluation/Treatment: Yes Reason for Co-Treatment: For patient/therapist safety PT goals addressed during session: Mobility/safety with mobility OT goals addressed during session: ADL's and self-care      AM-PAC OT "6 Clicks" Daily Activity     Outcome Measure Help from another person eating meals?: A Little Help from another person taking care of personal grooming?: A Little Help from another person toileting, which includes using toliet, bedpan, or urinal?: A Lot Help from another person bathing (including washing, rinsing, drying)?: A Lot Help from another person to put on and taking off regular upper body clothing?: A Lot Help from another person to put on and taking off regular lower body clothing?: A Lot 6 Click Score: 14   End of Session    Activity Tolerance: Patient limited by fatigue Patient left: in chair;with call bell/phone within reach  OT Visit Diagnosis: Unsteadiness on feet (R26.81);Muscle weakness (generalized) (M62.81)                Time: 4627-0350 OT Time Calculation (min): 38 min Charges:  OT General Charges $OT Visit: 1 Visit  Lesle Chris, OTR/L Acute Rehabilitation Services [phone] [facility] pager [phone] office 08/23/2019  Baya Lentz 08/23/2019, 11:40 AM

## 2019-08-23 NOTE — Progress Notes (Signed)
IP PROGRESS NOTE  Subjective:   Patient reports overall being comfortable sitting in a chair this afternoon.  Her breathing has been stable at this time.  She is eating better and has tolerated more solids food at this time.  He continues to have cough but to a much lesser extent.  No pleural drainage continues to decrease.  Her oxygen needs have also been declining.  Objective:  Vital signs in last 24 hours: Temp:  [97.9 F (36.6 C)-98.1 F (36.7 C)] 98 F (36.7 C) (11/04 0756) Pulse Rate:  [104-118] 104 (11/04 1200) Resp:  [14-28] 14 (11/04 1200) BP: (81-104)/(40-64) 91/51 (11/04 1200) SpO2:  [89 %-100 %] 99 % (11/04 1200) Weight change:  Last BM Date: 08/23/19  Intake/Output from previous day: General: Chronically ill-appearing without distress. Head: Normocephalic atraumatic. Mouth: mucous membranes moist, pharynx normal without lesions Eyes: No scleral icterus.  Pupils are equal and round reactive to light. Resp: Decreased breath sounds on the right side. Cardio: regular rate and rhythm, S1, S2 normal, no murmur, click, rub or gallop GI: soft, non-tender; bowel sounds normal; no masses,  no organomegaly Musculoskeletal: No joint deformity or effusion. Neurological: No motor, sensory deficits.  Intact deep tendon reflexes. Skin: No rashes or lesions.   Medications: I have reviewed the patient's current medications.  Assessment/Plan:  68 year old with:  1.  Stage IV adenocarcinoma of the lung after presenting with a large right lower lung mass.  She has pleural effusion as well as bilateral adenopathy as well as abdominal adenopathy as well.   Molecular testing results reviewed today with the patient and her son and found to have a PD-L1 positive with tumor proportion score of 50%.  No actionable mutation detected on foundation medicine testing.  The ramification of these findings were reviewed today which means that no oral targeted therapy will be indicated at this  time.  The role for systemic chemotherapy with immunotherapy as a potential option to palliate her disease was discussed.  As well as a single agent immunotherapy but also be potentially used.  The duration as well as to schedule of these infusions were also discussed.  Overall, she has showed some improvement but these are options for her to be used for palliation in the future if she continues to recover.  Complication associated with this therapy in general was reviewed.  He is to include nausea, vomiting, myelosuppression as well as immune mediated complications include pneumonitis, colitis among others.   We have discussed the basic improvements that needs to occur before even considering intravenous systemic therapy to treat her cancer.  I would include slight improvement in her nutritional status, improvement in her mobility as well as stabilization of her respiratory status.  If she continues to improve these will be considered as an outpatient basis.  The plan is to continue with the current management and to complete radiation therapy as scheduled.  We will arrange follow-up for her upon discharge to consider systemic treatment upon her discharge.  2.  Respiratory failure: Related to her lung cancer as well as pleural effusion.  Her clinical status appears to be stabilizing.  3.  Disposition and follow-up: We will arrange for follow-up for her upon discharge if she continues to improve as she is doing now.   35  minutes was spent with the patient face-to-face today.  More than 50% of time was spent on reviewing her disease status, treatment options and complications related to therapy.    LOS: 16 days  Zola Button 08/23/2019, 12:58 PM

## 2019-08-23 NOTE — Progress Notes (Signed)
[facility] Therapy Treatment Record Phone [phone]   Radiation Therapy was administered to Jordan Clay on: 08/23/2019  3:08 PM and was treatment # 8 out of a planned course of 10 treatments.  Radiation Treatment  1). Beam photons with 6-10 energy  2). Brachytherapy None  3). Stereotactic Radiosurgery None  4). Other Radiation None     Akeia Perot A Kamera Dubas, RT (T)

## 2019-08-23 NOTE — Progress Notes (Signed)
Physical Therapy Treatment Patient Details Name: Jordan Clay MRN: [id number] DOB: 1951/09/30 Today's Date: 08/23/2019    History of Present Illness 68 yo female admitted with shortness of breath. Imaging (+) R lung mass. New diagnosis of stage IV met lung cancer. S/P thoracotomy. Pleurx catheter 10/31. Hx of COPD, smoker.    PT Comments    The patient did participate in mobilizing to sitting on bed edge with min assist and did take several steps(4') to the recliner. Patient requires frequent rest breaks, has coughing spells. Patient on 4 L [place] and HR up to 100's with exertion. Continue PT for progressive mobility and Dc palnning.     Follow Up Recommendations  Home health PT;Supervision/Assistance - 24 hour     Equipment Recommendations  Rolling walker with 5" wheels;3in1 (PT);Wheelchair (measurements PT);Hospital bed    Recommendations for Other Services       Precautions / Restrictions Precautions Precautions: Fall Precaution Comments: monitor HR, BP, O2 Restrictions Weight Bearing Restrictions: No    Mobility  Bed Mobility Overal bed mobility: Needs Assistance Bed Mobility: Supine to Sit     Supine to sit: Min assist     General bed mobility comments: hand held assist to get up with [facility] raised  Transfers Overall transfer level: Needs assistance Equipment used: 2 person hand held assist Transfers: Sit to/from [facility] Sit to Stand: Min assist;+2 safety/equipment;+2 physical assistance Stand pivot transfers: Min assist;+2 safety/equipment;+2 physical assistance       General transfer comment: x2. Assist to rise, stabilize, control descent. VCs safety, technique, hand placement. Stand piviot, bed to recliner, with RW. O2 sats dropped to 85%. With time and breathing, increased to 91%.  Ambulation/Gait Ambulation/Gait assistance: Mod assist;+2 physical assistance;+2 safety/equipment Gait Distance (Feet): 4 Feet Assistive device: 2 person  hand held assist Gait Pattern/deviations: Step-to pattern     General Gait Details: only a few feet to recliner, moved quickly   Stairs             Wheelchair Mobility    Modified Rankin (Stroke Patients Only)       Balance Overall balance assessment: Needs assistance Sitting-balance support: (P) Bilateral upper extremity supported;Feet supported Sitting balance-Leahy Scale: (P) Fair                                      Cognition Arousal/Alertness: Awake/alert Behavior During Therapy: Anxious Overall Cognitive Status: Within Functional Limits for tasks assessed                               Problem Solving: Requires verbal cues;Requires tactile cues General Comments: tends to like to do things her way      Exercises      General Comments        Pertinent Vitals/Pain Faces Pain Scale: Hurts a little bit Pain Location: chest Pain Descriptors / Indicators: Discomfort Pain Intervention(s): Monitored during session;Limited activity within patient's tolerance;Repositioned    Home Living                      Prior Function            PT Goals (current goals can now be found in the care plan section) Progress towards PT goals: Progressing toward goals    Frequency    Min 3X/week  PT Plan Current plan remains appropriate    Co-evaluation PT/OT/SLP Co-Evaluation/Treatment: Yes Reason for Co-Treatment: For patient/therapist safety PT goals addressed during session: Mobility/safety with mobility OT goals addressed during session: ADL's and self-care      AM-PAC PT "6 Clicks" Mobility   Outcome Measure  Help needed turning from your back to your side while in a flat bed without using bedrails?: A Little Help needed moving from lying on your back to sitting on the side of a flat bed without using bedrails?: A Little Help needed moving to and from a bed to a chair (including a wheelchair)?: A Lot Help  needed standing up from a chair using your arms (e.g., wheelchair or bedside chair)?: A Lot Help needed to walk in hospital room?: A Lot Help needed climbing 3-5 steps with a railing? : Total 6 Click Score: 13    End of Session Equipment Utilized During Treatment: Oxygen Activity Tolerance: Patient tolerated treatment well(requires extra time) Patient left: in chair;with call bell/phone within reach;with family/visitor present Nurse Communication: Mobility status PT Visit Diagnosis: Pain;Muscle weakness (generalized) (M62.81);Difficulty in walking, not elsewhere classified (R26.2)     Time: 3790-2409 PT Time Calculation (min) (ACUTE ONLY): 38 min  Charges:  $Therapeutic Activity: 8-22 mins                     Jordan Clay PT Acute Rehabilitation Services Pager [phone] Office [phone]    Jordan Clay 08/23/2019, 1:32 PM

## 2019-08-23 NOTE — Progress Notes (Signed)
RN assessed pt. This morning pt. Told RN that she will no be taking her steroid anymore because she said it made her very nauseas this morning at 0630. Pt was worried that if she doesn't take that she doesn't know what else to take. RN explained to pt. That this will have to be alerted to MD before the next dose is due tomorrow morning.

## 2019-08-23 NOTE — Progress Notes (Addendum)
NAME:  Jordan Clay, MRN:  [id number], DOB:  02-Jul-1951, LOS: 41 ADMISSION DATE:  08/07/2019, CONSULTATION DATE:  10/20 REFERRING MD:  Reesa Chew (THR) , CHIEF COMPLAINT:  Cough, SOB  Brief History   68 yo female, smoker (40 pack years, quit 1 week PTA when she got pneumonia), with COPD, who presented to [facility] on 10/19 with reports of 2 weeks of cough & SOB.  She was treated for PNA by her PCP.  Work up included a CTA chest which showed obstruction of the central RLL bronchus with associated postobstructive PNA & pleural effusion.  She underwent thoracentesis on 10/20 with pleural fluid consistent with metastatic adenocarcinoma. FOB with EBUS on 10/21 with pathology consistent with non-small cell carcinoma.   Past Medical History   has a past medical history of COPD (chronic obstructive pulmonary disease) ([place]), High cholesterol, and Pneumonia.   Significant Hospital Events   10/19 Admit with post-obstructive PNA 10/20 PCCM consulted 10/20 IR thora  - metastatic adenoca 10/21 FOB with EBUS - NSCLC + 10/21 - Onc consult - Dr Alen Blew -  10/23 - Rad onc consult - ECOG 2. 2 week XRT recommendined startbng 08/14/2019 10/24 Tx to SDU for hypoxic resp failure, cough, SOB 10/25  R chest tube IN 10/27 - palliative consult 10/29 20L flow / 50% FiO2, 1100 ml out of chest tube 1030 20L flow / 45% FiO2  10/31 - R chest tube OUT ->  PleurX  11/2 - Oncology plan ->   If she has actionable mutation that can be used to to target her cancer, disease might be able to be palliated with systemic oral therapy.  Overall she is not candidate for systemic chemotherapy at this time. 11/3 oxygen requirement continues to improve, 3 L today - Still having issues with fatigue Cough and is better Oxygen requirement improving  Consults:  PCCM 10/20    Procedures:  Right Chest Tube 10/25 >> 10/31 PleurX 10/31 11/2 Pleurx drain for 900 cc of fluid Significant Diagnostic Tests:  CTA chest 10/19 >> No evidence of  pulmonary embolism.  Obstruction of central right lower lobe bronchus and central low-attenuation, suspicious for centrally obstructing mass.  Near complete right lower lobe consolidation, suspicious for postobstructive pneumonitis. Moderate right pleural effusion. New bulky mediastinal and right hilar lymphadenopathy, consistent with metastatic disease.  Stable sub-cm bilateral upper lobe and right middle lobe pulmonary nodules. IR Thoracentesis 10/20 >> 700 cc  No improvement is sob or cough/ cyt pos adenocarcinoma of the lung Bronchoscopy 10/21 >> 90% obst distal BI/ with endobronchial ultrasound   Cytology R Pleural Fluid 10/20 >> metastatic adenocarcinoma  Cytology 7, FNA 10/21 >> non-small cell carcinoma  Cytology 11 R FNA 10/21 >> non-small cell carcinoma  Cytology RLL brushing 10/21 >> non-small cell, TTF-1 positive   Micro Data:  BC x 2 10/19 >> neg  COVID 10/19 >> neg  Pleural fluid  10/20 >> neg  BAL 10/21 >> neg  Antimicrobials:  Rocephin 10/19 >> 10/24 Azithro 10/19 >> 10/24 Unasyn 10/26 >> 10/31  Interim history/subjective:   11/4 - rad onc communicated to family that patient responding to XRT . Lot of somatic complaints  - nausea after solumderol today - on scheduled oxy and she is asking for prn.   - poor cough control - c/o dyspnea - also lot of fatigue - c/o insomnia despite ambien and rozererm - wants something else =- Per son and patient -> d7 of XRT completed  Speaks Hebrew as well.  Says baseline was ECOG 0 prior to admi  Objective   Blood pressure (!) 90/53, pulse (!) 113, temperature 98 F (36.7 C), temperature source Oral, resp. rate (!) 25, height _0  (1.702 m), weight 73.4 kg, SpO2 97 %.         No intake or output data in the 24 hours ending 08/23/19 1041 Filed Weights   08/07/19 1504 08/21/19 2300  Weight: 70.3 kg 73.4 kg    General Appearance:  Looks deconditioned Head:  Normocephalic, without obvious abnormality, atraumatic Eyes:  PERRL -  tes, conjunctiva/corneas - muddy     Ears:  Normal external ear canals, both ears Nose:  G tube - no but has [place] Throat:  ETT TUBE - no , OG tube - no Neck:  Supple,  No enlargement/tenderness/nodules Lungs: Clear to auscultation bilaterally, but significantly reduced Rt baese with Pleurx right and stonly dullness Heart:  S1 and S2 normal, no murmur, CVP - no.  Pressors - no Abdomen:  Soft, no masses, no organomegaly Genitalia / Rectal:  Not done Extremities:  Extremities- intact Skin:  ntact in exposed areas . Sacral area - not examined Neurologic:  Sedation - none -> RASS - +1 . Moves all 4s - yes. CAM-ICU - n3g . 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 Refused morphine. Declines scheduled oxycidone  Plan - continue gabapentin - ? Being given for cough (not a home med) -Continue antitussives - tussionex  Insomnia  - change ambien to restoroil - cintinue rozerem   Deconditioning -Physical therapy -Encouraged to get more active   GOALS  - get stronger and get home and ultimately see if chemo eligible  Palliative care available prn - have Asked Dr Domingo Cocking to perioidically review for symptom management and goals/progress  PCCM wil be  available prn for pleurx related questions TRH primary from 08/24/19 - d/w Dr Tyrell Antonio Move to SDU 08/23/2019 and considedr floor 08/24/19     SIGNATURE    Dr. Brand Males, M.D., F.C.C.P,  Pulmonary and Critical Care Medicine Staff Physician, [place] Director - Interstitial Lung Disease  Program  Pulmonary [place] [facility] [place], [place], [address]  Pager: [phone], If no answer or between  15:00h - 7:00h: call [phone] Telephone: [phone]  10:41 AM 08/23/2019   LABS for reference    PULMONARY No results for input(s): PHART, PCO2ART, PO2ART, HCO3, TCO2, O2SAT in the last 168 hours.  Invalid input(s): PCO2, PO2  CBC Recent Labs  Lab 08/19/19 0241  HGB 12.7  HCT 39.9  WBC 20.5*  PLT 301    COAGULATION No results for input(s): INR in the last 168 hours.  CARDIAC  No results for input(s): TROPONINI in the last 168 hours. No results for input(s): PROBNP in the last 168 hours.   CHEMISTRY Recent Labs  Lab 08/19/19 0241  NA 136  K 4.0  CL 102  CO2 24  GLUCOSE 122*  BUN 20  CREATININE 0.65  CALCIUM 7.6*   Estimated Creatinine Clearance: 66.4 mL/min (by C-G formula based on SCr of 0.65 mg/dL).   LIVER No results for input(s): AST, ALT, ALKPHOS, BILITOT, PROT, ALBUMIN, INR in the last 168 hours.   INFECTIOUS No results for input(s): LATICACIDVEN, PROCALCITON in the last 168 hours.   ENDOCRINE CBG (last 3)  Recent Labs    08/23/19 0022 08/23/19 0336 08/23/19 0756  GLUCAP 106* 123* 133*         IMAGING x48h  - image(s) personally visualized  -   highlighted in bold No results found.

## 2019-08-24 ENCOUNTER — Ambulatory Visit
Admit: 2019-08-24 | Discharge: 2019-08-24 | Disposition: A | Discharge status: Home / Self Care | Payer: Medicare Other | Attending: Radiation Oncology | Admitting: Radiation Oncology

## 2019-08-24 DIAGNOSIS — E440 Moderate protein-calorie malnutrition: Secondary | ICD-10-CM | POA: Insufficient documentation

## 2019-08-24 LAB — BASIC METABOLIC PANEL
Anion gap: 12 (ref 5–15)
BUN: 17 mg/dL (ref 8–23)
CO2: 24 mmol/L (ref 22–32)
Calcium: 8.2 mg/dL — ABNORMAL LOW (ref 8.9–10.3)
Chloride: 99 mmol/L (ref 98–111)
Creatinine, Ser: 0.75 mg/dL (ref 0.44–1.00)
GFR calc Af Amer: >60 mL/min (ref >60)
GFR calc non Af Amer: >60 mL/min (ref >60)
Glucose, Bld: 139 mg/dL — ABNORMAL HIGH (ref 70–99)
Potassium: 3.8 mmol/L (ref 3.5–5.1)
Sodium: 135 mmol/L (ref 135–145)

## 2019-08-24 LAB — GLUCOSE, CAPILLARY
Glucose-Capillary: 103 mg/dL — ABNORMAL HIGH (ref 70–99)
Glucose-Capillary: 119 mg/dL — ABNORMAL HIGH (ref 70–99)
Glucose-Capillary: 124 mg/dL — ABNORMAL HIGH (ref 70–99)
Glucose-Capillary: 141 mg/dL — ABNORMAL HIGH (ref 70–99)
Glucose-Capillary: 97 mg/dL (ref 70–99)

## 2019-08-24 LAB — CBC
HCT: 42.4 % (ref 36.0–46.0)
Hemoglobin: 13.4 g/dL (ref 12.0–15.0)
MCH: 29.6 pg (ref 26.0–34.0)
MCHC: 31.6 g/dL (ref 30.0–36.0)
MCV: 93.8 fL (ref 80.0–100.0)
Platelets: 281 10*3/uL (ref 150–400)
RBC: 4.52 MIL/uL (ref 3.87–5.11)
RDW: 13 % (ref 11.5–15.5)
WBC: 30.2 10*3/uL — ABNORMAL HIGH (ref 4.0–10.5)
nRBC: 0.1 % (ref 0.0–0.2)

## 2019-08-24 MED ORDER — HYDROCORTISONE 1 % EX CREA
TOPICAL_CREAM | Freq: Two times a day (BID) | CUTANEOUS | Status: DC
  Administered 2019-08-24: 22:00:00 via TOPICAL
  Administered 2019-08-25: 1 via TOPICAL
  Administered 2019-08-25: 22:00:00 via TOPICAL
  Administered 2019-08-26: 1 via TOPICAL
  Administered 2019-08-27 – 2019-08-28 (×4): via TOPICAL
  Filled 2019-08-24 (×2): qty 28

## 2019-08-24 MED ORDER — ALBUMIN HUMAN 25 % IV SOLN
12.5000 g | Freq: Once | INTRAVENOUS | Status: AC
  Administered 2019-08-24: 12.5 g via INTRAVENOUS

## 2019-08-24 MED ORDER — BENZONATATE 100 MG PO CAPS
200.0000 mg | ORAL_CAPSULE | Freq: Three times a day (TID) | ORAL | Status: DC
  Administered 2019-08-25 – 2019-08-29 (×11): 200 mg via ORAL
  Filled 2019-08-24 (×14): qty 2

## 2019-08-24 MED ORDER — BENZONATATE 100 MG PO CAPS
200.0000 mg | ORAL_CAPSULE | Freq: Three times a day (TID) | ORAL | Status: DC | PRN
  Administered 2019-08-24: 200 mg via ORAL
  Filled 2019-08-24: qty 2

## 2019-08-24 MED ORDER — LACTATED RINGERS IV BOLUS
500.0000 mL | Freq: Once | INTRAVENOUS | Status: AC
  Administered 2019-08-24: 500 mL via INTRAVENOUS

## 2019-08-24 MED ORDER — LACTATED RINGERS IV SOLN
INTRAVENOUS | Status: DC
  Administered 2019-08-24: 11:00:00 via INTRAVENOUS

## 2019-08-24 NOTE — Progress Notes (Addendum)
PROGRESS NOTE    Jordan Clay  FXJ:[id number]  DOB: 1951-04-11  DOA: 08/07/2019 PCP: Hulan Fess, MD  Brief Narrative:  68 yo female with 40 pack year smoking history,COPD who was recently treated for pneumonia by her PCP a week prior to admission,hyperlipidemia presented to [facility] on 10/19 with reports of 2 weeks of cough & SOB. CTA chest revealed obstruction of the central RLL bronchus with associated postobstructive PNA & pleural effusion. She underwent thoracentesis on 10/20 by IR with pleural fluid consistent with metastatic adenocarcinoma. PCCM consulted and patient underwent FOB with EBUS on 10/21 with pathology consistent with non-small cell carcinoma. Oncology, Dr Alen Blew, was consulted - not felt to be a candidate for systemic chemotherapy currently-subsequently rad-onc involved with plan for 2 week XRT starting 08/14/19. Patient however transferred to SDU for hypoxic resp failure on 10/24 requiring right sided chest tube placement--subsequently converted to pleurx catheter. Patient was requiring 20 lits High flow 02 until last week but improving now and down to 3lits [place] on 11/3. Cough is improving. Patient received abx: Rocephin/Azithro 10/19 >> 10/24 and Unasyn 10/26 >> 10/31. COVID -ve, blood cx and pleural fluid/BAL cx -ve so far. Patient completed 7/14 radiation treatments. Patient has had systemic symptoms with nausea etc but overall Rad onc communicated to family that patient responding to XRT on 11/4. Transferred to hospitalist service on 11/5.  Subjective:  Patient resting comfortably other than persistent dry cough, pleuritic associated with posttussive respiratory discomfort.  Denies any chest pain or dizziness.  Pleurx catheter in place on the right side.  Her husband and 2 sons are at bedside and she has been conversing with them/mentating well.  Blood pressure early this morning low at systolic 64B and patient received 500 mL IV bolus.  She is currently on maintenance fluids.   Patient states she runs blood pressure around systolic 58X to low 09M even at baseline.   Objective: Vitals:   08/24/19 1400 08/24/19 1500 08/24/19 1600 08/24/19 1700  BP: (!) 110/57 (!) 85/49 (!) 93/54 (!) 104/53  Pulse: (!) 121  (!) 123 (!) 122  Resp: (!) 29  (!) 24 (!) 33  Temp:    98 F (36.7 C)  TempSrc:    Oral  SpO2: 92%  90% 91%  Weight:      Height:        Intake/Output Summary (Last 24 hours) at 08/24/2019 1730 Last data filed at 08/24/2019 1718 Gross per 24 hour  Intake 1081.05 ml  Output 650 ml  Net 431.05 ml   Filed Weights   08/07/19 1504 08/21/19 2300  Weight: 70.3 kg 73.4 kg    Physical Examination:  General exam: Appears calm and comfortable other than coughing spells Respiratory system: Reduced breath sounds at right base.  Respiratory effort normal between coughing spells Cardiovascular system: S1 & S2 heard, tachycardic, RRR. No JVD, murmurs, rubs, gallops or clicks. No pedal edema. Gastrointestinal system: Abdomen is nondistended, soft and nontender. No organomegaly or masses felt. Normal bowel sounds heard. Central nervous system: Alert and oriented. No focal neurological deficits. Extremities: Symmetric 5 x 5 power. Skin: Patient noted to have macular rash along right scapular area extending to right axilla (she reports allergy to local dressing/tape) she also has several pressure dressings on mid/lower back at the site of previous skin break/allergy Psychiatry: Judgement and insight appear normal. Mood & affect appropriate.     Data Reviewed: I have personally reviewed following labs and imaging studies  CBC: Recent Labs  Lab  08/19/19 0241  WBC 20.5*  HGB 12.7  HCT 39.9  MCV 93.7  PLT 500   Basic Metabolic Panel: Recent Labs  Lab 08/19/19 0241  NA 136  K 4.0  CL 102  CO2 24  GLUCOSE 122*  BUN 20  CREATININE 0.65  CALCIUM 7.6*   GFR: Estimated Creatinine Clearance: 66.4 mL/min (by C-G formula based on SCr of 0.65 mg/dL). Liver  Function Tests: No results for input(s): AST, ALT, ALKPHOS, BILITOT, PROT, ALBUMIN in the last 168 hours. No results for input(s): LIPASE, AMYLASE in the last 168 hours. No results for input(s): AMMONIA in the last 168 hours. Coagulation Profile: No results for input(s): INR, PROTIME in the last 168 hours. Cardiac Enzymes: No results for input(s): CKTOTAL, CKMB, CKMBINDEX, TROPONINI in the last 168 hours. BNP (last 3 results) No results for input(s): PROBNP in the last 8760 hours. HbA1C: No results for input(s): HGBA1C in the last 72 hours. CBG: Recent Labs  Lab 08/23/19 2256 08/24/19 0406 08/24/19 0808 08/24/19 1246 08/24/19 1651  GLUCAP 112* 103* 119* 124* 141*   Lipid Profile: No results for input(s): CHOL, HDL, LDLCALC, TRIG, CHOLHDL, LDLDIRECT in the last 72 hours. Thyroid Function Tests: No results for input(s): TSH, T4TOTAL, FREET4, T3FREE, THYROIDAB in the last 72 hours. Anemia Panel: No results for input(s): VITAMINB12, FOLATE, FERRITIN, TIBC, IRON, RETICCTPCT in the last 72 hours. Sepsis Labs: No results for input(s): PROCALCITON, LATICACIDVEN in the last 168 hours.  No results found for this or any previous visit (from the past 240 hour(s)).    Radiology Studies: No results found.      Scheduled Meds:  Chlorhexidine Gluconate Cloth  6 each Topical Daily   chlorpheniramine-HYDROcodone  5 mL Oral Q6H   enoxaparin (LOVENOX) injection  40 mg Subcutaneous Q24H   feeding supplement (ENSURE ENLIVE)  237 mL Oral BID BM   gabapentin  100 mg Oral TID   hydrocortisone cream   Topical BID   insulin aspart  0-9 Units Subcutaneous Q4H   ipratropium-albuterol  3 mL Nebulization TID   mouth rinse  15 mL Mouth Rinse BID   methylPREDNISolone (SOLU-MEDROL) injection  40 mg Intravenous Q24H   pantoprazole  40 mg Oral BID AC   polyethylene glycol  17 g Oral BID   ramelteon  8 mg Oral QHS   sodium chloride flush  3 mL Intravenous Q12H   Continuous  Infusions:  lactated ringers 75 mL/hr at 08/24/19 1718    Assessment & Plan:    1.Acute hypoxic respiratory failure secondary to lung malignancy/obstructed airway/trapped lung/?  Postobstructive pneumonia: CTA chest revealed obstruction of the central RLL bronchus with associated postobstructive PNA & pleural effusion. She underwent thoracentesis on 10/20 by IR with pleural fluid consistent with metastatic adenocarcinoma. PCCM consulted and patient underwent FOB with EBUS on 10/21 with pathology consistent with non-small cell carcinoma.  Now has right Pleurx catheter in place--pulmonary to follow-up in a.m. and advise if patient can have Pleurx drainage more frequently.  Patient received Rocephin/Azithro 10/19 >> 10/24 and Unasyn 10/26 >> 10/31. COVID -ve, blood cx and pleural fluid/BAL cx -ve so far. Patient completed 7/14 radiation treatments.  Now saturating well on 3 L nasal cannula.  Patient does not have any labs done since 10/31.  Repeat CBC/BMP today and in a.m.  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  Not a candidate for chemotherapy.  Poor prognosis overall.  Family appears to  be supportive.  Patient also evaluated by palliative care on 10/27.  DNR  3. Refractory cough: Likely secondary to pleurisy/pleural irritation and problem #2.  Patient on gabapentin and Tussionex scheduled dosing.  Will add Tessalon as needed.  Cough appears to be mostly dry.  Can add guaifenesin if patient experiences trouble bringing up phlegm.  Per PCCM, might have underlying obstructive lung disease.  Patient noted to be on scheduled duo nebs and albuterol nebulizer as needed -continue the same to help with both cough and posttussive bronchospasm.   4.  Acute on chronic hypotension: Patient had drop in systolic blood pressure to 70s this morning and diastolic into 46O.  She states her baseline blood pressure is around  systolic 03O to 12Y and diastolic in the 48G.  Blood pressure improved with 500 mL LR bolus and albumin infusion.  Patient was started on maintenance fluids LR at 100/h but reduce now to 50/h given concern for worsening pleural effusion.  PCCM to follow-up and recommend if Pleurx catheter can be drained more frequently although concerned that this may worsen hypotension due to third spacing.  5. Hyperlipidemia: May not need meds anymore.  Will repeat lipid profile in a.m.  6. Insomnia-PCCM changeD ambien to restoroil- continue rozerem  7.  Urinary retention: Patient noted to have urinary retention of 450 mL.  Straight cath x1.  If has recurrence will place indwelling Foley.  8. Skin rashes: Per bedside nurse, who knows patient from last week, patient skin rashes were much worse from paper tapes that were used at procedure sites hence pressure dressings were placed.  According to nurse current scapular/axillary rash much better than before.  Will try local hydrocortisone cream.  Nurse also reported noting rash on inner thigh avoid placing Foley, nonitching.  9.  Prolonged QT interval: Last EKG from 10/22 shows improvement of QT interval to 435 ms   DVT prophylaxis: On Lovenox Code Status: DNR Family / Patient Communication: Discussed with husband and sons bedside Disposition Plan: TBD.  Palliative care following     LOS: 17 days    Time spent:     Guilford Shi, MD [facility] Pager [phone]  If 7PM-7AM, please contact night-coverage [email or web address] Password [email or web address] 08/24/2019, 5:30 PM

## 2019-08-25 ENCOUNTER — Ambulatory Visit
Admit: 2019-08-25 | Discharge: 2019-08-25 | Disposition: A | Discharge status: Home / Self Care | Payer: Medicare Other | Attending: Radiation Oncology | Admitting: Radiation Oncology

## 2019-08-25 ENCOUNTER — Encounter: Payer: Self-pay | Admitting: Radiation Oncology

## 2019-08-25 ENCOUNTER — Inpatient Hospital Stay (HOSPITAL_COMMUNITY): Payer: Medicare Other

## 2019-08-25 LAB — GLUCOSE, CAPILLARY
Glucose-Capillary: 113 mg/dL — ABNORMAL HIGH (ref 70–99)
Glucose-Capillary: 123 mg/dL — ABNORMAL HIGH (ref 70–99)
Glucose-Capillary: 140 mg/dL — ABNORMAL HIGH (ref 70–99)
Glucose-Capillary: 161 mg/dL — ABNORMAL HIGH (ref 70–99)
Glucose-Capillary: 142 mg/dL — ABNORMAL HIGH (ref 70–99)
Glucose-Capillary: 120 mg/dL — ABNORMAL HIGH (ref 70–99)
Glucose-Capillary: 124 mg/dL — ABNORMAL HIGH (ref 70–99)

## 2019-08-25 LAB — BASIC METABOLIC PANEL
Anion gap: 14 (ref 5–15)
BUN: 13 mg/dL (ref 8–23)
CO2: 24 mmol/L (ref 22–32)
Calcium: 8.1 mg/dL — ABNORMAL LOW (ref 8.9–10.3)
Chloride: 100 mmol/L (ref 98–111)
Creatinine, Ser: 0.68 mg/dL (ref 0.44–1.00)
GFR calc Af Amer: >60 mL/min (ref >60)
GFR calc non Af Amer: >60 mL/min (ref >60)
Glucose, Bld: 120 mg/dL — ABNORMAL HIGH (ref 70–99)
Potassium: 3.7 mmol/L (ref 3.5–5.1)
Sodium: 138 mmol/L (ref 135–145)

## 2019-08-25 LAB — CBC
HCT: 44.7 % (ref 36.0–46.0)
Hemoglobin: 13.9 g/dL (ref 12.0–15.0)
MCH: 29.4 pg (ref 26.0–34.0)
MCHC: 31.1 g/dL (ref 30.0–36.0)
MCV: 94.5 fL (ref 80.0–100.0)
Platelets: 261 10*3/uL (ref 150–400)
RBC: 4.73 MIL/uL (ref 3.87–5.11)
RDW: 13 % (ref 11.5–15.5)
WBC: 31.6 10*3/uL — ABNORMAL HIGH (ref 4.0–10.5)
nRBC: 0.1 % (ref 0.0–0.2)

## 2019-08-25 MED ORDER — OXYCODONE HCL 5 MG PO TABS: 5.0000 mg | ORAL_TABLET | Freq: Four times a day (QID) | ORAL | Status: DC | PRN

## 2019-08-25 MED ORDER — HYDROXYZINE HCL 25 MG PO TABS
25.0000 mg | ORAL_TABLET | Freq: Three times a day (TID) | ORAL | Status: DC | PRN
  Filled 2019-08-25: qty 1

## 2019-08-25 NOTE — Progress Notes (Signed)
Pt SpO2 low, placed Pt back on salter HFNC at 7Lpm and reminded Pt to breath in through her nose.  Pt SpO2 up to 91%.

## 2019-08-25 NOTE — Progress Notes (Signed)
Daily Progress Note   Patient Name: Jordan Clay       Date: 08/25/2019 DOB: 1951-02-26  Age: 68 y.o. MRN#: [id number] Attending Physician: Guilford Shi, MD Primary Care Physician: Hulan Fess, MD Admit Date: 08/07/2019  Reason for Consultation/Follow-up: Establishing goals of care and symptom management (cough)  Subjective: Patient is awake alert resting in bed. +coughing spells but she reports that these are better than they have been. Discussed her cough and trial of multiple medications for cough.  Currently getting tussionex and gabapentin and thinks this has been best regimen so far.  I talked with her about restarting of benzonatate prn by primary attending today.  She reports talking this previously and did not feel it was that beneficial.  Discussed using it as an adjunct to her current tussionex and she reports understanding and agreeable.      Length of Stay: 18  Current Medications: Scheduled Meds:  . benzonatate  200 mg Oral TID  . Chlorhexidine Gluconate Cloth  6 each Topical Daily  . chlorpheniramine-HYDROcodone  5 mL Oral Q6H  . enoxaparin (LOVENOX) injection  40 mg Subcutaneous Q24H  . feeding supplement (ENSURE ENLIVE)  237 mL Oral BID BM  . gabapentin  100 mg Oral TID  . hydrocortisone cream   Topical BID  . insulin aspart  0-9 Units Subcutaneous Q4H  . ipratropium-albuterol  3 mL Nebulization TID  . mouth rinse  15 mL Mouth Rinse BID  . methylPREDNISolone (SOLU-MEDROL) injection  40 mg Intravenous Q24H  . pantoprazole  40 mg Oral BID AC  . polyethylene glycol  17 g Oral BID  . ramelteon  8 mg Oral QHS  . sodium chloride flush  3 mL Intravenous Q12H    Continuous Infusions:   PRN Meds: acetaminophen **OR** acetaminophen, albuterol, bisacodyl,  menthol-cetylpyridinium, menthol-cetylpyridinium, morphine injection, oxyCODONE, phenol, senna-docusate, temazepam  Physical Exam         Awake alert Diminished breath sounds R Abdomen is not tender No edema Has muscle wasting On 20L and 55%, still with high O2 requirements.  Vital Signs: BP 90/61   Pulse (!) 116   Temp 98 F (36.7 C) (Oral)   Resp 17   Ht 5\' 7"  (1.702 m)   Wt 73.4 kg   SpO2 (!) 89%   BMI 25.34 kg/m  SpO2:  SpO2: (!) 89 % O2 Device: O2 Device: High Flow Nasal Cannula O2 Flow Rate: O2 Flow Rate (L/min): 7 L/min  Intake/output summary:   Intake/Output Summary (Last 24 hours) at 08/25/2019 0906 Last data filed at 08/25/2019 7564 Gross per 24 hour  Intake 2048.66 ml  Output 500 ml  Net 1548.66 ml   LBM: Last BM Date: 08/23/19 Baseline Weight: Weight: 70.3 kg Most recent weight: Weight: 73.4 kg       Palliative Assessment/Data:    Flowsheet Rows     Most Recent Value  Intake Tab  Referral Department  Critical care  Unit at Time of Referral  ICU  Palliative Care Primary Diagnosis  Cancer  Date Notified  08/14/19  Palliative Care Type  New Palliative care  Reason for referral  Clarify Goals of Care  Date of Admission  08/07/19  Date first seen by Palliative Care  08/15/19  # of days Palliative referral response time  1 Day(s)  # of days IP prior to Palliative referral  7  Clinical Assessment  Psychosocial & Spiritual Assessment  Palliative Care Outcomes      Patient Active Problem List   Diagnosis Date Noted  . Malnutrition of moderate degree 08/24/2019  . Stage IV adenocarcinoma of lung, right ([place]) 08/14/2019  . Acute respiratory failure with hypoxemia ([place]) 08/12/2019  . SOB (shortness of breath)   . Postobstructive pneumonia 08/07/2019  . Pleural effusion on right 08/07/2019  . Prolonged QT interval 08/07/2019  . Mass of right lung   . Hyperlipidemia 09/27/2018  . Tobacco abuse 09/27/2018  . COPD (chronic obstructive pulmonary  disease) ([place]) 09/27/2018  . Palpitations 09/27/2018    Palliative Care Assessment & Plan   Patient Profile:  Acute hypoxemic respiratory failure secondary to metastatic lung adenocarcinoma, severe deconditioning, emphysema, malignant right effusion and relative protein calorie malnutrition   Assessment:  frailty deconditioning Shortness of breath Anxiety   Recommendations/Plan: - Reports tussionex has been most helpful agent for cough.  Note where she was restarted on benzonatate prn today as well.  I agree with this addition.  She reports being on the benzonatate warlier this admit with no real improvement, but I discussed with her that I have seen some people respond well with benzonatate as an adjunct if they are receiving regular opioids as well.  She was in agreement with trying this as a scheduled medication (rather than prn) for the next 24-48 hours to see if any benefit from this combination.  If not, we can certainly stop or change back to prn. - Patient wishes to continue with current mode of care, she is in full understanding of the severity of her illness.   Code Status:    Code Status Orders  (From admission, onward)         Start     Ordered   08/15/19 0942  Do not attempt resuscitation (DNR)  Continuous    Question Answer Comment  In the event of cardiac or respiratory ARREST Do not call a "code blue"   In the event of cardiac or respiratory ARREST Do not perform Intubation, CPR, defibrillation or ACLS   In the event of cardiac or respiratory ARREST Use medication by any route, position, wound care, and other measures to relive pain and suffering. May use oxygen, suction and manual treatment of airway obstruction as needed for comfort.      08/15/19 0941        Code Status History    Date Active  Date Inactive Code Status Order ID Comments User Context   08/07/2019 2044 08/15/2019 0941 Full Code [id number]  Vianne Bulls, MD Inpatient   Advance Care Planning  Activity    Advance Directive Documentation     Most Recent Value  Type of Advance Directive  Living will  Pre-existing out of facility DNR order (yellow form or pink MOST form)  -  "MOST" Form in Place?  -       Prognosis:   guarded   Discharge Planning:  To Be Determined  Care plan was discussed with  Patient and bedside RN.   Thank you for allowing the Palliative Medicine Team to assist in the care of this patient.   Time In: 1530 Time Out: 1800 Total Time 30 Prolonged Time Billed  no       Greater than 50%  of this time was spent counseling and coordinating care related to the above assessment and plan.  Micheline Rough, MD  Please contact Palliative Medicine Team phone at [phone] for questions and concerns.

## 2019-08-25 NOTE — Progress Notes (Signed)
NAME:  Jordan Clay, MRN:  [id number], DOB:  [birth year], LOS: 17 ADMISSION DATE:  08/07/2019, CONSULTATION DATE:  10/20 REFERRING MD:  Reesa Chew (THR) , CHIEF COMPLAINT:  Cough, SOB  Brief History   68 yo female, smoker (40 pack years, quit 1 week PTA when she got pneumonia), with COPD, who presented to [facility] on 10/19 with reports of 2 weeks of cough & SOB.  She was treated for PNA by her PCP.  Work up included a CTA chest which showed obstruction of the central RLL bronchus with associated postobstructive PNA & pleural effusion.  She underwent thoracentesis on 10/20 with pleural fluid consistent with metastatic adenocarcinoma. FOB with EBUS on 10/21 with pathology consistent with non-small cell carcinoma.   Past Medical History   has a past medical history of COPD (chronic obstructive pulmonary disease) ([place]), High cholesterol, and Pneumonia.   Significant Hospital Events   10/19 Admit with post-obstructive PNA 10/20 PCCM consulted 10/20 IR thora  - metastatic adenoca 10/21 FOB with EBUS - NSCLC + 10/21 - Onc consult - Dr Alen Blew -  10/23 - Rad onc consult - ECOG 2. 2 week XRT recommendined startbng 08/14/2019 10/24 Tx to SDU for hypoxic resp failure, cough, SOB 10/25  R chest tube IN 10/27 - palliative consult 10/29 20L flow / 50% FiO2, 1100 ml out of chest tube 1030 20L flow / 45% FiO2  10/31 - R chest tube OUT ->  PleurX  11/2 - Oncology plan ->   If she has actionable mutation that can be used to to target her cancer, disease might be able to be palliated with systemic oral therapy.  Overall she is not candidate for systemic chemotherapy at this time. 11/3 oxygen requirement continues to improve, 3 L today - Still having issues with fatigue Cough and is better Oxygen requirement improving 11/6: Increased pleural effusion.  Drained 500 mL, similar to last volume removed Consults:  PCCM 10/20    Procedures:  Right Chest Tube 10/25 >> 10/31 PleurX 10/31 11/2 Pleurx drain for 900 cc  of fluid Significant Diagnostic Tests:  CTA chest 10/19 >> No evidence of pulmonary embolism.  Obstruction of central right lower lobe bronchus and central low-attenuation, suspicious for centrally obstructing mass.  Near complete right lower lobe consolidation, suspicious for postobstructive pneumonitis. Moderate right pleural effusion. New bulky mediastinal and right hilar lymphadenopathy, consistent with metastatic disease.  Stable sub-cm bilateral upper lobe and right middle lobe pulmonary nodules. IR Thoracentesis 10/20 >> 700 cc  No improvement is sob or cough/ cyt pos adenocarcinoma of the lung Bronchoscopy 10/21 >> 90% obst distal BI/ with endobronchial ultrasound   Cytology R Pleural Fluid 10/20 >> metastatic adenocarcinoma  Cytology 7, FNA 10/21 >> non-small cell carcinoma  Cytology 11 R FNA 10/21 >> non-small cell carcinoma  Cytology RLL brushing 10/21 >> non-small cell, TTF-1 positive   Micro Data:  BC x 2 10/19 >> neg  COVID 10/19 >> neg  Pleural fluid  10/20 >> neg  BAL 10/21 >> neg  Antimicrobials:  Rocephin 10/19 >> 10/24 Azithro 10/19 >> 10/24 Unasyn 10/26 >> 10/31  Interim history/subjective:   Still quite short of breath  Objective   Blood pressure 90/61, pulse (Abnormal) 116, temperature 98 F (36.7 C), temperature source Oral, resp. rate 17, height _0  (1.702 m), weight 73.4 kg, SpO2 (Abnormal) 89 %.          Intake/Output Summary (Last 24 hours) at 08/25/2019 1010 Last data filed at 08/25/2019 2263 Gross  per 24 hour  Intake 2048.66 ml  Output 500 ml  Net 1548.66 ml   Filed Weights   08/07/19 1504 08/21/19 2300  Weight: 70.3 kg 73.4 kg    General 68 year old female patient is currently resting in bed still has intermittent cough with significant associated shortness of breath HEENT normocephalic atraumatic no jugular venous distention Pulmonary: Some scattered rhonchi, diminished on right side no accessory use Cardiac: Regular rate and rhythm  Abdomen: Soft nontender Extremities: Warm and dry Neuro: Awake oriented anxious.  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  Trapped lung Likely underlying obstructive lung disease Cough Insomnia Deconditioning   Interval Pleural effusion increased in interval over the last several days with worsening oxygen requirements.  Cough persists without much improvement.  We again drained about 550 mL from the pleural space with some improvement in work of breathing but continues to have cough and hypoxia.  Plan Continue supplemental oxygen Continue cough suppression with current regimen including Tussionex and Tessalon Perles as well as Neurontin I will add Protonix We will continue to plan Monday Wednesday Friday drainage of the pleural space, volume as tolerated.  She seems to be settling into a volume around 500 mL.  We can however drain as needed as well.  I do not know that we have completely settled in on what her optimal draining schedule should be. I have told her if her shortness of breath gets worse again over the weekend and we can drain earlier, perhaps even drain Sunday instead of Monday. Repeat chest x-ray as needed Continue radiation therapy as outlined by radiation oncology Continue IV steroids at current dosing for now Plan for systemic chemotherapy still to be determined however I am not convinced her functional status will allow this   Erick Colace ACNP-BC [place] Pager # [phone] OR # [phone] if no answer

## 2019-08-25 NOTE — TOC Initial Note (Signed)
Transition of Care (TOC) - Initial/Assessment Note    Patient Details  Name: Jordan Clay MRN: [id number] Date of Birth: 04/09/1951  Transition of Care (TOC) CM/SW Contact:    CLEMENTS, NORA H, RN Phone Number:[phone] 08/25/2019, 1:45 PM  Clinical Narrative:                 This CM met with pt and son at bedside for dc planning. PT recommendations reviewed with them.  Pt son states that he has been in communication with [facility] for his mom and plans to use them at discharge. This CM contacted [facility] liaison to give official referral. Pt will most likely need 3in1, RW, and 02 for home. TOC will continue to follow.  Expected Discharge Plan: Home w Home Health Services Barriers to Discharge: Continued Medical Work up   Patient Goals and CMS Choice Patient states their goals for this hospitalization and ongoing recovery are:: To get home      Expected Discharge Plan and Services Expected Discharge Plan: Home w Home Health Services   Discharge Planning Services: CM Consult   Living arrangements for the past 2 months: Single Family Home                           HH Arranged: PT, OT, RN, Nurse's Aide HH Agency: [facility] Date HH Agency Contacted: 08/25/19 Time HH Agency Contacted: 1344 Representative spoke with at HH Agency: Cheryl Rose  Prior Living Arrangements/Services Living arrangements for the past 2 months: Single Family Home Lives with:: Spouse Patient language and need for interpreter reviewed:: Yes Do you feel safe going back to the place where you live?: Yes      Need for Family Participation in Patient Care: Yes (Comment) Care giver support system in place?: Yes (comment)   Criminal Activity/Legal Involvement Pertinent to Current Situation/Hospitalization: No - Comment as needed  Activities of Daily Living Home Assistive Devices/Equipment: None ADL Screening (condition at time of admission) Patient's cognitive ability  adequate to safely complete daily activities?: No Is the patient deaf or have difficulty hearing?: No Does the patient have difficulty seeing, even when wearing glasses/contacts?: No Does the patient have difficulty concentrating, remembering, or making decisions?: No Patient able to express need for assistance with ADLs?: Yes Does the patient have difficulty dressing or bathing?: No Independently performs ADLs?: Yes (appropriate for developmental age) Does the patient have difficulty walking or climbing stairs?: No Weakness of Legs: Both Weakness of Arms/Hands: None  Permission Sought/Granted Permission sought to share information with : Facility Contact Representative Permission granted to share information with : Yes, Verbal Permission Granted     Permission granted to share info w AGENCY: [facility]        Emotional Assessment Appearance:: Appears stated age Attitude/Demeanor/Rapport: Sedated Affect (typically observed): Accepting Orientation: : Oriented to Self, Oriented to Place, Oriented to  Time, Oriented to Situation   Psych Involvement: No (comment)  Admission diagnosis:  Pleural effusion, right [J90] Postobstructive pneumonia [J18.9] Pulmonary mass [R91.8] Patient Active Problem List   Diagnosis Date Noted  . Malnutrition of moderate degree 08/24/2019  . Stage IV adenocarcinoma of lung, right (HCC) 08/14/2019  . Acute respiratory failure with hypoxemia (HCC) 08/12/2019  . SOB (shortness of breath)   . Postobstructive pneumonia 08/07/2019  . Pleural effusion on right 08/07/2019  . Prolonged QT interval 08/07/2019  . Mass of right lung   . Hyperlipidemia 09/27/2018  . Tobacco abuse   09/27/2018  . COPD (chronic obstructive pulmonary disease) (HCC) 09/27/2018  . Palpitations 09/27/2018   PCP:  Little, Kevin, MD Pharmacy:   [id number] - [place] - [address] [place] [address] Phone: [phone] Fax:  [phone]     Social Determinants of Health (SDOH) Interventions    Readmission Risk Interventions No flowsheet data found.  

## 2019-08-25 NOTE — Progress Notes (Signed)
Bipap not indicated at this time, will continue to monitor for need.

## 2019-08-25 NOTE — Progress Notes (Signed)
Stat chest xray ordered per dr Chase Caller.

## 2019-08-25 NOTE — Progress Notes (Signed)
Dyanne Iha NP notified of bladder scan x 2 tonight.  2300 was 175 cc and 0430 was 200 cc.  Orders to in and out cath patient.

## 2019-08-25 NOTE — Progress Notes (Signed)
[facility] notified of increased oxygen needs throughout night.  No futher orders at this time.  Will await rounding and monitor closely.

## 2019-08-25 NOTE — Progress Notes (Signed)
PT Cancellation Note  Patient Details Name: Jordan Clay MRN: [id number] DOB: [birth year]   Cancelled Treatment:     Per RN pt unable to tolerate Physical Therapy this morning "maybe later" however I was unable to return to unit due to schedule.  Will attempt to see early next week.     Rica Koyanagi  PTA Acute  Rehabilitation Services Pager      [phone] Office      [phone]

## 2019-08-25 NOTE — Progress Notes (Addendum)
PROGRESS NOTE    Jordan Clay  YBO:[id number]  DOB: 03/15/1951  DOA: 08/07/2019 PCP: Hulan Fess, MD  Brief Narrative:  68 yo female with 40 pack year smoking history,COPD who was recently treated for pneumonia by her PCP a week prior to admission,hyperlipidemia presented to [facility] on 10/19 with reports of 2 weeks of cough & SOB. CTA chest revealed obstruction of the central RLL bronchus with associated postobstructive PNA & pleural effusion. She underwent thoracentesis on 10/20 by IR with pleural fluid consistent with metastatic adenocarcinoma. PCCM consulted and patient underwent FOB with EBUS on 10/21 with pathology consistent with non-small cell carcinoma. Oncology, Dr Alen Blew, was consulted - not felt to be a candidate for systemic chemotherapy currently-subsequently rad-onc involved with plan for 2 week XRT starting 08/14/19. Patient however transferred to SDU for hypoxic resp failure on 10/24 requiring right sided chest tube placement--subsequently converted to pleurx catheter. Patient was requiring 20 lits [place] flow 02 until last week but improving now and down to 3lits [place] on 11/3. Cough is improving. Patient received abx: Rocephin/Azithro 10/19 >> 10/24 and Unasyn 10/26 >> 10/31. COVID -ve, blood cx and pleural fluid/BAL cx -ve so far. Patient completed 7/14 radiation treatments. Patient has had systemic symptoms with nausea etc but overall Rad onc communicated to family that patient responding to XRT on 11/4. Transferred to hospitalist service on 11/5.  Subjective:  Patient had recurrence of urinary retention overnight requiring straight cath x2.  Patient also grew more tachypneic overnight with increasing O2 needs and requiring 7 L this morning.  Stat chest x-ray ordered and requested PCCM to follow-up for possible Pleurx drainage.  On my arrival to the room patient being drained and appeared to be mentating well but coughing.  She states overall her coughing is better with scheduled nebs  and Tessalon Perles.     Objective: Vitals:   08/25/19 0758 08/25/19 0800 08/25/19 0900 08/25/19 1000  BP:  90/61 (!) 96/49 (!) 106/59  Pulse:  (!) 116 (!) 112 (!) 120  Resp:  17 17 (!) 27  Temp: 98 F (36.7 C)     TempSrc: Oral     SpO2:  (!) 89% 92% (!) 84%  Weight:      Height:        Intake/Output Summary (Last 24 hours) at 08/25/2019 1337 Last data filed at 08/25/2019 1000 Gross per 24 hour  Intake 1608.77 ml  Output 1050 ml  Net 558.77 ml   Filed Weights   08/07/19 1504 08/21/19 2300  Weight: 70.3 kg 73.4 kg    Physical Examination:  General exam: Appears calm and comfortable other than coughing spells Respiratory system: Reduced breath sounds at right base.  Respiratory effort normal between coughing spells.  Pleurx catheter draining straw-colored fluid and maximizing around 500 mL Cardiovascular system: S1 & S2 heard, tachycardic, RRR. No JVD, murmurs, rubs, gallops or clicks. No pedal edema. Gastrointestinal system: Abdomen is nondistended, soft and nontender. No organomegaly or masses felt. Normal bowel sounds heard. Central nervous system: Alert and oriented. No focal neurological deficits. Extremities: Symmetric 5 x 5 power. Skin: Patient noted to have macular rash along right scapular area extending to right axilla (she reports allergy to local dressing/tape) she also has several pressure dressings on mid/lower back at the site of previous skin break/allergy Psychiatry: Judgement and insight appear normal. Mood & affect appropriate.     Data Reviewed: I have personally reviewed following labs and imaging studies  CBC: Recent Labs  Lab 08/19/19 0241  08/24/19 1744 08/25/19 0407  WBC 20.5* 30.2* 31.6*  HGB 12.7 13.4 13.9  HCT 39.9 42.4 44.7  MCV 93.7 93.8 94.5  PLT 301 281 638   Basic Metabolic Panel: Recent Labs  Lab 08/19/19 0241 08/24/19 1744 08/25/19 0407  NA 136 135 138  K 4.0 3.8 3.7  CL 102 99 100  CO2 _0 GLUCOSE 122* 139* 120*    BUN _1 CREATININE 0.65 0.75 0.68  CALCIUM 7.6* 8.2* 8.1*   GFR: Estimated Creatinine Clearance: 66.4 mL/min (by C-G formula based on SCr of 0.68 mg/dL). Liver Function Tests: No results for input(s): AST, ALT, ALKPHOS, BILITOT, PROT, ALBUMIN in the last 168 hours. No results for input(s): LIPASE, AMYLASE in the last 168 hours. No results for input(s): AMMONIA in the last 168 hours. Coagulation Profile: No results for input(s): INR, PROTIME in the last 168 hours. Cardiac Enzymes: No results for input(s): CKTOTAL, CKMB, CKMBINDEX, TROPONINI in the last 168 hours. BNP (last 3 results) No results for input(s): PROBNP in the last 8760 hours. HbA1C: No results for input(s): HGBA1C in the last 72 hours. CBG: Recent Labs  Lab 08/24/19 0808 08/24/19 1246 08/24/19 1651 08/24/19 1940 08/24/19 2316  GLUCAP 119* 124* 141* 97 113*   Lipid Profile: No results for input(s): CHOL, HDL, LDLCALC, TRIG, CHOLHDL, LDLDIRECT in the last 72 hours. Thyroid Function Tests: No results for input(s): TSH, T4TOTAL, FREET4, T3FREE, THYROIDAB in the last 72 hours. Anemia Panel: No results for input(s): VITAMINB12, FOLATE, FERRITIN, TIBC, IRON, RETICCTPCT in the last 72 hours. Sepsis Labs: No results for input(s): PROCALCITON, LATICACIDVEN in the last 168 hours.  No results found for this or any previous visit (from the past 240 hour(s)).    Radiology Studies: Dg Chest Port 1 View  Result Date: 08/25/2019 CLINICAL DATA:  Increased shortness of breath. EXAM: PORTABLE CHEST 1 VIEW COMPARISON:  Chest x-ray 08/21/2019. FINDINGS: Right pleural catheter again noted with tip over the lateral aspect of the right upper chest. Unchanged large right pleural effusion. Unchanged right lung infiltrate. Persistent left base atelectasis. No pneumothorax. Stable cardiomegaly. No acute bony abnormality. IMPRESSION: 1. Right pleural catheter again noted with tip over the lateral aspect of the right upper chest.  Unchanged large right pleural effusion. No pneumothorax. 2. Unchanged right lung infiltrate. Persistent left base atelectasis. 3.  Stable cardiomegaly. Electronically Signed   By: Marcello Moores  Register   On: 08/25/2019 07:37        Scheduled Meds:  benzonatate  200 mg Oral TID   Chlorhexidine Gluconate Cloth  6 each Topical Daily   chlorpheniramine-HYDROcodone  5 mL Oral Q6H   enoxaparin (LOVENOX) injection  40 mg Subcutaneous Q24H   feeding supplement (ENSURE ENLIVE)  237 mL Oral BID BM   gabapentin  100 mg Oral TID   hydrocortisone cream   Topical BID   insulin aspart  0-9 Units Subcutaneous Q4H   ipratropium-albuterol  3 mL Nebulization TID   mouth rinse  15 mL Mouth Rinse BID   methylPREDNISolone (SOLU-MEDROL) injection  40 mg Intravenous Q24H   pantoprazole  40 mg Oral BID AC   polyethylene glycol  17 g Oral BID   ramelteon  8 mg Oral QHS   sodium chloride flush  3 mL Intravenous Q12H   Continuous Infusions:   Assessment & Plan:    1.Acute hypoxic respiratory failure secondary to lung malignancy/obstructed airway/trapped lung/?  Postobstructive pneumonia: CTA chest revealed obstruction of the central RLL bronchus with associated  postobstructive PNA & pleural effusion. She underwent thoracentesis on 10/20 by IR with pleural fluid consistent with metastatic adenocarcinoma. PCCM consulted and patient underwent FOB with EBUS on 10/21 with pathology consistent with non-small cell carcinoma.  Now has right Pleurx catheter in place--pulmonary to follow-up in a.m. and advise if patient can have Pleurx drainage more frequently.  Patient received Rocephin/Azithro 10/19 >> 10/24 and Unasyn 10/26 >> 10/31. COVID -ve, blood cx and pleural fluid/BAL cx -ve so far. Patient completed 7/14 radiation treatments.  Patient was saturating well on 3 L nasal cannula yesterday but increased oxygen requirement overnight in the setting of IV hydration for hypotension.  Chest x-ray, personally  reviewed and shows worsening right-sided large effusion.  Discussed with PCCM and Pleurx catheter drained this morning.  PCCM recommends continuing 3 times weekly drainage for now on Monday Wednesday Friday and as needed drainage of max 500 mL for shortness of breath or reaccumulation.  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  Not a candidate for chemotherapy.  Poor prognosis overall.  Family appears to be supportive.  Patient also being followed by palliative care with whom I have discussed and they have changed Tessalon Perles to scheduled dosing as appeared to be helping patient..  DNR  3. Refractory cough: Likely secondary to pleurisy/pleural irritation and problem #2.  Patient on gabapentin and Tussionex scheduled dosing.  Added Tessalon. Cough appears to be mostly dry.  Can add guaifenesin if patient experiences trouble bringing up phlegm.  Per PCCM, might have underlying COPD. She remains on IV solumedrol.  Patient  on scheduled duo nebs and albuterol nebulizer as needed -continue the same to help with both cough and posttussive bronchospasm.   4.  Acute on chronic hypotension: Patient had drop in systolic blood pressure to 70s yesterday morning and diastolic into 09F.  She states her baseline blood pressure is around systolic 81W to 29H and diastolic in the 37J.  Blood pressure improved with 500 mL LR bolus and albumin infusion.  Blood pressure improved to systolic 69C.  Patient was kept on maintenance fluids LR at 50/h overnight but chest x-ray this morning with worsening pleural effusion.  Discontinued IV fluids and Pleurx catheter drained-too frequent drainage could worsen hypotension with third spacing-max 500 mL suggested.  5. Hyperlipidemia: May not need meds anymore.  Will repeat lipid profile in a.m.  6. Insomnia-PCCM changed ambien to restoroil- continue rozerem.  Nurse  requesting as needed meds for anxiety spells.  Hydroxyzine added.  7.  Urinary retention: Patient noted to have urinary retention of 450 mL yesterday requiring straight cath x1.  Ordered indwelling Foley this morning as nurse reports recurrent retention.  8. Skin rashes: Per bedside nurse, who knows patient from last week, patient skin rashes were much worse from paper tapes that were used at procedure sites hence pressure dressings were placed.  According to nurse current scapular/axillary rash much better than before.  Will try local hydrocortisone cream.  Nurse also reported noting rash on inner thigh while placing Foley, nonitching.  9.  Prolonged QT interval: Last EKG from 10/22 shows improvement of QT interval to 435 ms.  Repeat in a.m.   10. Leucocytosis: In the setting of steroids. Afebrile.   DVT prophylaxis: On Lovenox Code Status: DNR Family / Patient Communication: Discussed with husband and sons bedside Disposition Plan: TBD.  Palliative care following     LOS: 18 days  Time spent: 35 minutes    Guilford Shi, MD [facility] Pager [phone]  If 7PM-7AM, please contact night-coverage [email or web address] Password [email or web address] 08/25/2019, 1:37 PM

## 2019-08-26 DIAGNOSIS — Z9689 Presence of other specified functional implants: Secondary | ICD-10-CM

## 2019-08-26 LAB — CBC
HCT: 42.6 % (ref 36.0–46.0)
Hemoglobin: 13.5 g/dL (ref 12.0–15.0)
MCH: 30 pg (ref 26.0–34.0)
MCHC: 31.7 g/dL (ref 30.0–36.0)
MCV: 94.7 fL (ref 80.0–100.0)
Platelets: 228 10*3/uL (ref 150–400)
RBC: 4.5 MIL/uL (ref 3.87–5.11)
RDW: 13.3 % (ref 11.5–15.5)
WBC: 36.6 10*3/uL — ABNORMAL HIGH (ref 4.0–10.5)
nRBC: 0.1 % (ref 0.0–0.2)

## 2019-08-26 LAB — GLUCOSE, CAPILLARY
Glucose-Capillary: 113 mg/dL — ABNORMAL HIGH (ref 70–99)
Glucose-Capillary: 127 mg/dL — ABNORMAL HIGH (ref 70–99)
Glucose-Capillary: 140 mg/dL — ABNORMAL HIGH (ref 70–99)
Glucose-Capillary: 140 mg/dL — ABNORMAL HIGH (ref 70–99)
Glucose-Capillary: 129 mg/dL — ABNORMAL HIGH (ref 70–99)
Glucose-Capillary: 106 mg/dL — ABNORMAL HIGH (ref 70–99)

## 2019-08-26 LAB — LIPID PANEL
Cholesterol: 149 mg/dL (ref 0–200)
HDL: 41 mg/dL (ref >40)
LDL Cholesterol: 77 mg/dL (ref 0–99)
Total CHOL/HDL Ratio: 3.6 RATIO
Triglycerides: 153 mg/dL — ABNORMAL HIGH (ref <150)
VLDL: 31 mg/dL (ref 0–40)

## 2019-08-26 LAB — BASIC METABOLIC PANEL
Anion gap: 12 (ref 5–15)
BUN: 16 mg/dL (ref 8–23)
CO2: 26 mmol/L (ref 22–32)
Calcium: 8.4 mg/dL — ABNORMAL LOW (ref 8.9–10.3)
Chloride: 101 mmol/L (ref 98–111)
Creatinine, Ser: 0.53 mg/dL (ref 0.44–1.00)
GFR calc Af Amer: >60 mL/min (ref >60)
GFR calc non Af Amer: >60 mL/min (ref >60)
Glucose, Bld: 105 mg/dL — ABNORMAL HIGH (ref 70–99)
Potassium: 3.7 mmol/L (ref 3.5–5.1)
Sodium: 139 mmol/L (ref 135–145)

## 2019-08-26 NOTE — Progress Notes (Signed)
[facility]                                                                              Patient Demographics  Jordan Clay, is a 68 y.o. female, DOB - 10/09/1951, BSJ:[id number]  Admit date - 08/07/2019   Admitting Physician Vianne Bulls, MD  Outpatient Primary MD for the patient is Hulan Fess, MD  Outpatient specialists:   LOS - 19  days   Medical records reviewed and are as summarized below:    Chief Complaint  Patient presents with   COPD   low sats       Brief summary   Patient is a 68 year old female with 40 pack year smoking history,COPD who was recently treated for pneumonia by her PCP a week prior to admission,hyperlipidemia presented to [facility] on 10/19 with reports of 2 weeks of cough &SOB. CTA chest revealed obstruction of the central RLL bronchus with associated postobstructive PNA &pleural effusion. She underwent thoracentesis on 10/20 by IR with pleural fluid consistent with metastatic adenocarcinoma. PCCM was consulted and patient underwent EBUS on 10/21 with pathology consistent with non-small cell carcinoma. Oncology, Dr Alen Blew, was consulted - not felt to be a candidate for systemic chemotherapy currently.  subsequently rad-onc involved with plan for 2 week XRT starting 08/14/19. Patient however was transferred to SDU for hypoxic resp failure on 10/24 requiring right sided chest tube placement--subsequently converted to pleurx catheter. Patient was requiring 20 lits [place] flow 02 until last week but improving now and down to 3lits [place] on 11/3. Patient received abx: Rocephin/Azithro 10/19 >> 10/24 and Unasyn 10/26 >> 10/31. COVID -ve, blood cx and pleural fluid/BAL cx -ve so far. Patient completed 7/14 radiation treatments. Transfer to hospitalist service on 11/5.  Assessment & Plan    Principal Problem: Acute respiratory failure with hypoxia secondary to lung CA/obstructed airway/postobstructive pneumonia -CTA chest revealed obstruction  of the central RLL bronchus with associated postobstructive PNA and pleural effusion.  COVID-19 test negative -Underwent thoracentesis on 10/20 with pleural fluid consistent with metastatic adeno CA -PCCM was consulted, underwent EBUS on 10/21, pathology consistent with non-small cell carcinoma -Status post right Pleurx catheter placement -Received Rocephin/Azithro 10/19 >> 10/24 and Unasyn 10/26 > 10/31 -Chest x-ray 11/6 showed worsening right-sided pleural effusion, PCCM recommended continuing 3 times weekly drainage for now, MWF and as needed, drainage of max 500 cc for shortness of breath or reaccumulation. 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  Currently significant coughing, shortness of breath with minimal exertion, poor functional status.  Requested palliative care consult for goals of care and symptom management -Currently on Tessalon Perles, Tussionex scheduled for cough, remains on IV Solu-Medrol, duo nebs -Patient declined SNF.  Acute on chronic hypotension -Patient had systolic BP drop to around 70s on 11/5 has baseline low BP. -Avoid too frequent or large volume Pleurx catheter drainage  Hyperlipidemia LDL 77, cholesterol 149, triglycerides 153  Urinary retention Patient was noted to have urinary retention on 11/5, continue Foley  Leukocytosis  In the setting of steroids, not spiking any fevers  Malnutrition of moderate degree. -BMI 25.3 Patient reports difficulty eating, will obtain SLP evaluation  Code Status: DNR DVT Prophylaxis:  Lovenox  Family Communication: Discussed all imaging results, lab results, explained to the patient    Disposition Plan: High risk of respiratory deterioration, continue stepdown status  Time Spent in minutes   35 minutes  Procedures:    Consultants:     Pulmonology Oncology Palliative medicine  Antimicrobials:   Anti-infectives (From admission, onward)   Start     Dose/Rate Route Frequency Ordered Stop   08/14/19 1100  Ampicillin-Sulbactam (UNASYN) 3 g in sodium chloride 0.9 % 100 mL IVPB     3 g 200 mL/hr over 30 Minutes Intravenous Every 6 hours 08/14/19 1026 08/19/19 0556   08/10/19 1200  cefTRIAXone (ROCEPHIN) 2 g in sodium chloride 0.9 % 100 mL IVPB     2 g 200 mL/hr over 30 Minutes Intravenous Every 24 hours 08/10/19 1043 08/13/19 1355   08/08/19 1600  cefTRIAXone (ROCEPHIN) 2 g in sodium chloride 0.9 % 100 mL IVPB  Status:  Discontinued     2 g 200 mL/hr over 30 Minutes Intravenous Every 24 hours 08/07/19 2043 08/10/19 0723   08/08/19 1600  azithromycin (ZITHROMAX) 500 mg in sodium chloride 0.9 % 250 mL IVPB     500 mg 250 mL/hr over 60 Minutes Intravenous Every 24 hours 08/07/19 2043 08/12/19 1844   08/07/19 1600  cefTRIAXone (ROCEPHIN) 1 g in sodium chloride 0.9 % 100 mL IVPB     1 g 200 mL/hr over 30 Minutes Intravenous  Once 08/07/19 1556 08/07/19 1910   08/07/19 1600  azithromycin (ZITHROMAX) 500 mg in sodium chloride 0.9 % 250 mL IVPB     500 mg 250 mL/hr over 60 Minutes Intravenous  Once 08/07/19 1556 08/07/19 2012          Medications  Scheduled Meds:  benzonatate  200 mg Oral TID   Chlorhexidine Gluconate Cloth  6 each Topical Daily   chlorpheniramine-HYDROcodone  5 mL Oral Q6H   enoxaparin (LOVENOX) injection  40 mg Subcutaneous Q24H   feeding supplement (ENSURE ENLIVE)  237 mL Oral BID BM   gabapentin  100 mg Oral TID   hydrocortisone cream   Topical BID   insulin aspart  0-9 Units Subcutaneous Q4H   ipratropium-albuterol  3 mL Nebulization TID   mouth rinse  15 mL Mouth Rinse BID   methylPREDNISolone (SOLU-MEDROL) injection  40 mg Intravenous Q24H   pantoprazole  40 mg Oral BID AC   polyethylene glycol  17 g Oral BID   ramelteon  8 mg Oral QHS   sodium chloride flush  3 mL  Intravenous Q12H   Continuous Infusions: PRN Meds:.acetaminophen **OR** acetaminophen, albuterol, bisacodyl, hydrOXYzine, menthol-cetylpyridinium, menthol-cetylpyridinium, morphine injection, oxyCODONE, phenol, senna-docusate, temazepam      Subjective:   Jordan Clay was seen and examined today.  Sick appearing, multiple bouts of coughing during the encounter, short of breath on minimal movement.  No chest pain.  No nausea vomiting or abdominal pain.  No acute events overnight.  No fevers  Objective:   Vitals:   08/26/19 0900 08/26/19 1151 08/26/19 1200 08/26/19 1300  BP: (!) 84/54  (!) 90/58 (!) 91/53  Pulse: (!) 103  (!) 107 (!) 106  Resp: (!) _0 Temp: 97.9 F (36.6 C) 98.2 F (36.8 C)    TempSrc: Oral Oral    SpO2: 92%  93% 92%  Weight:      Height:        Intake/Output Summary (Last 24 hours) at 08/26/2019 1403 Last data filed at 08/26/2019 1000 Gross per 24 hour  Intake 200 ml  Output 825 ml  Net -625 ml     Wt Readings from Last 3 Encounters:  08/21/19 73.4 kg  09/27/18 67.5 kg     Exam  General: Alert and oriented x 3, sick appearing, coughing spells and short of breath  Eyes:   HEENT:  Atraumatic, normocephalic, normal oropharynx  Cardiovascular: S1 S2 auscultated, no murmurs, RRR  Respiratory: Decreased breath sound at the bases, Pleurx catheter  Gastrointestinal: Soft, nontender, nondistended, + bowel sounds  Ext: no pedal edema bilaterally  Neuro: No new deficits  Musculoskeletal: No digital cyanosis, clubbing  Skin: Macular rash along the right scapular area extending to right axilla  Psych: Normal affect and demeanor, alert and oriented x3    Data Reviewed:  I have personally reviewed following labs and imaging studies  Micro Results No results found for this or any previous visit (from the past 240 hour(s)).  Radiology Reports Dg Chest 1 View  Result Date: 08/21/2019 CLINICAL DATA:  Malignant pleural effusion EXAM:  CHEST  1 VIEW COMPARISON:  Three days ago FINDINGS: Tunneled pleural catheter has than advanced with a more vertical appearance. Right pleural effusion has decreased mildly, with presumed ex vacuo pneumothorax at the apex where there was pleural fluid. Pulmonary opacity on both sides. Mediastinal enlargement from bulky adenopathy. IMPRESSION: Advanced right pleural catheter. There is decreased right pleural fluid but new, likely ex vacuo pneumothorax at the apex. Stable bilateral airspace disease. Electronically Signed   By: Monte Fantasia M.D.   On: 08/21/2019 05:06   Dg Chest 1 View  Result Date: 08/08/2019 CLINICAL DATA:  Post right thoracentesis EXAM: CHEST  1 VIEW COMPARISON:  08/07/2019 FINDINGS: Heart is normal size. Consolidation in the right lower lung with right pleural effusion. No pneumothorax following thoracentesis. Left lung clear. No acute bony abnormality. Heart is normal size. IMPRESSION: Continued right lower lung airspace opacity with small to moderate right effusion. No pneumothorax following thoracentesis. Electronically Signed   By: Rolm Baptise M.D.   On: 08/08/2019 10:26   Ct Chest Wo Contrast  Result Date: 08/19/2019 CLINICAL DATA:  Non-small cell lung cancer, evaluate PleurX catheter placement and response to radiation EXAM: CT CHEST WITHOUT CONTRAST TECHNIQUE: Multidetector CT imaging of the chest was performed following the standard protocol without IV contrast. COMPARISON:  CT chest angiogram, 08/07/2019 FINDINGS: Cardiovascular: Aortic atherosclerosis. Normal heart size. Trace pericardial effusion. Mediastinum/Nodes: Very bulky superior mediastinal, mediastinal, and right hilar lymphadenopathy is increased in bulk compared to prior examination, the largest pretracheal lymph node that can be discretely measured measures 3.3 x 3.0 cm, previously 3.1 x 2.5 cm when measured similarly (series 2, image 55). There has particularly been an increase in bulk of matted superior  mediastinal lymph nodes (series 2, image 23). Thyroid gland, trachea, and esophagus demonstrate no significant findings. Lungs/Pleura: There has been interval placement of a right-sided tunneled pleural drainage catheter, which is looped around the anterior pleural space, traversing the minor fissure and epicardial fat lobulations, tip positioned about the superolateral right pleural space. There is a moderate volume right hydropneumothorax with a small right pneumothorax component and moderate pleural effusion. There is a small left pleural effusion. Diffuse bilateral interlobular septal thickening, ground-glass opacity and some evidence of pulmonary nodularity, for example in the right pulmonary  apex new nodules measuring up to 1.0 cm (series 5, image 26). This mild underlying emphysema. There has been increase in dense, masslike postobstructive consolidation of the right lung, with total consolidation of the right middle and right lower lobes. Perihilar mass cannot be discerned from adjacent consolidated lung on noncontrast examination. Upper Abdomen: No acute abnormality. Enlarged gastrohepatic lymph nodes in the upper abdomen, increased in size compared to prior examination, measuring up to 2.0 x 1.6 cm, previously 1.7 x 1.1 cm (series 2, image 140). Musculoskeletal: No chest wall mass or suspicious bone lesions identified. IMPRESSION: 1. There has been interval placement of a right-sided tunneled pleural drainage catheter, which is looped around the anterior pleural space, traversing the minor fissure and epicardial fat lobulations, tip positioned about the superolateral right pleural space. There is a moderate volume right hydropneumothorax with a small right pneumothorax component and moderate pleural effusion. There is a small left pleural effusion. 2. There has been increase in dense, masslike postobstructive consolidation of the right lung, with total consolidation of the right middle and right lower  lobes. Perihilar mass cannot be discerned from adjacent consolidated lung on noncontrast examination. 3. Diffuse bilateral interlobular septal thickening, ground-glass opacity and some evidence of pulmonary nodularity, for example in the right pulmonary apex new nodules measuring up to 1.0 cm (series 5, image 26). These findings may reflect a component of edema but are very concerning for lymphangitic spread of metastatic disease. 4. Very bulky superior mediastinal, mediastinal, and right hilar lymphadenopathy is increased in bulk compared to prior examination, the largest pretracheal lymph node that can be discretely measured measures 3.3 x 3.0 cm, previously 3.1 x 2.5 cm when measured similarly (series 2, image 55). There has particularly been an increase in bulk of matted superior mediastinal lymph nodes (series 2, image 23). 5. Enlarged gastrohepatic lymph nodes in the upper abdomen, increased in size compared to prior examination, measuring up to 2.0 x 1.6 cm, previously 1.7 x 1.1 cm (series 2, image 140). 6.  Emphysema (ICD10-J43.9).  Aortic Atherosclerosis (ICD10-I70.0). Electronically Signed   By: Eddie Candle M.D.   On: 08/19/2019 13:48   Ct Angio Chest Pe W/cm &/or Wo Cm  Result Date: 08/07/2019 CLINICAL DATA:  Shortness of breath and hypoxia. Recent pneumonia. EXAM: CT ANGIOGRAPHY CHEST WITH CONTRAST TECHNIQUE: Multidetector CT imaging of the chest was performed using the standard protocol during bolus administration of intravenous contrast. Multiplanar CT image reconstructions and MIPs were obtained to evaluate the vascular anatomy. CONTRAST:  147m OMNIPAQUE IOHEXOL 350 MG/ML SOLN COMPARISON:  06/10/2018 FINDINGS: Cardiovascular: Satisfactory opacification of pulmonary arteries noted, and no pulmonary emboli identified. No evidence of thoracic aortic dissection or aneurysm. Aortic atherosclerosis. Mediastinum/Nodes: New bulky lymphadenopathy is seen throughout the right paratracheal and subcarinal  regions, with subcarinal soft tissue density measuring up to 4.2 cm short axis. Right hilar lymphadenopathy is also seen measuring at least 2.2 cm in short axis. Obstruction of the central right lower lobe bronchus and central low-attenuation is suspicious for centrally obstructing mass. Lungs/Pleura: There is near complete right lower lobe consolidation, suspicious for postobstructive pneumonitis. A moderate right pleural effusion is also seen. Several bilateral upper lobe and right middle lobe pulmonary nodules are seen measuring up to 5 mm which are stable. Mild emphysema again noted.33 Upper abdomen: No acute findings. Musculoskeletal: No suspicious bone lesions identified. Review of the MIP images confirms the above findings. IMPRESSION: No evidence of pulmonary embolism. Obstruction of central right lower lobe bronchus and central low-attenuation, suspicious  for centrally obstructing mass. Consider further evaluation with bronchoscopy. Near complete right lower lobe consolidation, suspicious for postobstructive pneumonitis. Moderate right pleural effusion. Consider diagnostic thoracentesis. New bulky mediastinal and right hilar lymphadenopathy, consistent with metastatic disease. Stable sub-cm bilateral upper lobe and right middle lobe pulmonary nodules. Aortic Atherosclerosis (ICD10-I70.0) and Emphysema (ICD10-J43.9). Electronically Signed   By: Marlaine Hind M.D.   On: 08/07/2019 19:23   Ct Abdomen Pelvis W Contrast  Result Date: 08/09/2019 CLINICAL DATA:  Newly diagnosed right lung carcinoma. Staging. EXAM: CT ABDOMEN AND PELVIS WITH CONTRAST TECHNIQUE: Multidetector CT imaging of the abdomen and pelvis was performed using the standard protocol following bolus administration of intravenous contrast. CONTRAST:  134m OMNIPAQUE IOHEXOL 300 MG/ML SOLN, 366mOMNIPAQUE IOHEXOL 300 MG/ML SOLN COMPARISON:  Chest only CTA on 08/07/2019; no prior AP CT FINDINGS: Lower Chest: Mediastinal and right hilar  lymphadenopathy, bilateral pleural effusions, and right basilar consolidation, as better demonstrated on recent chest CT. Hepatobiliary: No hepatic masses identified. Pancreas:  No mass or inflammatory changes. Spleen: Within normal limits in size and appearance. Adrenals/Urinary Tract: No masses identified. No evidence of hydronephrosis. Stomach/Bowel: No evidence of obstruction, inflammatory process or abnormal fluid collections. Normal appendix visualized. Vascular/Lymphatic: Mild upper lymphadenopathy is seen in the gastrohepatic ligament, with largest lymph node measuring 1.3 cm short axis. A 10 mm retroperitoneal lymph node is seen in the left paraaortic region. No lymphadenopathy identified within the pelvis. No abdominal aortic aneurysm. Aortic atherosclerosis. Reproductive:  No mass or other significant abnormality. Other:  None. Musculoskeletal:  No suspicious bone lesions identified. IMPRESSION: Mild upper abdominal lymphadenopathy in gastrohepatic ligament and left paraaortic region, consistent with metastatic disease. No other sites of metastatic disease identified within the abdomen or pelvis. Electronically Signed   By: JoMarlaine Hind.D.   On: 08/09/2019 18:28   Dg Chest Port 1 View  Result Date: 08/25/2019 CLINICAL DATA:  Increased shortness of breath. EXAM: PORTABLE CHEST 1 VIEW COMPARISON:  Chest x-ray 08/21/2019. FINDINGS: Right pleural catheter again noted with tip over the lateral aspect of the right upper chest. Unchanged large right pleural effusion. Unchanged right lung infiltrate. Persistent left base atelectasis. No pneumothorax. Stable cardiomegaly. No acute bony abnormality. IMPRESSION: 1. Right pleural catheter again noted with tip over the lateral aspect of the right upper chest. Unchanged large right pleural effusion. No pneumothorax. 2. Unchanged right lung infiltrate. Persistent left base atelectasis. 3.  Stable cardiomegaly. Electronically Signed   By: ThMarcello MooresRegister   On:  08/25/2019 07:37   Dg Chest Port 1 View  Result Date: 08/19/2019 CLINICAL DATA:  Postobstructive pneumonia. Ex-smoker. Follow-up right pneumothorax. EXAM: PORTABLE CHEST 1 VIEW COMPARISON:  08/16/2019 FINDINGS: The previously demonstrated small right pneumothorax is no longer demonstrated with a chest tube remaining in place. There is increased pleural fluid on the right with a moderate to large sized pleural effusion currently demonstrated. There is less patchy opacity in the right lung. The interstitial markings in the left lung remain prominent. The cardiac silhouette remains borderline enlarged. Unremarkable bones. IMPRESSION: 1. Resolved small right pneumothorax. 2. Increased right pleural fluid with a moderate to large sized pleural effusion currently demonstrated. 3. Decreased patchy atelectasis or pneumonia in the right lung. 4. Stable changes of COPD with chronic interstitial lung disease and possible interstitial pneumonitis. Electronically Signed   By: StClaudie Revering.D.   On: 08/19/2019 06:06   Dg Chest Port 1 View  Result Date: 08/16/2019 CLINICAL DATA:  89ear old female with chest tube placement EXAM: PORTABLE  CHEST 1 VIEW COMPARISON:  Multiple prior chest x-ray most recent 08/15/2019 FINDINGS: Cardiomediastinal silhouette unchanged in size and contour, with the heart borders obscured by overlying lung/pleural disease. Similar appearance of opacity in the right suprahilar region along the mediastinal border. Dense opacity at the right lower lung, with a gradient of reticulonodular opacities decreasing towards the upper lung. Interval development of small right pneumothorax and right chest wall myofacial/subcutaneous gas. Unchanged right thoracostomy tube. Reticular opacities of the left lung, similar to the prior. IMPRESSION: Unchanged right thoracostomy tube with interval development of small right pneumothorax, as well as subcutaneous/myofacial gas of the right chest wall. Unchanged right  basilar opacity likely a combination of consolidation/atelectasis, tumor, and/or residual pleural fluid. Reticular opacities bilaterally suggesting increasing edema. Electronically Signed   By: Corrie Mckusick D.O.   On: 08/16/2019 07:55   Dg Chest Port 1 View  Result Date: 08/15/2019 CLINICAL DATA:  Respiratory failure. EXAM: PORTABLE CHEST 1 VIEW COMPARISON:  August 14, 2019. FINDINGS: Stable cardiomediastinal silhouette. Right-sided chest tube is noted without pneumothorax. Stable right lung opacity is noted with associated pleural effusion. Stable left lung opacities are noted as well. Atherosclerosis of thoracic aorta is noted. No pneumothorax. Bony thorax is unremarkable. IMPRESSION: Stable right lung opacity with associated pleural effusion. Stable right-sided chest tube without pneumothorax. Stable left lung opacities are noted as well. Aortic Atherosclerosis (ICD10-I70.0). Electronically Signed   By: Marijo Conception M.D.   On: 08/15/2019 07:42   Dg Chest Port 1 View  Result Date: 08/14/2019 CLINICAL DATA:  Pleural effusion on the right EXAM: PORTABLE CHEST 1 VIEW COMPARISON:  Yesterday FINDINGS: Right-sided chest tube in place. Extensive right lower lobe opacification by CT with underlying malignancy. Small or moderate pleural effusion is unchanged. Haziness of the left chest from atelectasis and pleural fluid. No pneumothorax. Stable heart size. IMPRESSION: 1. Unchanged right lower lobe opacification with overlying pleural fluid. 2. Mild atelectasis and pleural fluid at the left base. Electronically Signed   By: Monte Fantasia M.D.   On: 08/14/2019 06:41   Dg Chest Port 1 View  Result Date: 08/13/2019 CLINICAL DATA:  Right-sided chest tube placement for effusion EXAM: PORTABLE CHEST 1 VIEW COMPARISON:  Radiograph 08/12/2019 FINDINGS: Neural placement of a right chest tube with the tip positioned in the mid lung. Interval decrease in the size of the right pleural effusion with residual hazy  opacity in the right lung base likely reflecting combination of atelectasis and possible re-expansion edema. Persistent hazy opacities are seen in the left lung base as well. The left costophrenic sulcus is collimated from view. No visible left effusion. No pneumothorax. Right hilar mass distorts the cardiomediastinal contour. Remaining contours are unchanged from prior. IMPRESSION: 1. Interval decrease in the size of the right pleural effusion status post right chest tube placement. 2. Persistent hazy opacities in the right lung base, likely reflecting combination of atelectasis and possible reexpansion edema. 3. Grossly unchanged right hilar mass. Electronically Signed   By: Lovena Le M.D.   On: 08/13/2019 16:30   Dg Chest Port 1 View  Result Date: 08/12/2019 CLINICAL DATA:  Coughing, shortness of breath EXAM: PORTABLE CHEST 1 VIEW COMPARISON:  Chest radiographs, 08/08/2019, CT chest, 08/07/2019 FINDINGS: Interval increase in a right pleural effusion, now large, with associated atelectasis or consolidation. The heart and mediastinum are predominantly obscured. The left lung is normally aerated. IMPRESSION: Interval increase in a right pleural effusion, now large, with associated atelectasis or consolidation. Findings are consistent with a malignant  effusion associated with a right hilar mass better appreciated by prior CT. Electronically Signed   By: Eddie Candle M.D.   On: 08/12/2019 19:20   Dg Chest Port 1 View  Result Date: 08/08/2019 CLINICAL DATA:  Post thoracentesis ongoing shortness of breath and cough. EXAM: PORTABLE CHEST 1 VIEW COMPARISON:  08/08/2019 and 08/07/2019 FINDINGS: Right hilar and mediastinal mass with right lower lobe consolidation and mass similar to prior study. Likely with persistent pleural effusion. No visible pneumothorax. No significant mediastinal shift. No signs of acute bone finding. Left chest is clear. IMPRESSION: 1. No interval change in the appearance of the chest.  No pneumothorax. 2. Right hilar and mediastinal mass and right lower lobe consolidation are similar to prior study. Postobstructive pneumonia is considered. Electronically Signed   By: Zetta Bills M.D.   On: 08/08/2019 14:19   Dg Chest Port 1 View  Result Date: 08/07/2019 CLINICAL DATA:  Worsening short of breath and hypoxia.  COPD. EXAM: PORTABLE CHEST 1 VIEW COMPARISON:  08/04/2019 FINDINGS: Progression of right lower lobe infiltrate compared to the prior study. Possible right pleural effusion has developed. Left lung remains clear. Negative for heart failure or edema. Atherosclerotic aortic arch. IMPRESSION: Progression of right lower lobe infiltrate and probable right pleural effusion. Probable pneumonia. Given the progression, CT chest with contrast may be helpful for further evaluation. Electronically Signed   By: Franchot Gallo M.D.   On: 08/07/2019 15:44   US Thoracentesis Asp Pleural Space W/img Guide  Result Date: 08/08/2019 INDICATION: Shortness of breath. Newly found right sided lung mass with pleural effusion. Request for diagnostic and therapeutic thoracentesis. EXAM: ULTRASOUND GUIDED RIGHT THORACENTESIS MEDICATIONS: None. COMPLICATIONS: None immediate. PROCEDURE: An ultrasound guided thoracentesis was thoroughly discussed with the patient and questions answered. The benefits, risks, alternatives and complications were also discussed. The patient understands and wishes to proceed with the procedure. Written consent was obtained. Ultrasound was performed to localize and mark an adequate pocket of fluid in the right chest. The area was then prepped and draped in the normal sterile fashion. 1% Lidocaine was used for local anesthesia. Under ultrasound guidance a 6 Fr Safe-T-Centesis catheter was introduced. Thoracentesis was performed. The catheter was removed and a dressing applied. FINDINGS: A total of approximately 700 mL of hazy, pale amber colored fluid was removed. Samples were sent to  the laboratory as requested by the clinical team. IMPRESSION: Successful ultrasound guided right thoracentesis yielding 700 mL of pleural fluid. Read by: Ascencion Dike PA-C Electronically Signed   By: Sandi Mariscal M.D.   On: 08/08/2019 13:44    Lab Data:  CBC: Recent Labs  Lab 08/24/19 1744 08/25/19 0407 08/26/19 0225  WBC 30.2* 31.6* 36.6*  HGB 13.4 13.9 13.5  HCT 42.4 44.7 42.6  MCV 93.8 94.5 94.7  PLT 281 261 250   Basic Metabolic Panel: Recent Labs  Lab 08/24/19 1744 08/25/19 0407 08/26/19 0225  NA 135 138 139  K 3.8 3.7 3.7  CL 99 100 101  CO2 _0 GLUCOSE 139* 120* 105*  BUN _1 CREATININE 0.75 0.68 0.53  CALCIUM 8.2* 8.1* 8.4*   GFR: Estimated Creatinine Clearance: 66.4 mL/min (by C-G formula based on SCr of 0.53 mg/dL). Liver Function Tests: No results for input(s): AST, ALT, ALKPHOS, BILITOT, PROT, ALBUMIN in the last 168 hours. No results for input(s): LIPASE, AMYLASE in the last 168 hours. No results for input(s): AMMONIA in the last 168 hours. Coagulation Profile: No results for input(s):  INR, PROTIME in the last 168 hours. Cardiac Enzymes: No results for input(s): CKTOTAL, CKMB, CKMBINDEX, TROPONINI in the last 168 hours. BNP (last 3 results) No results for input(s): PROBNP in the last 8760 hours. HbA1C: No results for input(s): HGBA1C in the last 72 hours. CBG: Recent Labs  Lab 08/25/19 1916 08/25/19 2316 08/26/19 0359 08/26/19 0813 08/26/19 1129  GLUCAP 120* 124* 113* 127* 140*   Lipid Profile: Recent Labs    08/26/19 0225  CHOL 149  HDL 41  LDLCALC 77  TRIG 153*  CHOLHDL 3.6   Thyroid Function Tests: No results for input(s): TSH, T4TOTAL, FREET4, T3FREE, THYROIDAB in the last 72 hours. Anemia Panel: No results for input(s): VITAMINB12, FOLATE, FERRITIN, TIBC, IRON, RETICCTPCT in the last 72 hours. Urine analysis:    Component Value Date/Time   COLORURINE YELLOW 08/07/2019 1557   APPEARANCEUR HAZY (A) 08/07/2019 1557     LABSPEC 1.016 08/07/2019 1557   PHURINE 6.0 08/07/2019 1557   GLUCOSEU NEGATIVE 08/07/2019 1557   HGBUR SMALL (A) 08/07/2019 1557   BILIRUBINUR NEGATIVE 08/07/2019 1557   KETONESUR 5 (A) 08/07/2019 1557   PROTEINUR NEGATIVE 08/07/2019 1557   NITRITE NEGATIVE 08/07/2019 1557   LEUKOCYTESUR NEGATIVE 08/07/2019 1557     Javien Tesch M.D. [facility] 08/26/2019, 2:03 PM  Pager: [phone] Between 7am to 7pm - call Pager - [phone]  After 7pm go to [email or web address] - password [email or web address]  Call night coverage person covering after 7pm

## 2019-08-26 NOTE — Evaluation (Signed)
Clinical/Bedside Swallow Evaluation Patient Details  Name: Jordan Clay MRN: [id number] Date of Birth: 09-23-1951  Today's Date: 08/26/2019 Time: SLP Start Time (ACUTE ONLY): 1500 SLP Stop Time (ACUTE ONLY): 1520 SLP Time Calculation (min) (ACUTE ONLY): 20 min  Past Medical History:  Past Medical History:  Diagnosis Date  . COPD (chronic obstructive pulmonary disease) ([place])   . High cholesterol   . Pneumonia    Past Surgical History:  Past Surgical History:  Procedure Laterality Date  . BRONCHIAL BRUSHINGS  08/09/2019   Procedure: BRONCHIAL BRUSHINGS;  Surgeon: Collene Gobble, MD;  Location: [facility];  Service: Cardiopulmonary;;  . BRONCHIAL WASHINGS  08/09/2019   Procedure: BRONCHIAL WASHINGS;  Surgeon: Collene Gobble, MD;  Location: [facility];  Service: Cardiopulmonary;;  . ENDOBRONCHIAL ULTRASOUND Bilateral 08/09/2019   Procedure: ENDOBRONCHIAL ULTRASOUND;  Surgeon: Collene Gobble, MD;  Location: [facility];  Service: Cardiopulmonary;  Laterality: Bilateral;  . FINE NEEDLE ASPIRATION BIOPSY  08/09/2019   Procedure: FINE NEEDLE ASPIRATION BIOPSY;  Surgeon: Collene Gobble, MD;  Location: [facility];  Service: Cardiopulmonary;;  . VIDEO BRONCHOSCOPY N/A 08/09/2019   Procedure: VIDEO BRONCHOSCOPY WITH FLUORO;  Surgeon: Collene Gobble, MD;  Location: [facility];  Service: Cardiopulmonary;  Laterality: N/A;   HPI:  68 yo female admitted with shortness of breath. Imaging (+) R lung mass. New diagnosis of stage IV met lung cancer. S/P thoracotomy. Pleurx catheter 10/31. Hx of COPD, 40 pack year smoker.   Assessment / Plan / Recommendation Clinical Impression  Patient presents with what appears to be a pharyngeal or pharyngoesophageal dysphagia secondary to globus sensation, multiple swallows with purees and thin liquids, difficulty/pain with swallowing, which patient reports has been going on for 2-3 weeks prior to this date. SLP observed patient's HR to increase from  102 to as high as 119 during PO intake of puree solids, respiratory rate increased to high 30's/low 40's and SpO2 percentage declined from 91 to 85%. Per MD report, unfortunately patient's prognosis is poor overall. Plan is for SLP to work with patient to determine safest PO's and to consult with medical team for [place]. MBS could potentially aid in PO management, but this may not be beneficial if patient goes a more palliative route. SLP did discuss this with patient, as well as potential need for temporary alternative nutrition as she is at high risk for malnutrition; she did not seem to want an NG tube/[place], but this could be further discussed.   SLP Visit Diagnosis: Dysphagia, unspecified (R13.10)    Aspiration Risk  Moderate aspiration risk    Diet Recommendation Thin liquid(full liquids)   Liquid Administration via: Cup;Straw Medication Administration: Crushed with puree Supervision: Patient able to self feed Compensations: Small sips/bites;Slow rate Postural Changes: Seated upright at 90 degrees    Other  Recommendations Oral Care Recommendations: Oral care BID;Patient independent with oral care   Follow up Recommendations Other (comment)(TBD)      Frequency and Duration min 2x/week  1 week       Prognosis Prognosis for Safe Diet Advancement: Fair Barriers to Reach Goals: Time post onset;Severity of deficits      Swallow Study   General Date of Onset: 08/25/19 HPI: 68 yo female admitted with shortness of breath. Imaging (+) R lung mass. New diagnosis of stage IV met lung cancer. S/P thoracotomy. Pleurx catheter 10/31. Hx of COPD, 40 pack year smoker. Type of Study: Bedside Swallow Evaluation Previous Swallow Assessment: N/A Diet Prior to this Study: NPO Temperature  Spikes Noted: No Respiratory Status: Nasal cannula History of Recent Intubation: No Behavior/Cognition: Cooperative;Pleasant mood;Alert Oral Cavity Assessment: Within Functional Limits Oral Care Completed  by SLP: No Oral Cavity - Dentition: Dentures, top Vision: Functional for self-feeding Self-Feeding Abilities: Able to feed self Patient Positioning: Upright in chair Baseline Vocal Quality: Low vocal intensity Volitional Cough: Weak Volitional Swallow: Able to elicit    Oral/Motor/Sensory Function Overall Oral Motor/Sensory Function: Within functional limits   Ice Chips Ice chips: Not tested   Thin Liquid Thin Liquid: Impaired Presentation: Straw Pharyngeal  Phase Impairments: Multiple swallows    Nectar Thick Nectar Thick Liquid: Not tested   Honey Thick Honey Thick Liquid: Not tested   Puree Puree: Impaired Presentation: Self Fed;Spoon Pharyngeal Phase Impairments: Multiple swallows   Solid     Solid: Not tested     Sonia Baller, MA, CCC-SLP 08/26/19 5:30 PM

## 2019-08-27 LAB — BASIC METABOLIC PANEL
Anion gap: 15 (ref 5–15)
BUN: 17 mg/dL (ref 8–23)
CO2: 25 mmol/L (ref 22–32)
Calcium: 8.3 mg/dL — ABNORMAL LOW (ref 8.9–10.3)
Chloride: 98 mmol/L (ref 98–111)
Creatinine, Ser: 0.55 mg/dL (ref 0.44–1.00)
GFR calc Af Amer: >60 mL/min (ref >60)
GFR calc non Af Amer: >60 mL/min (ref >60)
Glucose, Bld: 106 mg/dL — ABNORMAL HIGH (ref 70–99)
Potassium: 3.8 mmol/L (ref 3.5–5.1)
Sodium: 138 mmol/L (ref 135–145)

## 2019-08-27 LAB — CBC
HCT: 41.4 % (ref 36.0–46.0)
Hemoglobin: 12.7 g/dL (ref 12.0–15.0)
MCH: 28.9 pg (ref 26.0–34.0)
MCHC: 30.7 g/dL (ref 30.0–36.0)
MCV: 94.3 fL (ref 80.0–100.0)
Platelets: 197 10*3/uL (ref 150–400)
RBC: 4.39 MIL/uL (ref 3.87–5.11)
RDW: 13.4 % (ref 11.5–15.5)
WBC: 36.5 10*3/uL — ABNORMAL HIGH (ref 4.0–10.5)
nRBC: 0.1 % (ref 0.0–0.2)

## 2019-08-27 LAB — GLUCOSE, CAPILLARY
Glucose-Capillary: 102 mg/dL — ABNORMAL HIGH (ref 70–99)
Glucose-Capillary: 104 mg/dL — ABNORMAL HIGH (ref 70–99)
Glucose-Capillary: 127 mg/dL — ABNORMAL HIGH (ref 70–99)
Glucose-Capillary: 96 mg/dL (ref 70–99)
Glucose-Capillary: 104 mg/dL — ABNORMAL HIGH (ref 70–99)

## 2019-08-27 MED ORDER — MIDODRINE HCL 5 MG PO TABS
5.0000 mg | ORAL_TABLET | Freq: Two times a day (BID) | ORAL | Status: DC
  Administered 2019-08-27 – 2019-08-28 (×3): 5 mg via ORAL
  Filled 2019-08-27 (×10): qty 1

## 2019-08-27 MED ORDER — BOOST / RESOURCE BREEZE PO LIQD CUSTOM
1.0000 | Freq: Three times a day (TID) | ORAL | Status: DC
  Administered 2019-08-27: 1 via ORAL

## 2019-08-27 NOTE — Progress Notes (Signed)
[facility]                                                                              Patient Demographics  Jordan Clay, is a 68 y.o. female, DOB - 03-Jul-1951, LZJ:[id number]  Admit date - 08/07/2019   Admitting Physician Vianne Bulls, MD  Outpatient Primary MD for the patient is Hulan Fess, MD  Outpatient specialists:   LOS - 20  days   Medical records reviewed and are as summarized below:    Chief Complaint  Patient presents with   COPD   low sats       Brief summary   Patient is a 68 year old female with 40 pack year smoking history,COPD who was recently treated for pneumonia by her PCP a week prior to admission,hyperlipidemia presented to [facility] on 10/19 with reports of 2 weeks of cough &SOB. CTA chest revealed obstruction of the central RLL bronchus with associated postobstructive PNA &pleural effusion. She underwent thoracentesis on 10/20 by IR with pleural fluid consistent with metastatic adenocarcinoma. PCCM was consulted and patient underwent EBUS on 10/21 with pathology consistent with non-small cell carcinoma. Oncology, Dr Alen Blew, was consulted - not felt to be a candidate for systemic chemotherapy currently.  subsequently rad-onc involved with plan for 2 week XRT starting 08/14/19. Patient however was transferred to SDU for hypoxic resp failure on 10/24 requiring right sided chest tube placement--subsequently converted to pleurx catheter. Patient was requiring 20 lits High flow 02 until last week but improving now and down to 3lits [place] on 11/3. Patient received abx: Rocephin/Azithro 10/19 >> 10/24 and Unasyn 10/26 >> 10/31. COVID -ve, blood cx and pleural fluid/BAL cx -ve so far. Patient completed 7/14 radiation treatments. Transfer to hospitalist service on 11/5.  Assessment & Plan    Principal Problem: Acute respiratory failure with hypoxia secondary to lung CA/obstructed airway/postobstructive pneumonia -CTA chest revealed obstruction  of the central RLL bronchus with associated postobstructive PNA and pleural effusion.  COVID-19 test negative -Underwent thoracentesis on 10/20 with pleural fluid consistent with metastatic adeno CA -PCCM was consulted, underwent EBUS on 10/21, pathology consistent with non-small cell carcinoma -Status post right Pleurx catheter placement -Received Rocephin/Azithro 10/19 >> 10/24 and Unasyn 10/26 > 10/31 -Discussed with pulmonology, Dr. 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  Currently significant coughing, shortness of breath with minimal exertion, poor functional status.  Requested palliative care consult for goals of care and symptom management -Currently on Tessalon Perles, Tussionex scheduled for cough, remains on IV Solu-Medrol, duo nebs  Acute on chronic hypotension -Patient had systolic BP drop to around 70s on 11/5 has baseline low BP. -Avoid too frequent or large volume Pleurx catheter drainage -Hypotension limiting physical therapy and Pleurx drainage, added low-dose midodrine 5 mg twice daily  Hyperlipidemia LDL 77, cholesterol 149, triglycerides 153  Urinary retention Patient was noted to have urinary retention on  11/5, continue Foley  Leukocytosis In the setting of steroids, not spiking any fevers  Malnutrition of moderate degree. -BMI 25.3. -SLP evaluation done, reviewed recommendations.  Continue full liquids, will add nutritional supplements - also awaiting palliative goals of care  Code Status: DNR DVT Prophylaxis:  Lovenox  Family Communication: Discussed all imaging results, lab results, explained to the patient    Disposition Plan: Discussed with pulmonology, Dr.  Chase Caller, recommended transfer to telemetry floor  Time Spent in minutes 25 minutes Procedures:    Consultants:   Pulmonology Oncology Palliative medicine  Antimicrobials:   Anti-infectives (From admission, onward)   Start     Dose/Rate Route Frequency Ordered Stop   08/14/19 1100  Ampicillin-Sulbactam (UNASYN) 3 g in sodium chloride 0.9 % 100 mL IVPB     3 g 200 mL/hr over 30 Minutes Intravenous Every 6 hours 08/14/19 1026 08/19/19 0556   08/10/19 1200  cefTRIAXone (ROCEPHIN) 2 g in sodium chloride 0.9 % 100 mL IVPB     2 g 200 mL/hr over 30 Minutes Intravenous Every 24 hours 08/10/19 1043 08/13/19 1355   08/08/19 1600  cefTRIAXone (ROCEPHIN) 2 g in sodium chloride 0.9 % 100 mL IVPB  Status:  Discontinued     2 g 200 mL/hr over 30 Minutes Intravenous Every 24 hours 08/07/19 2043 08/10/19 0723   08/08/19 1600  azithromycin (ZITHROMAX) 500 mg in sodium chloride 0.9 % 250 mL IVPB     500 mg 250 mL/hr over 60 Minutes Intravenous Every 24 hours 08/07/19 2043 08/12/19 1844   08/07/19 1600  cefTRIAXone (ROCEPHIN) 1 g in sodium chloride 0.9 % 100 mL IVPB     1 g 200 mL/hr over 30 Minutes Intravenous  Once 08/07/19 1556 08/07/19 1910   08/07/19 1600  azithromycin (ZITHROMAX) 500 mg in sodium chloride 0.9 % 250 mL IVPB     500 mg 250 mL/hr over 60 Minutes Intravenous  Once 08/07/19 1556 08/07/19 2012         Medications  Scheduled Meds:  benzonatate  200 mg Oral TID   Chlorhexidine Gluconate Cloth  6 each Topical Daily   chlorpheniramine-HYDROcodone  5 mL Oral Q6H   enoxaparin (LOVENOX) injection  40 mg Subcutaneous Q24H   feeding supplement (ENSURE ENLIVE)  237 mL Oral BID BM   gabapentin  100 mg Oral TID   hydrocortisone cream   Topical BID   insulin aspart  0-9 Units Subcutaneous Q4H   ipratropium-albuterol  3 mL Nebulization TID   mouth rinse  15 mL Mouth Rinse BID   methylPREDNISolone (SOLU-MEDROL) injection  40 mg Intravenous Q24H   midodrine  5 mg Oral  BID WC   pantoprazole  40 mg Oral BID AC   polyethylene glycol  17 g Oral BID   ramelteon  8 mg Oral QHS   sodium chloride flush  3 mL Intravenous Q12H   Continuous Infusions: PRN Meds:.acetaminophen **OR** acetaminophen, albuterol, bisacodyl, hydrOXYzine, menthol-cetylpyridinium, menthol-cetylpyridinium, morphine injection, oxyCODONE, phenol, senna-docusate, temazepam      Subjective:   Lurene Robley was seen and examined today.  No acute issues overnight.  Still on 3 L high flow, shortness of breath with minimal exertion.  No significant coughing this morning.  No fevers.  No chest pain, abdominal pain, nausea vomiting  Objective:   Vitals:   08/27/19 1006 08/27/19 1122 08/27/19 1249 08/27/19 1250  BP: (!) 88/62 (!) 87/60 94/62 (!) 90/58  Pulse: (!) 109 (!) 110 (!) 106 98  Resp: 18 18  18  Temp: 97.8 F (36.6 C) 98 F (36.7 C) 98.4 F (36.9 C) 98.4 F (36.9 C)  TempSrc: Oral Oral Oral Oral  SpO2: 96% 96% 93% 95%  Weight:      Height:        Intake/Output Summary (Last 24 hours) at 08/27/2019 1256 Last data filed at 08/26/2019 1600 Gross per 24 hour  Intake 240 ml  Output 350 ml  Net -110 ml     Wt Readings from Last 3 Encounters:  08/21/19 73.4 kg  09/27/18 67.5 kg   Physical Exam  General: Alert and oriented x 3, sick appearing  Eyes:   HEENT:  Atraumatic, normocephalic  Cardiovascular: S1 S2 clear, no murmurs, RRR. No pedal edema b/l  Respiratory: Decreased breath sound at the bases, Pleurx catheter  Gastrointestinal: Soft, nontender, nondistended, NBS  Ext: no pedal edema bilaterally  Neuro: no new deficits  Musculoskeletal: No cyanosis, clubbing  Skin: Macular rash along the right scapular area to the right axilla  Psych: Normal affect and demeanor, alert and oriented x3     Data Reviewed:  I have personally reviewed following labs and imaging studies  Micro Results No results found for this or any previous visit (from the past 240  hour(s)).  Radiology Reports Dg Chest 1 View  Result Date: 08/21/2019 CLINICAL DATA:  Malignant pleural effusion EXAM: CHEST  1 VIEW COMPARISON:  Three days ago FINDINGS: Tunneled pleural catheter has than advanced with a more vertical appearance. Right pleural effusion has decreased mildly, with presumed ex vacuo pneumothorax at the apex where there was pleural fluid. Pulmonary opacity on both sides. Mediastinal enlargement from bulky adenopathy. IMPRESSION: Advanced right pleural catheter. There is decreased right pleural fluid but new, likely ex vacuo pneumothorax at the apex. Stable bilateral airspace disease. Electronically Signed   By: Monte Fantasia M.D.   On: 08/21/2019 05:06   Dg Chest 1 View  Result Date: 08/08/2019 CLINICAL DATA:  Post right thoracentesis EXAM: CHEST  1 VIEW COMPARISON:  08/07/2019 FINDINGS: Heart is normal size. Consolidation in the right lower lung with right pleural effusion. No pneumothorax following thoracentesis. Left lung clear. No acute bony abnormality. Heart is normal size. IMPRESSION: Continued right lower lung airspace opacity with small to moderate right effusion. No pneumothorax following thoracentesis. Electronically Signed   By: Rolm Baptise M.D.   On: 08/08/2019 10:26   Ct Chest Wo Contrast  Result Date: 08/19/2019 CLINICAL DATA:  Non-small cell lung cancer, evaluate PleurX catheter placement and response to radiation EXAM: CT CHEST WITHOUT CONTRAST TECHNIQUE: Multidetector CT imaging of the chest was performed following the standard protocol without IV contrast. COMPARISON:  CT chest angiogram, 08/07/2019 FINDINGS: Cardiovascular: Aortic atherosclerosis. Normal heart size. Trace pericardial effusion. Mediastinum/Nodes: Very bulky superior mediastinal, mediastinal, and right hilar lymphadenopathy is increased in bulk compared to prior examination, the largest pretracheal lymph node that can be discretely measured measures 3.3 x 3.0 cm, previously 3.1 x 2.5  cm when measured similarly (series 2, image 55). There has particularly been an increase in bulk of matted superior mediastinal lymph nodes (series 2, image 23). Thyroid gland, trachea, and esophagus demonstrate no significant findings. Lungs/Pleura: There has been interval placement of a right-sided tunneled pleural drainage catheter, which is looped around the anterior pleural space, traversing the minor fissure and epicardial fat lobulations, tip positioned about the superolateral right pleural space. There is a moderate volume right hydropneumothorax with a small right pneumothorax component and moderate pleural effusion. There is a small left pleural  effusion. Diffuse bilateral interlobular septal thickening, ground-glass opacity and some evidence of pulmonary nodularity, for example in the right pulmonary apex new nodules measuring up to 1.0 cm (series 5, image 26). This mild underlying emphysema. There has been increase in dense, masslike postobstructive consolidation of the right lung, with total consolidation of the right middle and right lower lobes. Perihilar mass cannot be discerned from adjacent consolidated lung on noncontrast examination. Upper Abdomen: No acute abnormality. Enlarged gastrohepatic lymph nodes in the upper abdomen, increased in size compared to prior examination, measuring up to 2.0 x 1.6 cm, previously 1.7 x 1.1 cm (series 2, image 140). Musculoskeletal: No chest wall mass or suspicious bone lesions identified. IMPRESSION: 1. There has been interval placement of a right-sided tunneled pleural drainage catheter, which is looped around the anterior pleural space, traversing the minor fissure and epicardial fat lobulations, tip positioned about the superolateral right pleural space. There is a moderate volume right hydropneumothorax with a small right pneumothorax component and moderate pleural effusion. There is a small left pleural effusion. 2. There has been increase in dense,  masslike postobstructive consolidation of the right lung, with total consolidation of the right middle and right lower lobes. Perihilar mass cannot be discerned from adjacent consolidated lung on noncontrast examination. 3. Diffuse bilateral interlobular septal thickening, ground-glass opacity and some evidence of pulmonary nodularity, for example in the right pulmonary apex new nodules measuring up to 1.0 cm (series 5, image 26). These findings may reflect a component of edema but are very concerning for lymphangitic spread of metastatic disease. 4. Very bulky superior mediastinal, mediastinal, and right hilar lymphadenopathy is increased in bulk compared to prior examination, the largest pretracheal lymph node that can be discretely measured measures 3.3 x 3.0 cm, previously 3.1 x 2.5 cm when measured similarly (series 2, image 55). There has particularly been an increase in bulk of matted superior mediastinal lymph nodes (series 2, image 23). 5. Enlarged gastrohepatic lymph nodes in the upper abdomen, increased in size compared to prior examination, measuring up to 2.0 x 1.6 cm, previously 1.7 x 1.1 cm (series 2, image 140). 6.  Emphysema (ICD10-J43.9).  Aortic Atherosclerosis (ICD10-I70.0). Electronically Signed   By: Eddie Candle M.D.   On: 08/19/2019 13:48   Ct Angio Chest Pe W/cm &/or Wo Cm  Result Date: 08/07/2019 CLINICAL DATA:  Shortness of breath and hypoxia. Recent pneumonia. EXAM: CT ANGIOGRAPHY CHEST WITH CONTRAST TECHNIQUE: Multidetector CT imaging of the chest was performed using the standard protocol during bolus administration of intravenous contrast. Multiplanar CT image reconstructions and MIPs were obtained to evaluate the vascular anatomy. CONTRAST:  139m OMNIPAQUE IOHEXOL 350 MG/ML SOLN COMPARISON:  06/10/2018 FINDINGS: Cardiovascular: Satisfactory opacification of pulmonary arteries noted, and no pulmonary emboli identified. No evidence of thoracic aortic dissection or aneurysm. Aortic  atherosclerosis. Mediastinum/Nodes: New bulky lymphadenopathy is seen throughout the right paratracheal and subcarinal regions, with subcarinal soft tissue density measuring up to 4.2 cm short axis. Right hilar lymphadenopathy is also seen measuring at least 2.2 cm in short axis. Obstruction of the central right lower lobe bronchus and central low-attenuation is suspicious for centrally obstructing mass. Lungs/Pleura: There is near complete right lower lobe consolidation, suspicious for postobstructive pneumonitis. A moderate right pleural effusion is also seen. Several bilateral upper lobe and right middle lobe pulmonary nodules are seen measuring up to 5 mm which are stable. Mild emphysema again noted.33 Upper abdomen: No acute findings. Musculoskeletal: No suspicious bone lesions identified. Review of the MIP images confirms  the above findings. IMPRESSION: No evidence of pulmonary embolism. Obstruction of central right lower lobe bronchus and central low-attenuation, suspicious for centrally obstructing mass. Consider further evaluation with bronchoscopy. Near complete right lower lobe consolidation, suspicious for postobstructive pneumonitis. Moderate right pleural effusion. Consider diagnostic thoracentesis. New bulky mediastinal and right hilar lymphadenopathy, consistent with metastatic disease. Stable sub-cm bilateral upper lobe and right middle lobe pulmonary nodules. Aortic Atherosclerosis (ICD10-I70.0) and Emphysema (ICD10-J43.9). Electronically Signed   By: Marlaine Hind M.D.   On: 08/07/2019 19:23   Ct Abdomen Pelvis W Contrast  Result Date: 08/09/2019 CLINICAL DATA:  Newly diagnosed right lung carcinoma. Staging. EXAM: CT ABDOMEN AND PELVIS WITH CONTRAST TECHNIQUE: Multidetector CT imaging of the abdomen and pelvis was performed using the standard protocol following bolus administration of intravenous contrast. CONTRAST:  175m OMNIPAQUE IOHEXOL 300 MG/ML SOLN, 371mOMNIPAQUE IOHEXOL 300 MG/ML SOLN  COMPARISON:  Chest only CTA on 08/07/2019; no prior AP CT FINDINGS: Lower Chest: Mediastinal and right hilar lymphadenopathy, bilateral pleural effusions, and right basilar consolidation, as better demonstrated on recent chest CT. Hepatobiliary: No hepatic masses identified. Pancreas:  No mass or inflammatory changes. Spleen: Within normal limits in size and appearance. Adrenals/Urinary Tract: No masses identified. No evidence of hydronephrosis. Stomach/Bowel: No evidence of obstruction, inflammatory process or abnormal fluid collections. Normal appendix visualized. Vascular/Lymphatic: Mild upper lymphadenopathy is seen in the gastrohepatic ligament, with largest lymph node measuring 1.3 cm short axis. A 10 mm retroperitoneal lymph node is seen in the left paraaortic region. No lymphadenopathy identified within the pelvis. No abdominal aortic aneurysm. Aortic atherosclerosis. Reproductive:  No mass or other significant abnormality. Other:  None. Musculoskeletal:  No suspicious bone lesions identified. IMPRESSION: Mild upper abdominal lymphadenopathy in gastrohepatic ligament and left paraaortic region, consistent with metastatic disease. No other sites of metastatic disease identified within the abdomen or pelvis. Electronically Signed   By: JoMarlaine Hind.D.   On: 08/09/2019 18:28   Dg Chest Port 1 View  Result Date: 08/25/2019 CLINICAL DATA:  Increased shortness of breath. EXAM: PORTABLE CHEST 1 VIEW COMPARISON:  Chest x-ray 08/21/2019. FINDINGS: Right pleural catheter again noted with tip over the lateral aspect of the right upper chest. Unchanged large right pleural effusion. Unchanged right lung infiltrate. Persistent left base atelectasis. No pneumothorax. Stable cardiomegaly. No acute bony abnormality. IMPRESSION: 1. Right pleural catheter again noted with tip over the lateral aspect of the right upper chest. Unchanged large right pleural effusion. No pneumothorax. 2. Unchanged right lung infiltrate.  Persistent left base atelectasis. 3.  Stable cardiomegaly. Electronically Signed   By: ThMarcello MooresRegister   On: 08/25/2019 07:37   Dg Chest Port 1 View  Result Date: 08/19/2019 CLINICAL DATA:  Postobstructive pneumonia. Ex-smoker. Follow-up right pneumothorax. EXAM: PORTABLE CHEST 1 VIEW COMPARISON:  08/16/2019 FINDINGS: The previously demonstrated small right pneumothorax is no longer demonstrated with a chest tube remaining in place. There is increased pleural fluid on the right with a moderate to large sized pleural effusion currently demonstrated. There is less patchy opacity in the right lung. The interstitial markings in the left lung remain prominent. The cardiac silhouette remains borderline enlarged. Unremarkable bones. IMPRESSION: 1. Resolved small right pneumothorax. 2. Increased right pleural fluid with a moderate to large sized pleural effusion currently demonstrated. 3. Decreased patchy atelectasis or pneumonia in the right lung. 4. Stable changes of COPD with chronic interstitial lung disease and possible interstitial pneumonitis. Electronically Signed   By: StClaudie Revering.D.   On: 08/19/2019 06:06  Dg Chest Port 1 View  Result Date: 08/16/2019 CLINICAL DATA:  68 year old female with chest tube placement EXAM: PORTABLE CHEST 1 VIEW COMPARISON:  Multiple prior chest x-ray most recent 08/15/2019 FINDINGS: Cardiomediastinal silhouette unchanged in size and contour, with the heart borders obscured by overlying lung/pleural disease. Similar appearance of opacity in the right suprahilar region along the mediastinal border. Dense opacity at the right lower lung, with a gradient of reticulonodular opacities decreasing towards the upper lung. Interval development of small right pneumothorax and right chest wall myofacial/subcutaneous gas. Unchanged right thoracostomy tube. Reticular opacities of the left lung, similar to the prior. IMPRESSION: Unchanged right thoracostomy tube with interval  development of small right pneumothorax, as well as subcutaneous/myofacial gas of the right chest wall. Unchanged right basilar opacity likely a combination of consolidation/atelectasis, tumor, and/or residual pleural fluid. Reticular opacities bilaterally suggesting increasing edema. Electronically Signed   By: Corrie Mckusick D.O.   On: 08/16/2019 07:55   Dg Chest Port 1 View  Result Date: 08/15/2019 CLINICAL DATA:  Respiratory failure. EXAM: PORTABLE CHEST 1 VIEW COMPARISON:  August 14, 2019. FINDINGS: Stable cardiomediastinal silhouette. Right-sided chest tube is noted without pneumothorax. Stable right lung opacity is noted with associated pleural effusion. Stable left lung opacities are noted as well. Atherosclerosis of thoracic aorta is noted. No pneumothorax. Bony thorax is unremarkable. IMPRESSION: Stable right lung opacity with associated pleural effusion. Stable right-sided chest tube without pneumothorax. Stable left lung opacities are noted as well. Aortic Atherosclerosis (ICD10-I70.0). Electronically Signed   By: Marijo Conception M.D.   On: 08/15/2019 07:42   Dg Chest Port 1 View  Result Date: 08/14/2019 CLINICAL DATA:  Pleural effusion on the right EXAM: PORTABLE CHEST 1 VIEW COMPARISON:  Yesterday FINDINGS: Right-sided chest tube in place. Extensive right lower lobe opacification by CT with underlying malignancy. Small or moderate pleural effusion is unchanged. Haziness of the left chest from atelectasis and pleural fluid. No pneumothorax. Stable heart size. IMPRESSION: 1. Unchanged right lower lobe opacification with overlying pleural fluid. 2. Mild atelectasis and pleural fluid at the left base. Electronically Signed   By: Monte Fantasia M.D.   On: 08/14/2019 06:41   Dg Chest Port 1 View  Result Date: 08/13/2019 CLINICAL DATA:  Right-sided chest tube placement for effusion EXAM: PORTABLE CHEST 1 VIEW COMPARISON:  Radiograph 08/12/2019 FINDINGS: Neural placement of a right chest tube  with the tip positioned in the mid lung. Interval decrease in the size of the right pleural effusion with residual hazy opacity in the right lung base likely reflecting combination of atelectasis and possible re-expansion edema. Persistent hazy opacities are seen in the left lung base as well. The left costophrenic sulcus is collimated from view. No visible left effusion. No pneumothorax. Right hilar mass distorts the cardiomediastinal contour. Remaining contours are unchanged from prior. IMPRESSION: 1. Interval decrease in the size of the right pleural effusion status post right chest tube placement. 2. Persistent hazy opacities in the right lung base, likely reflecting combination of atelectasis and possible reexpansion edema. 3. Grossly unchanged right hilar mass. Electronically Signed   By: Lovena Le M.D.   On: 08/13/2019 16:30   Dg Chest Port 1 View  Result Date: 08/12/2019 CLINICAL DATA:  Coughing, shortness of breath EXAM: PORTABLE CHEST 1 VIEW COMPARISON:  Chest radiographs, 08/08/2019, CT chest, 08/07/2019 FINDINGS: Interval increase in a right pleural effusion, now large, with associated atelectasis or consolidation. The heart and mediastinum are predominantly obscured. The left lung is normally aerated. IMPRESSION:  Interval increase in a right pleural effusion, now large, with associated atelectasis or consolidation. Findings are consistent with a malignant effusion associated with a right hilar mass better appreciated by prior CT. Electronically Signed   By: Eddie Candle M.D.   On: 08/12/2019 19:20   Dg Chest Port 1 View  Result Date: 08/08/2019 CLINICAL DATA:  Post thoracentesis ongoing shortness of breath and cough. EXAM: PORTABLE CHEST 1 VIEW COMPARISON:  08/08/2019 and 08/07/2019 FINDINGS: Right hilar and mediastinal mass with right lower lobe consolidation and mass similar to prior study. Likely with persistent pleural effusion. No visible pneumothorax. No significant mediastinal shift.  No signs of acute bone finding. Left chest is clear. IMPRESSION: 1. No interval change in the appearance of the chest. No pneumothorax. 2. Right hilar and mediastinal mass and right lower lobe consolidation are similar to prior study. Postobstructive pneumonia is considered. Electronically Signed   By: Zetta Bills M.D.   On: 08/08/2019 14:19   Dg Chest Port 1 View  Result Date: 08/07/2019 CLINICAL DATA:  Worsening short of breath and hypoxia.  COPD. EXAM: PORTABLE CHEST 1 VIEW COMPARISON:  08/04/2019 FINDINGS: Progression of right lower lobe infiltrate compared to the prior study. Possible right pleural effusion has developed. Left lung remains clear. Negative for heart failure or edema. Atherosclerotic aortic arch. IMPRESSION: Progression of right lower lobe infiltrate and probable right pleural effusion. Probable pneumonia. Given the progression, CT chest with contrast may be helpful for further evaluation. Electronically Signed   By: Franchot Gallo M.D.   On: 08/07/2019 15:44   US Thoracentesis Asp Pleural Space W/img Guide  Result Date: 08/08/2019 INDICATION: Shortness of breath. Newly found right sided lung mass with pleural effusion. Request for diagnostic and therapeutic thoracentesis. EXAM: ULTRASOUND GUIDED RIGHT THORACENTESIS MEDICATIONS: None. COMPLICATIONS: None immediate. PROCEDURE: An ultrasound guided thoracentesis was thoroughly discussed with the patient and questions answered. The benefits, risks, alternatives and complications were also discussed. The patient understands and wishes to proceed with the procedure. Written consent was obtained. Ultrasound was performed to localize and mark an adequate pocket of fluid in the right chest. The area was then prepped and draped in the normal sterile fashion. 1% Lidocaine was used for local anesthesia. Under ultrasound guidance a 6 Fr Safe-T-Centesis catheter was introduced. Thoracentesis was performed. The catheter was removed and a dressing  applied. FINDINGS: A total of approximately 700 mL of hazy, pale amber colored fluid was removed. Samples were sent to the laboratory as requested by the clinical team. IMPRESSION: Successful ultrasound guided right thoracentesis yielding 700 mL of pleural fluid. Read by: Ascencion Dike PA-C Electronically Signed   By: Sandi Mariscal M.D.   On: 08/08/2019 13:44    Lab Data:  CBC: Recent Labs  Lab 08/24/19 1744 08/25/19 0407 08/26/19 0225 08/27/19 0236  WBC 30.2* 31.6* 36.6* 36.5*  HGB 13.4 13.9 13.5 12.7  HCT 42.4 44.7 42.6 41.4  MCV 93.8 94.5 94.7 94.3  PLT 281 261 228 916   Basic Metabolic Panel: Recent Labs  Lab 08/24/19 1744 08/25/19 0407 08/26/19 0225 08/27/19 0236  NA 135 138 139 138  K 3.8 3.7 3.7 3.8  CL 99 100 101 98  CO2 _0 GLUCOSE 139* 120* 105* 106*  BUN _1 CREATININE 0.75 0.68 0.53 0.55  CALCIUM 8.2* 8.1* 8.4* 8.3*   GFR: Estimated Creatinine Clearance: 66.4 mL/min (by C-G formula based on SCr of 0.55 mg/dL). Liver Function Tests: No results for input(s): AST,  ALT, ALKPHOS, BILITOT, PROT, ALBUMIN in the last 168 hours. No results for input(s): LIPASE, AMYLASE in the last 168 hours. No results for input(s): AMMONIA in the last 168 hours. Coagulation Profile: No results for input(s): INR, PROTIME in the last 168 hours. Cardiac Enzymes: No results for input(s): CKTOTAL, CKMB, CKMBINDEX, TROPONINI in the last 168 hours. BNP (last 3 results) No results for input(s): PROBNP in the last 8760 hours. HbA1C: No results for input(s): HGBA1C in the last 72 hours. CBG: Recent Labs  Lab 08/26/19 1559 08/26/19 1939 08/26/19 2308 08/27/19 0318 08/27/19 1116  GLUCAP 140* 129* 106* 102* 127*   Lipid Profile: Recent Labs    08/26/19 0225  CHOL 149  HDL 41  LDLCALC 77  TRIG 153*  CHOLHDL 3.6   Thyroid Function Tests: No results for input(s): TSH, T4TOTAL, FREET4, T3FREE, THYROIDAB in the last 72 hours. Anemia Panel: No results for  input(s): VITAMINB12, FOLATE, FERRITIN, TIBC, IRON, RETICCTPCT in the last 72 hours. Urine analysis:    Component Value Date/Time   COLORURINE YELLOW 08/07/2019 1557   APPEARANCEUR HAZY (A) 08/07/2019 1557   LABSPEC 1.016 08/07/2019 1557   PHURINE 6.0 08/07/2019 1557   GLUCOSEU NEGATIVE 08/07/2019 1557   HGBUR SMALL (A) 08/07/2019 1557   BILIRUBINUR NEGATIVE 08/07/2019 1557   KETONESUR 5 (A) 08/07/2019 1557   PROTEINUR NEGATIVE 08/07/2019 1557   NITRITE NEGATIVE 08/07/2019 1557   LEUKOCYTESUR NEGATIVE 08/07/2019 1557     Jacob Chamblee M.D. [facility] 08/27/2019, 12:56 PM  Pager: [phone] Between 7am to 7pm - call Pager - [phone]  After 7pm go to [email or web address] - password [email or web address]  Call night coverage person covering after 7pm

## 2019-08-27 NOTE — Progress Notes (Signed)
Daily Progress Note   Patient Name: Jordan Clay       Date: 08/27/2019 DOB: 1950-11-04  Age: 68 y.o. MRN#: [id number] Attending Physician: Mendel Corning, MD Primary Care Physician: Hulan Fess, MD Admit Date: 08/07/2019  Reason for Consultation/Follow-up: Establishing goals of care and symptom management (cough)  Subjective: Patient is awake alert resting in bedside chair. Focus today was on reviewing her overall condition and seriousness of her illness.  She questioned next steps moving forward for care plan and we discussed goals of radiation and possibility of further systemic therapy.  Length of Stay: 20  Current Medications: Scheduled Meds:  . benzonatate  200 mg Oral TID  . Chlorhexidine Gluconate Cloth  6 each Topical Daily  . chlorpheniramine-HYDROcodone  5 mL Oral Q6H  . enoxaparin (LOVENOX) injection  40 mg Subcutaneous Q24H  . feeding supplement  1 Container Oral TID WC  . feeding supplement (ENSURE ENLIVE)  237 mL Oral BID BM  . gabapentin  100 mg Oral TID  . hydrocortisone cream   Topical BID  . insulin aspart  0-9 Units Subcutaneous Q4H  . ipratropium-albuterol  3 mL Nebulization TID  . mouth rinse  15 mL Mouth Rinse BID  . methylPREDNISolone (SOLU-MEDROL) injection  40 mg Intravenous Q24H  . midodrine  5 mg Oral BID WC  . pantoprazole  40 mg Oral BID AC  . polyethylene glycol  17 g Oral BID  . ramelteon  8 mg Oral QHS  . sodium chloride flush  3 mL Intravenous Q12H    Continuous Infusions:   PRN Meds: acetaminophen **OR** acetaminophen, albuterol, bisacodyl, hydrOXYzine, menthol-cetylpyridinium, menthol-cetylpyridinium, morphine injection, oxyCODONE, phenol, senna-docusate, temazepam  Physical Exam         Awake alert Diminished breath sounds R  Abdomen is not tender No edema Has muscle wasting On 20L and 55%, still with high O2 requirements.  Vital Signs: BP 95/63 (BP Location: Right Arm)   Pulse (!) 110   Temp 98.1 F (36.7 C) (Oral)   Resp 18   Ht 5\' 7"  (1.702 m)   Wt 73.4 kg   SpO2 93%   BMI 25.34 kg/m  SpO2: SpO2: 93 % O2 Device: O2 Device: Nasal Cannula O2 Flow Rate: O2 Flow Rate (L/min): 5 L/min  Intake/output summary:   Intake/Output Summary (Last 24  hours) at 08/27/2019 2207 Last data filed at 08/27/2019 1501 Gross per 24 hour  Intake 480 ml  Output 325 ml  Net 155 ml   LBM: Last BM Date: 08/23/19 Baseline Weight: Weight: 70.3 kg Most recent weight: Weight: 73.4 kg       Palliative Assessment/Data:    Flowsheet Rows     Most Recent Value  Intake Tab  Referral Department  Critical care  Unit at Time of Referral  ICU  Palliative Care Primary Diagnosis  Cancer  Date Notified  08/14/19  Palliative Care Type  New Palliative care  Reason for referral  Clarify Goals of Care  Date of Admission  08/07/19  Date first seen by Palliative Care  08/15/19  # of days Palliative referral response time  1 Day(s)  # of days IP prior to Palliative referral  7  Clinical Assessment  Psychosocial & Spiritual Assessment  Palliative Care Outcomes      Patient Active Problem List   Diagnosis Date Noted  . Malnutrition of moderate degree 08/24/2019  . Stage IV adenocarcinoma of lung, right ([place]) 08/14/2019  . Acute respiratory failure with hypoxemia ([place]) 08/12/2019  . SOB (shortness of breath)   . Postobstructive pneumonia 08/07/2019  . Pleural effusion on right 08/07/2019  . Prolonged QT interval 08/07/2019  . Mass of right lung   . Hyperlipidemia 09/27/2018  . Tobacco abuse 09/27/2018  . COPD (chronic obstructive pulmonary disease) ([place]) 09/27/2018  . Palpitations 09/27/2018    Palliative Care Assessment & Plan   Patient Profile:  Acute hypoxemic respiratory failure secondary to metastatic lung  adenocarcinoma, severe deconditioning, emphysema, malignant right effusion and relative protein calorie malnutrition   Assessment:  frailty deconditioning Shortness of breath Anxiety   Recommendations/Plan: - Reports tussionex has been most helpful agent for cough.  Continue tessalon as well. - Discussion today regarding long term goals as well as severity of her illness.  She would like further input from oncology regarding possible care pathways and prognosis.  I discussed that she would be appropriate for hospice when she is not likely to benefit from further disease modifying therapy. - Patient wishes to continue with current mode of care, she is in full understanding of the severity of her illness.   Code Status:    Code Status Orders  (From admission, onward)         Start     Ordered   08/15/19 0942  Do not attempt resuscitation (DNR)  Continuous    Question Answer Comment  In the event of cardiac or respiratory ARREST Do not call a "code blue"   In the event of cardiac or respiratory ARREST Do not perform Intubation, CPR, defibrillation or ACLS   In the event of cardiac or respiratory ARREST Use medication by any route, position, wound care, and other measures to relive pain and suffering. May use oxygen, suction and manual treatment of airway obstruction as needed for comfort.      08/15/19 0941        Code Status History    Date Active Date Inactive Code Status Order ID Comments User Context   08/07/2019 2044 08/15/2019 0941 Full Code [id number]  Vianne Bulls, MD Inpatient   Advance Care Planning Activity    Advance Directive Documentation     Most Recent Value  Type of Advance Directive  Living will  Pre-existing out of facility DNR order (yellow form or pink MOST form)  -  "MOST" Form in Place?  -  Prognosis:   guarded   Discharge Planning:  To Be Determined  Care plan was discussed with  Patient and bedside RN.   Thank you for allowing the  Palliative Medicine Team to assist in the care of this patient.   Time In: 1630 Time Out: 1715 Total Time 45 Prolonged Time Billed  no       Greater than 50%  of this time was spent counseling and coordinating care related to the above assessment and plan.  Micheline Rough, MD  Please contact Palliative Medicine Team phone at [phone] for questions and concerns.

## 2019-08-27 NOTE — Progress Notes (Signed)
Report called to receiving RN 1401. Patient with no complaints at the current time. Will transfer via bed.

## 2019-08-28 DIAGNOSIS — C3490 Malignant neoplasm of unspecified part of unspecified bronchus or lung: Secondary | ICD-10-CM

## 2019-08-28 DIAGNOSIS — J410 Simple chronic bronchitis: Secondary | ICD-10-CM

## 2019-08-28 DIAGNOSIS — J910 Malignant pleural effusion: Secondary | ICD-10-CM

## 2019-08-28 LAB — GLUCOSE, CAPILLARY
Glucose-Capillary: 102 mg/dL — ABNORMAL HIGH (ref 70–99)
Glucose-Capillary: 105 mg/dL — ABNORMAL HIGH (ref 70–99)
Glucose-Capillary: 107 mg/dL — ABNORMAL HIGH (ref 70–99)
Glucose-Capillary: 110 mg/dL — ABNORMAL HIGH (ref 70–99)
Glucose-Capillary: 110 mg/dL — ABNORMAL HIGH (ref 70–99)
Glucose-Capillary: 91 mg/dL (ref 70–99)

## 2019-08-28 LAB — CBC
HCT: 45.4 % (ref 36.0–46.0)
Hemoglobin: 14.6 g/dL (ref 12.0–15.0)
MCH: 30 pg (ref 26.0–34.0)
MCHC: 32.2 g/dL (ref 30.0–36.0)
MCV: 93.4 fL (ref 80.0–100.0)
Platelets: 146 10*3/uL — ABNORMAL LOW (ref 150–400)
RBC: 4.86 MIL/uL (ref 3.87–5.11)
RDW: 13.7 % (ref 11.5–15.5)
WBC: 29.3 10*3/uL — ABNORMAL HIGH (ref 4.0–10.5)
nRBC: 0.2 % (ref 0.0–0.2)

## 2019-08-28 MED ORDER — BISACODYL 10 MG RE SUPP: 10.0000 mg | Freq: Every day | RECTAL | Status: DC | PRN

## 2019-08-28 MED ORDER — PREDNISONE 5 MG PO TABS
30.0000 mg | ORAL_TABLET | Freq: Every day | ORAL | Status: DC
  Administered 2019-08-29: 30 mg via ORAL
  Filled 2019-08-28: qty 1

## 2019-08-28 MED ORDER — ONDANSETRON HCL 4 MG/2ML IJ SOLN
4.0000 mg | Freq: Four times a day (QID) | INTRAMUSCULAR | Status: DC | PRN
  Administered 2019-08-28: 4 mg via INTRAVENOUS
  Filled 2019-08-28: qty 2

## 2019-08-28 NOTE — Progress Notes (Signed)
EKG obtained to evaluate tachycardia per MD orders - SKG shows ST rate 133.  Patient with no complaints at this time.  BP stable

## 2019-08-28 NOTE — Progress Notes (Signed)
[facility]                                                                              Patient Demographics  Jordan Clay, is a 68 y.o. female, DOB - [birth year], TDV:[id number]  Admit date - 08/07/2019   Admitting Physician Vianne Bulls, MD  Outpatient Primary MD for the patient is Hulan Fess, MD  Outpatient specialists:   LOS - 21  days   Medical records reviewed and are as summarized below:    Chief Complaint  Patient presents with   COPD   low sats       Brief summary   Patient is a 68 year old female with 40 pack year smoking history,COPD who was recently treated for pneumonia by her PCP a week prior to admission,hyperlipidemia presented to [facility] on 10/19 with reports of 2 weeks of cough &SOB. CTA chest revealed obstruction of the central RLL bronchus with associated postobstructive PNA &pleural effusion. She underwent thoracentesis on 10/20 by IR with pleural fluid consistent with metastatic adenocarcinoma. PCCM was consulted and patient underwent EBUS on 10/21 with pathology consistent with non-small cell carcinoma. Oncology, Dr Alen Blew, was consulted - not felt to be a candidate for systemic chemotherapy currently.  subsequently rad-onc involved with plan for 2 week XRT starting 08/14/19. Patient however was transferred to SDU for hypoxic resp failure on 10/24 requiring right sided chest tube placement--subsequently converted to pleurx catheter. Patient was requiring 20 lits High flow 02 until last week but improving now and down to 3lits [place] on 11/3. Patient received abx: Rocephin/Azithro 10/19 >> 10/24 and Unasyn 10/26 >> 10/31. COVID -ve, blood cx and pleural fluid/BAL cx -ve so far. Patient completed 7/14 radiation treatments. Transfer to hospitalist service on 11/5.  Assessment & Plan    Principal Problem: Acute respiratory failure with hypoxia secondary to lung CA/obstructed airway/postobstructive pneumonia -CTA chest revealed obstruction  of the central RLL bronchus with associated postobstructive PNA and pleural effusion.  COVID-19 test negative -Underwent thoracentesis on 10/20 with pleural fluid consistent with metastatic adeno CA -PCCM was consulted, underwent EBUS on 10/21, pathology consistent with non-small cell carcinoma -Status post right Pleurx catheter placement -Received Rocephin/Azithro 10/19 >> 10/24 and Unasyn 10/26 > 10/31 -Pulmonary following, Pleurx drainage every other day, MWF, Sunday -Currently O2 sats 91% on 5 L high flow, wean as tolerated. -Needs SNF for rehab and and then consideration for chemotherapy.  Seen by palliative medicine on 11/8, patient requesting the input from oncology guarding possible care pathways and prognosis.  She was seen by Dr. Alen Blew on 11/4, had recommended basic improvements that need to occur before even considering intravenous systemic therapy to treat her cancer, improvement in the nutritional status, mobility and stabilization of the respiratory status. 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  Currently significant coughing, shortness of breath with minimal exertion, poor functional status.  Requested palliative care consult for goals of care and symptom management -Cough is improving, continue Tessalon Perles, Tussionex scheduled, duo nebs, Solu-Medrol.   -  Pulmonology following  Acute on chronic hypotension -Patient had systolic BP drop to around 70s on 11/5 has baseline low BP. -Avoid too frequent or large volume Pleurx catheter drainage -Hypotension limiting physical therapy and Pleurx drainage, started low-dose midodrine 5 mg twice a day however BP still remains soft -will place TED hoses  Hyperlipidemia LDL 77, cholesterol 149, triglycerides 153  Urinary retention Patient was noted to have  urinary retention on 11/5, continue Foley  Leukocytosis In the setting of steroids, not spiking any fevers, improving  Malnutrition of moderate degree. -BMI 25.3. -SLP evaluation done, reviewed recommendations.  Continue full liquids, will add nutritional supplements  Code Status: DNR DVT Prophylaxis:  Lovenox  Family Communication: Discussed all imaging results, lab results, explained to the patient    Disposition Plan: Needs out of bed to chair, improve mobility, PT OT evaluation.  Patient wants to go home however assess with physical therapy evaluation for safe discharge planning.  Time Spent in minutes 25 minutes Procedures:    Consultants:   Pulmonology Oncology Palliative medicine  Antimicrobials:   Anti-infectives (From admission, onward)   Start     Dose/Rate Route Frequency Ordered Stop   08/14/19 1100  Ampicillin-Sulbactam (UNASYN) 3 g in sodium chloride 0.9 % 100 mL IVPB     3 g 200 mL/hr over 30 Minutes Intravenous Every 6 hours 08/14/19 1026 08/19/19 0556   08/10/19 1200  cefTRIAXone (ROCEPHIN) 2 g in sodium chloride 0.9 % 100 mL IVPB     2 g 200 mL/hr over 30 Minutes Intravenous Every 24 hours 08/10/19 1043 08/13/19 1355   08/08/19 1600  cefTRIAXone (ROCEPHIN) 2 g in sodium chloride 0.9 % 100 mL IVPB  Status:  Discontinued     2 g 200 mL/hr over 30 Minutes Intravenous Every 24 hours 08/07/19 2043 08/10/19 0723   08/08/19 1600  azithromycin (ZITHROMAX) 500 mg in sodium chloride 0.9 % 250 mL IVPB     500 mg 250 mL/hr over 60 Minutes Intravenous Every 24 hours 08/07/19 2043 08/12/19 1844   08/07/19 1600  cefTRIAXone (ROCEPHIN) 1 g in sodium chloride 0.9 % 100 mL IVPB     1 g 200 mL/hr over 30 Minutes Intravenous  Once 08/07/19 1556 08/07/19 1910   08/07/19 1600  azithromycin (ZITHROMAX) 500 mg in sodium chloride 0.9 % 250 mL IVPB     500 mg 250 mL/hr over 60 Minutes Intravenous  Once 08/07/19 1556 08/07/19 2012         Medications  Scheduled Meds:   benzonatate  200 mg Oral TID   Chlorhexidine Gluconate Cloth  6 each Topical Daily   chlorpheniramine-HYDROcodone  5 mL Oral Q6H   enoxaparin (LOVENOX) injection  40 mg Subcutaneous Q24H   feeding supplement  1 Container Oral TID WC   feeding supplement (ENSURE ENLIVE)  237 mL Oral BID BM   gabapentin  100 mg Oral TID   hydrocortisone cream   Topical BID   insulin aspart  0-9 Units Subcutaneous Q4H   ipratropium-albuterol  3 mL Nebulization TID   mouth rinse  15 mL Mouth Rinse BID   methylPREDNISolone (SOLU-MEDROL) injection  40 mg Intravenous Q24H   midodrine  5 mg Oral BID WC   pantoprazole  40 mg Oral BID AC   polyethylene glycol  17 g Oral BID   ramelteon  8 mg Oral QHS   sodium chloride flush  3 mL Intravenous Q12H   Continuous Infusions: PRN Meds:.acetaminophen **OR** acetaminophen, albuterol, bisacodyl, hydrOXYzine, menthol-cetylpyridinium, menthol-cetylpyridinium, morphine  injection, ondansetron (ZOFRAN) IV, oxyCODONE, phenol, senna-docusate, temazepam      Subjective:   Jordan Clay was seen and examined today.  Feels miserable, nauseous, dry heaving.  Cough is better.  On 5 L O2.  Shortness of breath with minimal exertion.  No fevers.  No abdominal pain or diarrhea.  Objective:   Vitals:   08/27/19 1928 08/27/19 2012 08/28/19 0432 08/28/19 0736  BP:  95/63 94/65   Pulse:  (!) 110 (!) 118   Resp:  18 20   Temp:  98.1 F (36.7 C) 98.6 F (37 C)   TempSrc:  Oral Oral   SpO2: 95% 93% 92% 91%  Weight:      Height:        Intake/Output Summary (Last 24 hours) at 08/28/2019 1148 Last data filed at 08/28/2019 1100 Gross per 24 hour  Intake 120 ml  Output 700 ml  Net -580 ml     Wt Readings from Last 3 Encounters:  08/21/19 73.4 kg  09/27/18 67.5 kg     Physical Exam  General: Alert and oriented x 3, ill-appearing, short of breath on minimal movement  Eyes:   HEENT:  Atraumatic, normocephalic  Cardiovascular: S1 S2 clear, no murmurs,  RRR. No pedal edema b/l  Respiratory: Shallow breathing, decreased breath sounds at the bases, Pleurx catheter  Gastrointestinal: Soft, nontender, nondistended, NBS  Ext: no pedal edema bilaterally  Neuro: no new deficits  Musculoskeletal: No cyanosis, clubbing  Skin: Macular rash along the right scapular area to right axilla  Psych: Ill-appearing, somewhat irritable today   Data Reviewed:  I have personally reviewed following labs and imaging studies  Micro Results No results found for this or any previous visit (from the past 240 hour(s)).  Radiology Reports Dg Chest 1 View  Result Date: 08/21/2019 CLINICAL DATA:  Malignant pleural effusion EXAM: CHEST  1 VIEW COMPARISON:  Three days ago FINDINGS: Tunneled pleural catheter has than advanced with a more vertical appearance. Right pleural effusion has decreased mildly, with presumed ex vacuo pneumothorax at the apex where there was pleural fluid. Pulmonary opacity on both sides. Mediastinal enlargement from bulky adenopathy. IMPRESSION: Advanced right pleural catheter. There is decreased right pleural fluid but new, likely ex vacuo pneumothorax at the apex. Stable bilateral airspace disease. Electronically Signed   By: Monte Fantasia M.D.   On: 08/21/2019 05:06   Dg Chest 1 View  Result Date: 08/08/2019 CLINICAL DATA:  Post right thoracentesis EXAM: CHEST  1 VIEW COMPARISON:  08/07/2019 FINDINGS: Heart is normal size. Consolidation in the right lower lung with right pleural effusion. No pneumothorax following thoracentesis. Left lung clear. No acute bony abnormality. Heart is normal size. IMPRESSION: Continued right lower lung airspace opacity with small to moderate right effusion. No pneumothorax following thoracentesis. Electronically Signed   By: Rolm Baptise M.D.   On: 08/08/2019 10:26   Ct Chest Wo Contrast  Result Date: 08/19/2019 CLINICAL DATA:  Non-small cell lung cancer, evaluate PleurX catheter placement and response to  radiation EXAM: CT CHEST WITHOUT CONTRAST TECHNIQUE: Multidetector CT imaging of the chest was performed following the standard protocol without IV contrast. COMPARISON:  CT chest angiogram, 08/07/2019 FINDINGS: Cardiovascular: Aortic atherosclerosis. Normal heart size. Trace pericardial effusion. Mediastinum/Nodes: Very bulky superior mediastinal, mediastinal, and right hilar lymphadenopathy is increased in bulk compared to prior examination, the largest pretracheal lymph node that can be discretely measured measures 3.3 x 3.0 cm, previously 3.1 x 2.5 cm when measured similarly (series 2, image 55). There  has particularly been an increase in bulk of matted superior mediastinal lymph nodes (series 2, image 23). Thyroid gland, trachea, and esophagus demonstrate no significant findings. Lungs/Pleura: There has been interval placement of a right-sided tunneled pleural drainage catheter, which is looped around the anterior pleural space, traversing the minor fissure and epicardial fat lobulations, tip positioned about the superolateral right pleural space. There is a moderate volume right hydropneumothorax with a small right pneumothorax component and moderate pleural effusion. There is a small left pleural effusion. Diffuse bilateral interlobular septal thickening, ground-glass opacity and some evidence of pulmonary nodularity, for example in the right pulmonary apex new nodules measuring up to 1.0 cm (series 5, image 26). This mild underlying emphysema. There has been increase in dense, masslike postobstructive consolidation of the right lung, with total consolidation of the right middle and right lower lobes. Perihilar mass cannot be discerned from adjacent consolidated lung on noncontrast examination. Upper Abdomen: No acute abnormality. Enlarged gastrohepatic lymph nodes in the upper abdomen, increased in size compared to prior examination, measuring up to 2.0 x 1.6 cm, previously 1.7 x 1.1 cm (series 2, image  140). Musculoskeletal: No chest wall mass or suspicious bone lesions identified. IMPRESSION: 1. There has been interval placement of a right-sided tunneled pleural drainage catheter, which is looped around the anterior pleural space, traversing the minor fissure and epicardial fat lobulations, tip positioned about the superolateral right pleural space. There is a moderate volume right hydropneumothorax with a small right pneumothorax component and moderate pleural effusion. There is a small left pleural effusion. 2. There has been increase in dense, masslike postobstructive consolidation of the right lung, with total consolidation of the right middle and right lower lobes. Perihilar mass cannot be discerned from adjacent consolidated lung on noncontrast examination. 3. Diffuse bilateral interlobular septal thickening, ground-glass opacity and some evidence of pulmonary nodularity, for example in the right pulmonary apex new nodules measuring up to 1.0 cm (series 5, image 26). These findings may reflect a component of edema but are very concerning for lymphangitic spread of metastatic disease. 4. Very bulky superior mediastinal, mediastinal, and right hilar lymphadenopathy is increased in bulk compared to prior examination, the largest pretracheal lymph node that can be discretely measured measures 3.3 x 3.0 cm, previously 3.1 x 2.5 cm when measured similarly (series 2, image 55). There has particularly been an increase in bulk of matted superior mediastinal lymph nodes (series 2, image 23). 5. Enlarged gastrohepatic lymph nodes in the upper abdomen, increased in size compared to prior examination, measuring up to 2.0 x 1.6 cm, previously 1.7 x 1.1 cm (series 2, image 140). 6.  Emphysema (ICD10-J43.9).  Aortic Atherosclerosis (ICD10-I70.0). Electronically Signed   By: Eddie Candle M.D.   On: 08/19/2019 13:48   Ct Angio Chest Pe W/cm &/or Wo Cm  Result Date: 08/07/2019 CLINICAL DATA:  Shortness of breath and  hypoxia. Recent pneumonia. EXAM: CT ANGIOGRAPHY CHEST WITH CONTRAST TECHNIQUE: Multidetector CT imaging of the chest was performed using the standard protocol during bolus administration of intravenous contrast. Multiplanar CT image reconstructions and MIPs were obtained to evaluate the vascular anatomy. CONTRAST:  156m OMNIPAQUE IOHEXOL 350 MG/ML SOLN COMPARISON:  06/10/2018 FINDINGS: Cardiovascular: Satisfactory opacification of pulmonary arteries noted, and no pulmonary emboli identified. No evidence of thoracic aortic dissection or aneurysm. Aortic atherosclerosis. Mediastinum/Nodes: New bulky lymphadenopathy is seen throughout the right paratracheal and subcarinal regions, with subcarinal soft tissue density measuring up to 4.2 cm short axis. Right hilar lymphadenopathy is also seen measuring  at least 2.2 cm in short axis. Obstruction of the central right lower lobe bronchus and central low-attenuation is suspicious for centrally obstructing mass. Lungs/Pleura: There is near complete right lower lobe consolidation, suspicious for postobstructive pneumonitis. A moderate right pleural effusion is also seen. Several bilateral upper lobe and right middle lobe pulmonary nodules are seen measuring up to 5 mm which are stable. Mild emphysema again noted.33 Upper abdomen: No acute findings. Musculoskeletal: No suspicious bone lesions identified. Review of the MIP images confirms the above findings. IMPRESSION: No evidence of pulmonary embolism. Obstruction of central right lower lobe bronchus and central low-attenuation, suspicious for centrally obstructing mass. Consider further evaluation with bronchoscopy. Near complete right lower lobe consolidation, suspicious for postobstructive pneumonitis. Moderate right pleural effusion. Consider diagnostic thoracentesis. New bulky mediastinal and right hilar lymphadenopathy, consistent with metastatic disease. Stable sub-cm bilateral upper lobe and right middle lobe  pulmonary nodules. Aortic Atherosclerosis (ICD10-I70.0) and Emphysema (ICD10-J43.9). Electronically Signed   By: Marlaine Hind M.D.   On: 08/07/2019 19:23   Ct Abdomen Pelvis W Contrast  Result Date: 08/09/2019 CLINICAL DATA:  Newly diagnosed right lung carcinoma. Staging. EXAM: CT ABDOMEN AND PELVIS WITH CONTRAST TECHNIQUE: Multidetector CT imaging of the abdomen and pelvis was performed using the standard protocol following bolus administration of intravenous contrast. CONTRAST:  146m OMNIPAQUE IOHEXOL 300 MG/ML SOLN, 342mOMNIPAQUE IOHEXOL 300 MG/ML SOLN COMPARISON:  Chest only CTA on 08/07/2019; no prior AP CT FINDINGS: Lower Chest: Mediastinal and right hilar lymphadenopathy, bilateral pleural effusions, and right basilar consolidation, as better demonstrated on recent chest CT. Hepatobiliary: No hepatic masses identified. Pancreas:  No mass or inflammatory changes. Spleen: Within normal limits in size and appearance. Adrenals/Urinary Tract: No masses identified. No evidence of hydronephrosis. Stomach/Bowel: No evidence of obstruction, inflammatory process or abnormal fluid collections. Normal appendix visualized. Vascular/Lymphatic: Mild upper lymphadenopathy is seen in the gastrohepatic ligament, with largest lymph node measuring 1.3 cm short axis. A 10 mm retroperitoneal lymph node is seen in the left paraaortic region. No lymphadenopathy identified within the pelvis. No abdominal aortic aneurysm. Aortic atherosclerosis. Reproductive:  No mass or other significant abnormality. Other:  None. Musculoskeletal:  No suspicious bone lesions identified. IMPRESSION: Mild upper abdominal lymphadenopathy in gastrohepatic ligament and left paraaortic region, consistent with metastatic disease. No other sites of metastatic disease identified within the abdomen or pelvis. Electronically Signed   By: JoMarlaine Hind.D.   On: 08/09/2019 18:28   Dg Chest Port 1 View  Result Date: 08/25/2019 CLINICAL DATA:  Increased  shortness of breath. EXAM: PORTABLE CHEST 1 VIEW COMPARISON:  Chest x-ray 08/21/2019. FINDINGS: Right pleural catheter again noted with tip over the lateral aspect of the right upper chest. Unchanged large right pleural effusion. Unchanged right lung infiltrate. Persistent left base atelectasis. No pneumothorax. Stable cardiomegaly. No acute bony abnormality. IMPRESSION: 1. Right pleural catheter again noted with tip over the lateral aspect of the right upper chest. Unchanged large right pleural effusion. No pneumothorax. 2. Unchanged right lung infiltrate. Persistent left base atelectasis. 3.  Stable cardiomegaly. Electronically Signed   By: ThMarcello MooresRegister   On: 08/25/2019 07:37   Dg Chest Port 1 View  Result Date: 08/19/2019 CLINICAL DATA:  Postobstructive pneumonia. Ex-smoker. Follow-up right pneumothorax. EXAM: PORTABLE CHEST 1 VIEW COMPARISON:  08/16/2019 FINDINGS: The previously demonstrated small right pneumothorax is no longer demonstrated with a chest tube remaining in place. There is increased pleural fluid on the right with a moderate to large sized pleural effusion currently demonstrated. There  is less patchy opacity in the right lung. The interstitial markings in the left lung remain prominent. The cardiac silhouette remains borderline enlarged. Unremarkable bones. IMPRESSION: 1. Resolved small right pneumothorax. 2. Increased right pleural fluid with a moderate to large sized pleural effusion currently demonstrated. 3. Decreased patchy atelectasis or pneumonia in the right lung. 4. Stable changes of COPD with chronic interstitial lung disease and possible interstitial pneumonitis. Electronically Signed   By: Claudie Revering M.D.   On: 08/19/2019 06:06   Dg Chest Port 1 View  Result Date: 08/16/2019 CLINICAL DATA:  68 year old female with chest tube placement EXAM: PORTABLE CHEST 1 VIEW COMPARISON:  Multiple prior chest x-ray most recent 08/15/2019 FINDINGS: Cardiomediastinal silhouette  unchanged in size and contour, with the heart borders obscured by overlying lung/pleural disease. Similar appearance of opacity in the right suprahilar region along the mediastinal border. Dense opacity at the right lower lung, with a gradient of reticulonodular opacities decreasing towards the upper lung. Interval development of small right pneumothorax and right chest wall myofacial/subcutaneous gas. Unchanged right thoracostomy tube. Reticular opacities of the left lung, similar to the prior. IMPRESSION: Unchanged right thoracostomy tube with interval development of small right pneumothorax, as well as subcutaneous/myofacial gas of the right chest wall. Unchanged right basilar opacity likely a combination of consolidation/atelectasis, tumor, and/or residual pleural fluid. Reticular opacities bilaterally suggesting increasing edema. Electronically Signed   By: Corrie Mckusick D.O.   On: 08/16/2019 07:55   Dg Chest Port 1 View  Result Date: 08/15/2019 CLINICAL DATA:  Respiratory failure. EXAM: PORTABLE CHEST 1 VIEW COMPARISON:  August 14, 2019. FINDINGS: Stable cardiomediastinal silhouette. Right-sided chest tube is noted without pneumothorax. Stable right lung opacity is noted with associated pleural effusion. Stable left lung opacities are noted as well. Atherosclerosis of thoracic aorta is noted. No pneumothorax. Bony thorax is unremarkable. IMPRESSION: Stable right lung opacity with associated pleural effusion. Stable right-sided chest tube without pneumothorax. Stable left lung opacities are noted as well. Aortic Atherosclerosis (ICD10-I70.0). Electronically Signed   By: Marijo Conception M.D.   On: 08/15/2019 07:42   Dg Chest Port 1 View  Result Date: 08/14/2019 CLINICAL DATA:  Pleural effusion on the right EXAM: PORTABLE CHEST 1 VIEW COMPARISON:  Yesterday FINDINGS: Right-sided chest tube in place. Extensive right lower lobe opacification by CT with underlying malignancy. Small or moderate pleural  effusion is unchanged. Haziness of the left chest from atelectasis and pleural fluid. No pneumothorax. Stable heart size. IMPRESSION: 1. Unchanged right lower lobe opacification with overlying pleural fluid. 2. Mild atelectasis and pleural fluid at the left base. Electronically Signed   By: Monte Fantasia M.D.   On: 08/14/2019 06:41   Dg Chest Port 1 View  Result Date: 08/13/2019 CLINICAL DATA:  Right-sided chest tube placement for effusion EXAM: PORTABLE CHEST 1 VIEW COMPARISON:  Radiograph 08/12/2019 FINDINGS: Neural placement of a right chest tube with the tip positioned in the mid lung. Interval decrease in the size of the right pleural effusion with residual hazy opacity in the right lung base likely reflecting combination of atelectasis and possible re-expansion edema. Persistent hazy opacities are seen in the left lung base as well. The left costophrenic sulcus is collimated from view. No visible left effusion. No pneumothorax. Right hilar mass distorts the cardiomediastinal contour. Remaining contours are unchanged from prior. IMPRESSION: 1. Interval decrease in the size of the right pleural effusion status post right chest tube placement. 2. Persistent hazy opacities in the right lung base, likely reflecting combination  of atelectasis and possible reexpansion edema. 3. Grossly unchanged right hilar mass. Electronically Signed   By: Lovena Le M.D.   On: 08/13/2019 16:30   Dg Chest Port 1 View  Result Date: 08/12/2019 CLINICAL DATA:  Coughing, shortness of breath EXAM: PORTABLE CHEST 1 VIEW COMPARISON:  Chest radiographs, 08/08/2019, CT chest, 08/07/2019 FINDINGS: Interval increase in a right pleural effusion, now large, with associated atelectasis or consolidation. The heart and mediastinum are predominantly obscured. The left lung is normally aerated. IMPRESSION: Interval increase in a right pleural effusion, now large, with associated atelectasis or consolidation. Findings are consistent with  a malignant effusion associated with a right hilar mass better appreciated by prior CT. Electronically Signed   By: Eddie Candle M.D.   On: 08/12/2019 19:20   Dg Chest Port 1 View  Result Date: 08/08/2019 CLINICAL DATA:  Post thoracentesis ongoing shortness of breath and cough. EXAM: PORTABLE CHEST 1 VIEW COMPARISON:  08/08/2019 and 08/07/2019 FINDINGS: Right hilar and mediastinal mass with right lower lobe consolidation and mass similar to prior study. Likely with persistent pleural effusion. No visible pneumothorax. No significant mediastinal shift. No signs of acute bone finding. Left chest is clear. IMPRESSION: 1. No interval change in the appearance of the chest. No pneumothorax. 2. Right hilar and mediastinal mass and right lower lobe consolidation are similar to prior study. Postobstructive pneumonia is considered. Electronically Signed   By: Zetta Bills M.D.   On: 08/08/2019 14:19   Dg Chest Port 1 View  Result Date: 08/07/2019 CLINICAL DATA:  Worsening short of breath and hypoxia.  COPD. EXAM: PORTABLE CHEST 1 VIEW COMPARISON:  08/04/2019 FINDINGS: Progression of right lower lobe infiltrate compared to the prior study. Possible right pleural effusion has developed. Left lung remains clear. Negative for heart failure or edema. Atherosclerotic aortic arch. IMPRESSION: Progression of right lower lobe infiltrate and probable right pleural effusion. Probable pneumonia. Given the progression, CT chest with contrast may be helpful for further evaluation. Electronically Signed   By: Franchot Gallo M.D.   On: 08/07/2019 15:44   US Thoracentesis Asp Pleural Space W/img Guide  Result Date: 08/08/2019 INDICATION: Shortness of breath. Newly found right sided lung mass with pleural effusion. Request for diagnostic and therapeutic thoracentesis. EXAM: ULTRASOUND GUIDED RIGHT THORACENTESIS MEDICATIONS: None. COMPLICATIONS: None immediate. PROCEDURE: An ultrasound guided thoracentesis was thoroughly  discussed with the patient and questions answered. The benefits, risks, alternatives and complications were also discussed. The patient understands and wishes to proceed with the procedure. Written consent was obtained. Ultrasound was performed to localize and mark an adequate pocket of fluid in the right chest. The area was then prepped and draped in the normal sterile fashion. 1% Lidocaine was used for local anesthesia. Under ultrasound guidance a 6 Fr Safe-T-Centesis catheter was introduced. Thoracentesis was performed. The catheter was removed and a dressing applied. FINDINGS: A total of approximately 700 mL of hazy, pale amber colored fluid was removed. Samples were sent to the laboratory as requested by the clinical team. IMPRESSION: Successful ultrasound guided right thoracentesis yielding 700 mL of pleural fluid. Read by: Ascencion Dike PA-C Electronically Signed   By: Sandi Mariscal M.D.   On: 08/08/2019 13:44    Lab Data:  CBC: Recent Labs  Lab 08/24/19 1744 08/25/19 0407 08/26/19 0225 08/27/19 0236 08/28/19 0428  WBC 30.2* 31.6* 36.6* 36.5* 29.3*  HGB 13.4 13.9 13.5 12.7 14.6  HCT 42.4 44.7 42.6 41.4 45.4  MCV 93.8 94.5 94.7 94.3 93.4  PLT 281 261  228 197 175*   Basic Metabolic Panel: Recent Labs  Lab 08/24/19 1744 08/25/19 0407 08/26/19 0225 08/27/19 0236  NA 135 138 139 138  K 3.8 3.7 3.7 3.8  CL 99 100 101 98  CO2 _0 GLUCOSE 139* 120* 105* 106*  BUN _1 CREATININE 0.75 0.68 0.53 0.55  CALCIUM 8.2* 8.1* 8.4* 8.3*   GFR: Estimated Creatinine Clearance: 66.4 mL/min (by C-G formula based on SCr of 0.55 mg/dL). Liver Function Tests: No results for input(s): AST, ALT, ALKPHOS, BILITOT, PROT, ALBUMIN in the last 168 hours. No results for input(s): LIPASE, AMYLASE in the last 168 hours. No results for input(s): AMMONIA in the last 168 hours. Coagulation Profile: No results for input(s): INR, PROTIME in the last 168 hours. Cardiac Enzymes: No results  for input(s): CKTOTAL, CKMB, CKMBINDEX, TROPONINI in the last 168 hours. BNP (last 3 results) No results for input(s): PROBNP in the last 8760 hours. HbA1C: No results for input(s): HGBA1C in the last 72 hours. CBG: Recent Labs  Lab 08/27/19 1648 08/27/19 2008 08/28/19 0020 08/28/19 0427 08/28/19 0745  GLUCAP 96 104* 91 102* 107*   Lipid Profile: Recent Labs    08/26/19 0225  CHOL 149  HDL 41  LDLCALC 77  TRIG 153*  CHOLHDL 3.6   Thyroid Function Tests: No results for input(s): TSH, T4TOTAL, FREET4, T3FREE, THYROIDAB in the last 72 hours. Anemia Panel: No results for input(s): VITAMINB12, FOLATE, FERRITIN, TIBC, IRON, RETICCTPCT in the last 72 hours. Urine analysis:    Component Value Date/Time   COLORURINE YELLOW 08/07/2019 1557   APPEARANCEUR HAZY (A) 08/07/2019 1557   LABSPEC 1.016 08/07/2019 1557   PHURINE 6.0 08/07/2019 1557   GLUCOSEU NEGATIVE 08/07/2019 1557   HGBUR SMALL (A) 08/07/2019 1557   BILIRUBINUR NEGATIVE 08/07/2019 1557   KETONESUR 5 (A) 08/07/2019 1557   PROTEINUR NEGATIVE 08/07/2019 1557   NITRITE NEGATIVE 08/07/2019 1557   LEUKOCYTESUR NEGATIVE 08/07/2019 1557     Crissa Sowder M.D. [facility] 08/28/2019, 11:48 AM  Pager: [phone] Between 7am to 7pm - call Pager - [phone]  After 7pm go to [email or web address] - password [email or web address]  Call night coverage person covering after 7pm

## 2019-08-28 NOTE — Progress Notes (Signed)
NAME:  Jordan Clay, MRN:  [id number], DOB:  [birth year], LOS: 24 ADMISSION DATE:  08/07/2019, CONSULTATION DATE:  10/20 REFERRING MD:  Reesa Chew (THR) , CHIEF COMPLAINT:  Cough, SOB  Brief History   68 yo female, smoker (40 pack years, quit 1 week PTA when she got pneumonia), with COPD, who presented to [facility] on 10/19 with reports of 2 weeks of cough & SOB.  She was treated for PNA by her PCP.  Work up included a CTA chest which showed obstruction of the central RLL bronchus with associated postobstructive PNA & pleural effusion.  She underwent thoracentesis on 10/20 with pleural fluid consistent with metastatic adenocarcinoma. FOB with EBUS on 10/21 with pathology consistent with non-small cell carcinoma.   Past Medical History   has a past medical history of COPD (chronic obstructive pulmonary disease) ([place]), High cholesterol, and Pneumonia.   Significant Hospital Events   10/19 Admit with post-obstructive PNA 10/20 PCCM consulted 10/20 IR thora  - metastatic adenoca 10/21 FOB with EBUS - NSCLC + 10/21 - Onc consult - Dr Alen Blew -  10/23 - Rad onc consult - ECOG 2. 2 week XRT recommendined startbng 08/14/2019 10/24 Tx to SDU for hypoxic resp failure, cough, SOB 10/25  R chest tube IN 10/27 - palliative consult 10/29 20L flow / 50% FiO2, 1100 ml out of chest tube 1030 20L flow / 45% FiO2  10/31 - R chest tube OUT ->  PleurX  11/2 - Oncology plan ->   If she has actionable mutation that can be used to to target her cancer, disease might be able to be palliated with systemic oral therapy.  Overall she is not candidate for systemic chemotherapy at this time. 11/3 oxygen requirement continues to improve, 3 L today - Still having issues with fatigue Cough and is better Oxygen requirement improving 11/6: Increased pleural effusion.  Drained 500 mL, similar to last volume removed 11/7: Right chest tube drained 400 ml   Consults:  PCCM 10/20    Procedures:  Right Chest Tube 10/25 >> 10/31  PleurX 10/31 11/2 Pleurx drain for 900 cc of fluid Significant Diagnostic Tests:  CTA chest 10/19 >> No evidence of pulmonary embolism.  Obstruction of central right lower lobe bronchus and central low-attenuation, suspicious for centrally obstructing mass.  Near complete right lower lobe consolidation, suspicious for postobstructive pneumonitis. Moderate right pleural effusion. New bulky mediastinal and right hilar lymphadenopathy, consistent with metastatic disease.  Stable sub-cm bilateral upper lobe and right middle lobe pulmonary nodules. IR Thoracentesis 10/20 >> 700 cc  No improvement is sob or cough/ cyt pos adenocarcinoma of the lung Bronchoscopy 10/21 >> 90% obst distal BI/ with endobronchial ultrasound   Cytology R Pleural Fluid 10/20 >> metastatic adenocarcinoma  Cytology 7, FNA 10/21 >> non-small cell carcinoma  Cytology 11 R FNA 10/21 >> non-small cell carcinoma  Cytology RLL brushing 10/21 >> non-small cell, TTF-1 positive   Micro Data:  BC x 2 10/19 >> neg  COVID 10/19 >> neg  Pleural fluid  10/20 >> neg  BAL 10/21 >> neg  Antimicrobials:  Rocephin 10/19 >> 10/24 Azithro 10/19 >> 10/24 Unasyn 10/26 >> 10/31  Interim history/subjective:  Very short of breath Objective   Blood pressure 94/65, pulse (Abnormal) 118, temperature 98.6 F (37 C), temperature source Oral, resp. rate 20, height _0  (1.702 m), weight 73.4 kg, SpO2 91 %.      FiO2 (%):  [40 %] 40 %   Intake/Output Summary (Last 24 hours)  at 08/28/2019 1220 Last data filed at 08/28/2019 1100 Gross per 24 hour  Intake 120 ml  Output 700 ml  Net -580 ml   Filed Weights   08/07/19 1504 08/21/19 2300  Weight: 70.3 kg 73.4 kg    General this is a anxious 68 year old female she appears much older than stated age she is currently in bed with frequent coughing significant short of breath with any activity whatsoever HEENT some temporal wasting mucous membranes moist, no JVD Pulmonary diminished throughout  right greater than left Cardiac tachycardic regular rhythm Abdomen soft not tender Extremities warm Neuro intact  Resolved Hospital Problem list     Assessment & Plan:   Acute hypoxemic respiratory failure 2/2 -Metastatic adenocarcinoma of the lung  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  Trapped lung Likely underlying obstructive lung disease Cough Insomnia Deconditioning  Interval Completed final XRT on 11/6 Looks like she drained 400 on 11/7 and 550 on 11/6 from chest tube   Plan Cont supplemental oxygen  Cont cough suppression w/ tussionex and tessalon.  PRN analgesia Cont PPI Drain CT daily Taper steroids to off over next 7-10 d F/u w/ onc as out pt (will need to improve fxn status before would be candidate for chemo Dc pleurx sutures  Will arrange f/u w/ Dr Trilby Leaver ACNP-BC [place] Pager # [phone] OR # [phone] if no answer

## 2019-08-28 NOTE — Progress Notes (Signed)
375 cc of pink fluid drained from [place] - patient tolerated well

## 2019-08-28 NOTE — Progress Notes (Signed)
Occupational Therapy Treatment Patient Details Name: Jordan Clay MRN: [id number] DOB: 1951/10/04 Today's Date: 08/28/2019    History of present illness 68 yo female admitted with shortness of breath. Imaging (+) R lung mass. New diagnosis of stage IV met lung cancer. S/P thoracotomy. Pleurx catheter 10/31. Hx of COPD, smoker.   OT comments  Pt sat EOB for grooming task.  Pt needed encouragement but was agreeable. Pt feeling SOB limiting session. Pts HR up to 151 ( RN aware) and  sats 84-89 with activity  Follow Up Recommendations  Supervision/Assistance - 24 hour;SNF(depending on progress)    Equipment Recommendations  3 in 1 bedside commode       Precautions / Restrictions Precautions Precautions: Fall Precaution Comments: monitor HR, BP, O2 Restrictions Weight Bearing Restrictions: No       Mobility Bed Mobility Overal bed mobility: Needs Assistance Bed Mobility: Supine to Sit     Supine to sit: Min assist     General bed mobility comments: hand held assist to get up with [facility] raised  Transfers Overall transfer level: Needs assistance Equipment used: 2 person hand held assist Transfers: Sit to/from Stand Sit to Stand: Min assist;+2 safety/equipment;+2 physical assistance         General transfer comment: +2 min A for safety and HHA; cues to push up and for controlled descent.  Increased time for rest breaks and cued for breathing technique    Balance Overall balance assessment: Needs assistance Sitting-balance support: Bilateral upper extremity supported;Feet supported Sitting balance-Leahy Scale: Fair     Standing balance support: Bilateral upper extremity supported Standing balance-Leahy Scale: Poor                             ADL either performed or assessed with clinical judgement   ADL Overall ADL's : Needs assistance/impaired     Grooming: Sitting;Oral care;Set up                                 General ADL  Comments: pt did sit EOB with OT and brush teeth.  HR 135-151,. RN aware.  Pt needed VC to take deep breaths. Pt took 3 steps to head of bed and returned to supine     Vision Patient Visual Report: No change from baseline            Cognition Arousal/Alertness: Awake/alert Behavior During Therapy: Anxious Overall Cognitive Status: Within Functional Limits for tasks assessed                                                General Comments cued for posture and breathing technique    Pertinent Vitals/ Pain       Pain Assessment: No/denies pain         Frequency  Min 2X/week        Progress Toward Goals  OT Goals(current goals can now be found in the care plan section)  Progress towards OT goals: Progressing toward goals  Acute Rehab OT Goals Patient Stated Goal: breathe better; be able to sit and enjoy grandchildren  Plan Discharge plan remains appropriate    Co-evaluation      Reason for Co-Treatment: Complexity of the patient's impairments (multi-system involvement) PT goals addressed during session:  Mobility/safety with mobility;Balance;Strengthening/ROM OT goals addressed during session: ADL's and self-care      AM-PAC OT "6 Clicks" Daily Activity     Outcome Measure   Help from another person eating meals?: A Little Help from another person taking care of personal grooming?: A Little Help from another person toileting, which includes using toliet, bedpan, or urinal?: A Lot Help from another person bathing (including washing, rinsing, drying)?: A Lot Help from another person to put on and taking off regular upper body clothing?: A Lot Help from another person to put on and taking off regular lower body clothing?: A Lot 6 Click Score: 14    End of Session    OT Visit Diagnosis: Unsteadiness on feet (R26.81);Muscle weakness (generalized) (M62.81)   Activity Tolerance Patient limited by fatigue   Patient Left with call bell/phone  within reach;in bed;with family/visitor present;with bed alarm set   Nurse Communication Mobility status        Time: 1224-0018 OT Time Calculation (min): 16 min  Charges: OT General Charges $OT Visit: 1 Visit OT Treatments $Self Care/Home Management : 8-22 mins  Kari Baars, [place] [phone] Office- [phone]      Zayne Marovich, Edwena Felty D 08/28/2019, 2:35 PM

## 2019-08-28 NOTE — Progress Notes (Addendum)
SLP Cancellation Note  Patient Details Name: Jordan Clay MRN: [id number] DOB: 09-13-1951   Cancelled treatment:       Reason Eval/Treat Not Completed: Other (comment)(pt getting EKG with RN at this time, will continue efforts)   Macario Golds 08/28/2019, 2:15 PM  Luanna Salk, [place] [facility] SLP Acute Rehab Services Pager [phone] Office [phone]

## 2019-08-28 NOTE — Progress Notes (Signed)
Patients HR remains in 130s - MD aware,  Lopressor has been offered to patient twice this afternoon but she declines.  Patient denies feeling of heart racing or other symptoms.  Pt states "i'm fine, it will go down on its own".  Patient agreeable to taking meds this evening as well, after taking them though began wretching - spitting up small amount of mucous.  Pills not noted. States she was fine before taking pills and declines offer of zofran.

## 2019-08-28 NOTE — Care Management Important Message (Signed)
Important Message  Patient Details IM Letter given to Dessa Phi RN to present to the Patient Name: Jordan Clay MRN: [id number] Date of Birth: 1950/12/29   Medicare Important Message Given:  Yes     Kerin Salen 08/28/2019, 1:25 PM

## 2019-08-28 NOTE — Progress Notes (Signed)
Physical Therapy Treatment Patient Details Name: Jordan Clay MRN: [id number] DOB: 08-01-1951 Today's Date: 08/28/2019    History of Present Illness 68 yo female admitted with shortness of breath. Imaging (+) R lung mass. New diagnosis of stage IV met lung cancer. S/P thoracotomy. Pleurx catheter 10/31. Hx of COPD, smoker.    PT Comments    Pt required encouragement to participate.  She fatigues very easily.  HR 120 at rest and up to 140-150 bpm with activity.  O2 sats 90-91% rest and down to 85% with minimal activity.  Pt requires increase time for breaks for vital signs to recover.  She completes transfers with min A but min A x 2 for ambulation at EOB.  Pt unable to tolerate further ambulation due to vital signs and fatigue.  Progress as able.    Follow Up Recommendations  Home health PT;Supervision/Assistance - 24 hour     Equipment Recommendations  Rolling walker with 5" wheels;3in1 (PT);Wheelchair (measurements PT);Hospital bed    Recommendations for Other Services       Precautions / Restrictions Precautions Precautions: Fall Precaution Comments: monitor HR, BP, O2 Restrictions Weight Bearing Restrictions: No    Mobility  Bed Mobility Overal bed mobility: Needs Assistance Bed Mobility: Supine to Sit     Supine to sit: Min assist     General bed mobility comments: hand held assist to get up with [facility] raised  Transfers Overall transfer level: Needs assistance Equipment used: 2 person hand held assist Transfers: Sit to/from Stand Sit to Stand: Min assist;+2 safety/equipment;+2 physical assistance         General transfer comment: +2 min A for safety and HHA; cues to push up and for controlled descent.  Increased time for rest breaks and cued for breathing technique  Ambulation/Gait Ambulation/Gait assistance: Min assist;+2 safety/equipment Gait Distance (Feet): 3 Feet Assistive device: 2 person hand held assist Gait Pattern/deviations: Scissoring      General Gait Details: only side steps to [facility]; limited by fatigue, SHOB, and HR.  At rest HR 120 bpm and O2 sats 90-91%.  With activity sats down to 85% and HR up to 140's bpm.   Stairs             Wheelchair Mobility    Modified Rankin (Stroke Patients Only)       Balance Overall balance assessment: Needs assistance Sitting-balance support: Bilateral upper extremity supported;Feet supported Sitting balance-Leahy Scale: Fair     Standing balance support: Bilateral upper extremity supported Standing balance-Leahy Scale: Poor                              Cognition Arousal/Alertness: Awake/alert Behavior During Therapy: Anxious Overall Cognitive Status: Within Functional Limits for tasks assessed                                        Exercises      General Comments General comments (skin integrity, edema, etc.): cued for posture and breathing technique      Pertinent Vitals/Pain Pain Assessment: No/denies pain    Home Living                      Prior Function            PT Goals (current goals can now be found in the care plan section) Acute  Rehab PT Goals Patient Stated Goal: breathe better; be able to sit and enjoy grandchildren Progress towards PT goals: Progressing toward goals    Frequency    Min 3X/week      PT Plan Current plan remains appropriate    Co-evaluation PT/OT/SLP Co-Evaluation/Treatment: Yes Reason for Co-Treatment: Complexity of the patient's impairments (multi-system involvement) PT goals addressed during session: Mobility/safety with mobility;Balance;Strengthening/ROM OT goals addressed during session: ADL's and self-care      AM-PAC PT "6 Clicks" Mobility   Outcome Measure  Help needed turning from your back to your side while in a flat bed without using bedrails?: A Little Help needed moving from lying on your back to sitting on the side of a flat bed without using bedrails?: A  Little Help needed moving to and from a bed to a chair (including a wheelchair)?: A Lot Help needed standing up from a chair using your arms (e.g., wheelchair or bedside chair)?: A Lot Help needed to walk in hospital room?: A Lot Help needed climbing 3-5 steps with a railing? : Total 6 Click Score: 13    End of Session Equipment Utilized During Treatment: Oxygen Activity Tolerance: Patient limited by fatigue Patient left: with bed alarm set;with call bell/phone within reach;with family/visitor present Nurse Communication: Mobility status PT Visit Diagnosis: Pain;Muscle weakness (generalized) (M62.81);Difficulty in walking, not elsewhere classified (R26.2)     Time: 1117-3567 PT Time Calculation (min) (ACUTE ONLY): 16 min  Charges:  $Therapeutic Activity: 8-22 mins                     Maggie Font, PT Acute Rehab Services [phone]    Karlton Lemon 08/28/2019, 2:34 PM

## 2019-08-28 NOTE — Progress Notes (Signed)
Notified by RN that vitals result in MEWS score is in the red.  This has been the case all day.  She refused all medications except Tussionex.  The RN does not think a bedside evaluation is necessary at this time and will continue to monitor the patient.   Carlyon Shadow, M.D.

## 2019-08-28 NOTE — Progress Notes (Signed)
Patient is still refusing to take most of her medication.  Patient does not want an anti-emetic and refused medication to lower the Heart Rate.The MD has been notified that the MEWS is RED.

## 2019-08-29 ENCOUNTER — Inpatient Hospital Stay (HOSPITAL_COMMUNITY): Payer: Medicare Other

## 2019-08-29 DIAGNOSIS — Z515 Encounter for palliative care: Secondary | ICD-10-CM

## 2019-08-29 DIAGNOSIS — C3481 Malignant neoplasm of overlapping sites of right bronchus and lung: Secondary | ICD-10-CM

## 2019-08-29 DIAGNOSIS — R05 Cough: Secondary | ICD-10-CM

## 2019-08-29 DIAGNOSIS — R059 Cough, unspecified: Secondary | ICD-10-CM

## 2019-08-29 DIAGNOSIS — Z7189 Other specified counseling: Secondary | ICD-10-CM

## 2019-08-29 LAB — BASIC METABOLIC PANEL
Anion gap: 18 — ABNORMAL HIGH (ref 5–15)
BUN: 17 mg/dL (ref 8–23)
CO2: 22 mmol/L (ref 22–32)
Calcium: 9.1 mg/dL (ref 8.9–10.3)
Chloride: 98 mmol/L (ref 98–111)
Creatinine, Ser: 0.72 mg/dL (ref 0.44–1.00)
GFR calc Af Amer: >60 mL/min (ref >60)
GFR calc non Af Amer: >60 mL/min (ref >60)
Glucose, Bld: 116 mg/dL — ABNORMAL HIGH (ref 70–99)
Potassium: 4 mmol/L (ref 3.5–5.1)
Sodium: 138 mmol/L (ref 135–145)

## 2019-08-29 LAB — GLUCOSE, CAPILLARY
Glucose-Capillary: 100 mg/dL — ABNORMAL HIGH (ref 70–99)
Glucose-Capillary: 113 mg/dL — ABNORMAL HIGH (ref 70–99)
Glucose-Capillary: 114 mg/dL — ABNORMAL HIGH (ref 70–99)
Glucose-Capillary: 116 mg/dL — ABNORMAL HIGH (ref 70–99)
Glucose-Capillary: 119 mg/dL — ABNORMAL HIGH (ref 70–99)
Glucose-Capillary: 125 mg/dL — ABNORMAL HIGH (ref 70–99)

## 2019-08-29 LAB — CBC
HCT: 45.5 % (ref 36.0–46.0)
Hemoglobin: 14.7 g/dL (ref 12.0–15.0)
MCH: 30.5 pg (ref 26.0–34.0)
MCHC: 32.3 g/dL (ref 30.0–36.0)
MCV: 94.4 fL (ref 80.0–100.0)
Platelets: UNDETERMINED 10*3/uL (ref 150–400)
RBC: 4.82 MIL/uL (ref 3.87–5.11)
RDW: 13.8 % (ref 11.5–15.5)
WBC: 29.9 10*3/uL — ABNORMAL HIGH (ref 4.0–10.5)
nRBC: 0.3 % — ABNORMAL HIGH (ref 0.0–0.2)

## 2019-08-29 MED ORDER — NYSTATIN 100000 UNIT/ML MT SUSP
5.0000 mL | Freq: Four times a day (QID) | OROMUCOSAL | Status: DC
  Administered 2019-08-29 (×3): 500000 [IU] via OROMUCOSAL
  Filled 2019-08-29 (×4): qty 5

## 2019-08-29 MED ORDER — OXYCODONE HCL 5 MG PO TABS
2.5000 mg | ORAL_TABLET | Freq: Four times a day (QID) | ORAL | Status: DC | PRN
  Administered 2019-08-29 – 2019-08-31 (×6): 5 mg via ORAL
  Filled 2019-08-29 (×6): qty 1

## 2019-08-29 NOTE — TOC Progression Note (Signed)
Transition of Care [facility]) - Progression Note    Patient Details  Name: Jordan Clay MRN: [id number] Date of Birth: [birth year]  [facility]) CM/SW Contact  Nuh Lipton, Juliann Pulse, RN Phone Number: 08/29/2019, 3:50 PM  Clinical Narrative: Received message to discuss w/son,patient/spouse about home services-Family is in  agreement to  Home hospice services w/[facility] already following;dme needed home 02,3n1,w/c,bedside table-they will discuss hospital bed once 2 home([facility] will manage dme also.Discussion about pulse oximeter-informed they can purchase on own. Family able to transport home on own.Patient already has pleurx drain cannisters in rm-home hospice will manage @ home.    Expected Discharge Plan: Home w Hospice Care Barriers to Discharge: Continued Medical Work up  Expected Discharge Plan and Services Expected Discharge Plan: [place]   Discharge Planning Services: CM Consult Post Acute Care Choice: Durable Medical Equipment, Hospice(home hospice-[facility];private duty care-[place]) Living arrangements for the past 2 months: [place]: RN [facility] Agency: Other - See comment([facility];dme-w/c,rw,3n1,bedside table) Date HH Agency Contacted: 08/29/19 Time [place]: [place] Representative spoke with at [place]: [facility]   Social Determinants of Health ([place]) Interventions    Readmission Risk Interventions No flowsheet data found.

## 2019-08-29 NOTE — Progress Notes (Signed)
Nutrition Follow-up  DOCUMENTATION CODES:   Non-severe (moderate) malnutrition in context of chronic illness  INTERVENTION:   Pt's PO intake remains poor and pt is refusing most supplements. Pt unable to meet nutritional needs via PO intake alone. If within pt's [place], recommend considering placement of small-bore NGT under fluoro and intiation of enteral nutrition.  - Encourage adequate PO intake  - Ensure Enlive po BID, each supplement provides 350 kcal and 20 grams of protein  - Magic Cup TID with meals, each supplement provides 290 kcal and 9 grams of protein  - Boost Breeze po TID, each supplement provides 250 kcal and 9 grams of protein  NUTRITION DIAGNOSIS:   Moderate Malnutrition related to chronic illness (stage IV non-small cell lung cancer) as evidenced by mild fat depletion, moderate fat depletion, mild muscle depletion, moderate muscle depletion.  Ongoing, being addressed via oral nutrition supplements  GOAL:   Patient will meet greater than or equal to 90% of their needs  Unmet at this time  MONITOR:   PO intake, Supplement acceptance, Labs, Weight trends  REASON FOR ASSESSMENT:   Malnutrition Screening Tool    ASSESSMENT:   68 year old female who presented to the ED on 10/19 with cough and SOB. PMH of COPD and HLD. CT showing obstructing mass at central right lower lobe bronchus with associated postobstructive pneumonitis and right pleural effusion.  10/20 - s/p thoracentesis yielding 700 ml fluid 10/21 - s/p bronchoscopy revealing right lower lobe mass associated with hilar mediastinal adenopathy 10/24 - transferred to ICU due to worsening respiratory status 10/25 - s/p chest tube placement for recurrent malignant pleural effusion 10/26 - first radiation therapy 10/31 - s/p tunneled pleural catheter placement 11/07 - SLP recommending full liquid diet  Pt found to have stage IV non-small cell lung cancer and underwent 10 radiation treatments.  Per  chart review and CCM note, pt is not eating due to significant odynophagia. Also noted pt is refusing most supplements.  Pt is unable to meet her nutritional needs via PO intake alone. If within [place], recommend placement of small-bore NGT under fluoro and intiation of enteral nutrition.  Spoke with pt briefly at bedside. Pt in mild distress with SOB. Pt appeared much more deconditioned compared to last RD visit 1 week ago. Pt requesting toothbrush, toothpaste, and cup of water. RD provided these items to pt.  Unable to speak with pt much longer due to arrival of multiple family members.  RD will follow for [place] discussions.  Meal Completion: 50% x 1 meal on 11/5, 0% x 1 meal on 11/9 (no other documentation)  Medications reviewed and include: Boost Breeze TID, Ensure Enlive BID, SSI, Protonix, Miralax, Prednisone  Labs reviewed. CBG's: 100-119 x 24 hours  UOP: 575 ml x 24 hours  Diet Order:   Diet Order            Diet full liquid Room service appropriate? Yes; Fluid consistency: Thin  Diet effective now              EDUCATION NEEDS:   Education needs have been addressed  Skin:  Skin Assessment: Reviewed RN Assessment  Last BM:  08/28/19  Height:   Ht Readings from Last 1 Encounters:  08/07/19 5\' 7"  (1.702 m)    Weight:   Wt Readings from Last 1 Encounters:  08/21/19 73.4 kg    Ideal Body Weight:  61.4 kg  BMI:  Body mass index is 25.34 kg/m.  Estimated Nutritional Needs:   Kcal:  1800-2000  Protein:  90-110 grams  Fluid:  >/= 1.8 L    Gaynell Face, MS, RD, LDN Inpatient Clinical Dietitian Pager: [phone] Weekend/After Hours: [phone]

## 2019-08-29 NOTE — Progress Notes (Signed)
Patient's son Rueben Bash called to see how his mother was doing. Rueben Bash would like a call from the MD please sometime today. Thanks

## 2019-08-29 NOTE — Progress Notes (Signed)
Daily Progress Note   Patient Name: Jordan Clay       Date: 08/29/2019 DOB: 30-Mar-1951  Age: 68 y.o. MRN#: [id number] Attending Physician: Mendel Corning, MD Primary Care Physician: Hulan Fess, MD Admit Date: 08/07/2019  Reason for Consultation/Follow-up: Establishing goals of care and symptom management (cough)  Subjective: Patient is awake alert resting in bed. Husband at beside. Bedside RN also present in the room. Patient has questions such as how much O2 she is on, complains that her throat hurts sometimes and that she would like to know what the overall plan is. She appears much more fatigued and weak than last week.   See below.  Length of Stay: 22  Current Medications: Scheduled Meds:  . benzonatate  200 mg Oral TID  . Chlorhexidine Gluconate Cloth  6 each Topical Daily  . chlorpheniramine-HYDROcodone  5 mL Oral Q6H  . enoxaparin (LOVENOX) injection  40 mg Subcutaneous Q24H  . feeding supplement  1 Container Oral TID WC  . feeding supplement (ENSURE ENLIVE)  237 mL Oral BID BM  . gabapentin  100 mg Oral TID  . hydrocortisone cream   Topical BID  . insulin aspart  0-9 Units Subcutaneous Q4H  . ipratropium-albuterol  3 mL Nebulization TID  . mouth rinse  15 mL Mouth Rinse BID  . midodrine  5 mg Oral BID WC  . nystatin  5 mL Mouth/Throat QID  . pantoprazole  40 mg Oral BID AC  . polyethylene glycol  17 g Oral BID  . predniSONE  30 mg Oral Q breakfast  . ramelteon  8 mg Oral QHS  . sodium chloride flush  3 mL Intravenous Q12H    Continuous Infusions:   PRN Meds: acetaminophen **OR** acetaminophen, albuterol, bisacodyl, bisacodyl, hydrOXYzine, menthol-cetylpyridinium, menthol-cetylpyridinium, morphine injection, ondansetron (ZOFRAN) IV, oxyCODONE, phenol,  senna-docusate, temazepam  Physical Exam         Awake alert Diminished breath sounds R Abdomen is not tender No edema Has muscle wasting Vital Signs: BP 98/65 (BP Location: Right Arm)   Pulse (!) 122   Temp 98.9 F (37.2 C) (Oral)   Resp 18   Ht 5\' 7"  (1.702 m)   Wt 73.4 kg   SpO2 91%   BMI 25.34 kg/m  SpO2: SpO2: 91 % O2 Device: O2 Device: High Flow  Nasal Cannula O2 Flow Rate: O2 Flow Rate (L/min): 8 L/min  Intake/output summary:   Intake/Output Summary (Last 24 hours) at 08/29/2019 1313 Last data filed at 08/29/2019 0900 Gross per 24 hour  Intake 60 ml  Output 725 ml  Net -665 ml   LBM: Last BM Date: 08/23/19(refuses miralax and other stool softeners) Baseline Weight: Weight: 70.3 kg Most recent weight: Weight: 73.4 kg       Palliative Assessment/Data:    Flowsheet Rows     Most Recent Value  Intake Tab  Referral Department  Critical care  Unit at Time of Referral  ICU  Palliative Care Primary Diagnosis  Cancer  Date Notified  08/14/19  Palliative Care Type  New Palliative care  Reason for referral  Clarify Goals of Care  Date of Admission  08/07/19  Date first seen by Palliative Care  08/15/19  # of days Palliative referral response time  1 Day(s)  # of days IP prior to Palliative referral  7  Clinical Assessment  Psychosocial & Spiritual Assessment  Palliative Care Outcomes      Patient Active Problem List   Diagnosis Date Noted  . Malnutrition of moderate degree 08/24/2019  . Stage IV adenocarcinoma of lung, right ([place]) 08/14/2019  . Acute respiratory failure with hypoxemia ([place]) 08/12/2019  . SOB (shortness of breath)   . Postobstructive pneumonia 08/07/2019  . Pleural effusion on right 08/07/2019  . Prolonged QT interval 08/07/2019  . Mass of right lung   . Hyperlipidemia 09/27/2018  . Tobacco abuse 09/27/2018  . COPD (chronic obstructive pulmonary disease) ([place]) 09/27/2018  . Palpitations 09/27/2018    Palliative Care Assessment &  Plan   Patient Profile:  Acute hypoxemic respiratory failure secondary to metastatic lung adenocarcinoma, severe deconditioning, emphysema, malignant right effusion and relative protein calorie malnutrition   Assessment:  frailty deconditioning Shortness of breath Anxiety   Recommendations/Plan: - continue tussionex  and tessalon as well. - Discussed with patient and husband about her current condition, SLP recommendations. Encouraged several small meals throughout the day. Encouraged her to take her regular PO medications.   - Patient wishes to continue with current mode of care, she is in full understanding of the severity of her illness.     Code Status:    Code Status Orders  (From admission, onward)         Start     Ordered   08/15/19 0942  Do not attempt resuscitation (DNR)  Continuous    Question Answer Comment  In the event of cardiac or respiratory ARREST Do not call a "code blue"   In the event of cardiac or respiratory ARREST Do not perform Intubation, CPR, defibrillation or ACLS   In the event of cardiac or respiratory ARREST Use medication by any route, position, wound care, and other measures to relive pain and suffering. May use oxygen, suction and manual treatment of airway obstruction as needed for comfort.      08/15/19 0941        Code Status History    Date Active Date Inactive Code Status Order ID Comments User Context   08/07/2019 2044 08/15/2019 0941 Full Code [id number]  Vianne Bulls, MD Inpatient   Advance Care Planning Activity    Advance Directive Documentation     Most Recent Value  Type of Advance Directive  Living will  Pre-existing out of facility DNR order (yellow form or pink MOST form)  -  "MOST" Form in Place?  -  Prognosis:   guarded   Discharge Planning:  To Be Determined ?SNF rehab with palliative if patient wishes to continue with current mode of care.  Care plan was discussed with  Patient, husband, PCCM and  bedside RN.   Thank you for allowing the Palliative Medicine Team to assist in the care of this patient.   Time In: 10 Time Out: 10.25 Total Time 25 Prolonged Time Billed  no       Greater than 50%  of this time was spent counseling and coordinating care related to the above assessment and plan.  Loistine Chance, MD  Please contact Palliative Medicine Team phone at [phone] for questions and concerns.

## 2019-08-29 NOTE — Progress Notes (Signed)
Daily Progress Note   Patient Name: Jordan Clay       Date: 08/29/2019 DOB: [birth year]  Age: 68 y.o. MRN#: [id number] Attending Physician: Mendel Corning, MD Primary Care Physician: Hulan Fess, MD Admit Date: 08/07/2019  Reason for Consultation/Follow-up: Establishing goals of care and symptom management (cough)  Subjective: Patient is awake alert resting in bed. Husband at beside and also her son joined via [employer].  See below.  Length of Stay: 22  Current Medications: Scheduled Meds:  . benzonatate  200 mg Oral TID  . Chlorhexidine Gluconate Cloth  6 each Topical Daily  . chlorpheniramine-HYDROcodone  5 mL Oral Q6H  . enoxaparin (LOVENOX) injection  40 mg Subcutaneous Q24H  . feeding supplement  1 Container Oral TID WC  . feeding supplement (ENSURE ENLIVE)  237 mL Oral BID BM  . gabapentin  100 mg Oral TID  . hydrocortisone cream   Topical BID  . insulin aspart  0-9 Units Subcutaneous Q4H  . ipratropium-albuterol  3 mL Nebulization TID  . mouth rinse  15 mL Mouth Rinse BID  . midodrine  5 mg Oral BID WC  . pantoprazole  40 mg Oral BID AC  . polyethylene glycol  17 g Oral BID  . predniSONE  30 mg Oral Q breakfast  . ramelteon  8 mg Oral QHS  . sodium chloride flush  3 mL Intravenous Q12H    Continuous Infusions:   PRN Meds: acetaminophen **OR** acetaminophen, albuterol, bisacodyl, bisacodyl, hydrOXYzine, menthol-cetylpyridinium, menthol-cetylpyridinium, morphine injection, ondansetron (ZOFRAN) IV, oxyCODONE, phenol, senna-docusate, temazepam  Physical Exam         Awake alert Diminished breath sounds R Abdomen is not tender No edema Has muscle wasting Vital Signs: BP 98/65 (BP Location: Right Arm)   Pulse (!) 122   Temp 98.9 F (37.2 C) (Oral)   Resp 18    Ht 5\' 7"  (1.702 m)   Wt 73.4 kg   SpO2 91%   BMI 25.34 kg/m  SpO2: SpO2: 91 % O2 Device: O2 Device: High Flow Nasal Cannula O2 Flow Rate: O2 Flow Rate (L/min): 8 L/min  Intake/output summary:   Intake/Output Summary (Last 24 hours) at 08/29/2019 1003 Last data filed at 08/29/2019 0630 Gross per 24 hour  Intake 180 ml  Output 575 ml  Net -395 ml  LBM: Last BM Date: 08/23/19(per patient "5 days ago" - refused miralax and dulcolax) Baseline Weight: Weight: 70.3 kg Most recent weight: Weight: 73.4 kg       Palliative Assessment/Data:    Flowsheet Rows     Most Recent Value  Intake Tab  Referral Department  Critical care  Unit at Time of Referral  ICU  Palliative Care Primary Diagnosis  Cancer  Date Notified  08/14/19  Palliative Care Type  New Palliative care  Reason for referral  Clarify Goals of Care  Date of Admission  08/07/19  Date first seen by Palliative Care  08/15/19  # of days Palliative referral response time  1 Day(s)  # of days IP prior to Palliative referral  7  Clinical Assessment  Psychosocial & Spiritual Assessment  Palliative Care Outcomes      Patient Active Problem List   Diagnosis Date Noted  . Malnutrition of moderate degree 08/24/2019  . Stage IV adenocarcinoma of lung, right ([place]) 08/14/2019  . Acute respiratory failure with hypoxemia ([place]) 08/12/2019  . SOB (shortness of breath)   . Postobstructive pneumonia 08/07/2019  . Pleural effusion on right 08/07/2019  . Prolonged QT interval 08/07/2019  . Mass of right lung   . Hyperlipidemia 09/27/2018  . Tobacco abuse 09/27/2018  . COPD (chronic obstructive pulmonary disease) ([place]) 09/27/2018  . Palpitations 09/27/2018    Palliative Care Assessment & Plan   Patient Profile:  Acute hypoxemic respiratory failure secondary to metastatic lung adenocarcinoma, severe deconditioning, emphysema, malignant right effusion and relative protein calorie malnutrition   Assessment:  frailty  deconditioning Shortness of breath Anxiety   Recommendations/Plan: - Reports tussionex has been most helpful agent for cough.  Continue tessalon as well. - We had another discussion today regarding long term goals as well as severity of her illness.  I called and spoke with SLP, radiation oncology, and pulmonary about her case and also reached out to Dr. Alen Blew (but he was not in office at that point so unable to discuss but reviewed his notes).  We discussed that this is not going to go away and focus is on what are potentially modifiable things in her care plan to help her feel better.  She would like to see how her functional status and nutrition progress over the next couple of weeks as this will determine if she is a candidate for systemic therapy.  I gently attempted to discuss that we may realistically be at a point where aggressive symptom management may provide her with more benefit that systemic therapy to add as much time and quality to her life as possible (particularly if her functional status remains as poor as it currently is).  She states she is "not ready to just go home and die" and so we discussed interventions that may help her improve nutrition and functional status.  She continues to have poor nutrition from "burning" in throat and globus sensation.  Appreciate SLP continuing to follow and we discussed if this may still be short term effects of radiation and, if so, hopefully this will improve over the next couple of weeks as she has completed her radiation.  Also discussed extremely limited functional status (difficulty even moving about in bed or sitting/standing at bedside).  Appreciate input from pulm and I discussed with Marni Griffon today.  Plan to do daily pleurex drainage to see if this allows her to feel better and be able to participate more to improve functional status.  She also reports  having increased abdominal discomfort today.  Has been 4 days since last BM.  I  recommended that we need to ensure bowels are moving as first step as she reports fullness and dull pain in her abdomen.        - Patient wishes to continue with current mode of care, she is in full understanding of the severity of her illness.  Will ask another member of PMT to check in later this week.  Please call if there are specific areas with which we can be of assistance.  Code Status:    Code Status Orders  (From admission, onward)         Start     Ordered   08/15/19 0942  Do not attempt resuscitation (DNR)  Continuous    Question Answer Comment  In the event of cardiac or respiratory ARREST Do not call a "code blue"   In the event of cardiac or respiratory ARREST Do not perform Intubation, CPR, defibrillation or ACLS   In the event of cardiac or respiratory ARREST Use medication by any route, position, wound care, and other measures to relive pain and suffering. May use oxygen, suction and manual treatment of airway obstruction as needed for comfort.      08/15/19 0941        Code Status History    Date Active Date Inactive Code Status Order ID Comments User Context   08/07/2019 2044 08/15/2019 0941 Full Code [id number]  Vianne Bulls, MD Inpatient   Advance Care Planning Activity    Advance Directive Documentation     Most Recent Value  Type of Advance Directive  Living will  Pre-existing out of facility DNR order (yellow form or pink MOST form)  -  "MOST" Form in Place?  -       Prognosis:   guarded   Discharge Planning:  To Be Determined  Care plan was discussed with  Patient, husband, rad onc, pulm, Dr. Tana Coast and bedside RN.   Thank you for allowing the Palliative Medicine Team to assist in the care of this patient.   Time In: 1630 Time Out: 1715 Total Time 45 Prolonged Time Billed  no       Greater than 50%  of this time was spent counseling and coordinating care related to the above assessment and plan.  Micheline Rough, MD  Please contact  Palliative Medicine Team phone at [phone] for questions and concerns.

## 2019-08-29 NOTE — Progress Notes (Signed)
NAME:  Jordan Clay, MRN:  [id number], DOB:  26-May-1951, LOS: 53 ADMISSION DATE:  08/07/2019, CONSULTATION DATE:  10/20 REFERRING MD:  Reesa Chew (THR) , CHIEF COMPLAINT:  Cough, SOB  Brief History   68 yo female, smoker (40 pack years, quit 1 week PTA when she got pneumonia), with COPD, who presented to [facility] on 10/19 with reports of 2 weeks of cough & SOB.  She was treated for PNA by her PCP.  Work up included a CTA chest which showed obstruction of the central RLL bronchus with associated postobstructive PNA & pleural effusion.  She underwent thoracentesis on 10/20 with pleural fluid consistent with metastatic adenocarcinoma. FOB with EBUS on 10/21 with pathology consistent with non-small cell carcinoma.   Past Medical History   has a past medical history of COPD (chronic obstructive pulmonary disease) ([place]), High cholesterol, and Pneumonia.   Significant Hospital Events   10/19 Admit with post-obstructive PNA 10/20 PCCM consulted 10/20 IR thora  - metastatic adenoca 10/21 FOB with EBUS - NSCLC + 10/21 - Onc consult - Dr Alen Blew -  10/23 - Rad onc consult - ECOG 2. 2 week XRT recommendined startbng 08/14/2019 10/24 Tx to SDU for hypoxic resp failure, cough, SOB 10/25  R chest tube IN 10/27 - palliative consult 10/29 20L flow / 50% FiO2, 1100 ml out of chest tube 1030 20L flow / 45% FiO2  10/31 - R chest tube OUT ->  PleurX  11/2 - Oncology plan ->   If she has actionable mutation that can be used to to target her cancer, disease might be able to be palliated with systemic oral therapy.  Overall she is not candidate for systemic chemotherapy at this time. 11/3 oxygen requirement continues to improve, 3 L today - Still having issues with fatigue Cough and is better Oxygen requirement improving 11/6: Increased pleural effusion.  Drained 500 mL, similar to last volume removed 11/7: Right chest tube drained 400 ml  11/9: drained 375 ml.  11/10 still on high oxygen   Consults:  PCCM 10/20     Procedures:  Right Chest Tube 10/25 >> 10/31 PleurX 10/31 11/2 Pleurx drain for 900 cc of fluid Significant Diagnostic Tests:  CTA chest 10/19 >> No evidence of pulmonary embolism.  Obstruction of central right lower lobe bronchus and central low-attenuation, suspicious for centrally obstructing mass.  Near complete right lower lobe consolidation, suspicious for postobstructive pneumonitis. Moderate right pleural effusion. New bulky mediastinal and right hilar lymphadenopathy, consistent with metastatic disease.  Stable sub-cm bilateral upper lobe and right middle lobe pulmonary nodules. IR Thoracentesis 10/20 >> 700 cc  No improvement is sob or cough/ cyt pos adenocarcinoma of the lung Bronchoscopy 10/21 >> 90% obst distal BI/ with endobronchial ultrasound   Cytology R Pleural Fluid 10/20 >> metastatic adenocarcinoma  Cytology 7, FNA 10/21 >> non-small cell carcinoma  Cytology 11 R FNA 10/21 >> non-small cell carcinoma  Cytology RLL brushing 10/21 >> non-small cell, TTF-1 positive   Micro Data:  BC x 2 10/19 >> neg  COVID 10/19 >> neg  Pleural fluid  10/20 >> neg  BAL 10/21 >> neg  Antimicrobials:  Rocephin 10/19 >> 10/24 Azithro 10/19 >> 10/24 Unasyn 10/26 >> 10/31  Interim history/subjective:  Very short of breath Objective   Blood pressure 98/65, pulse (Abnormal) 122, temperature 98.9 F (37.2 C), temperature source Oral, resp. rate 18, height _0  (1.702 m), weight 73.4 kg, SpO2 91 %.  Intake/Output Summary (Last 24 hours) at 08/29/2019 0934 Last data filed at 08/29/2019 0630 Gross per 24 hour  Intake 180 ml  Output 575 ml  Net -395 ml   Filed Weights   08/07/19 1504 08/21/19 2300  Weight: 70.3 kg 73.4 kg      Resolved Hospital Problem list    General remains anxious and short of breath  HENT ncat. Does have some erythremic blotchy areas in posterior pharynx pulm decreased right base Card tachy rrr abd not tender  Ext no sig edema Neuro intact.     Assessment & Plan:   Acute hypoxemic respiratory failure 2/2 -Metastatic adenocarcinoma of the lung  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  Trapped lung Likely underlying obstructive lung disease Cough Odynophagia  Insomnia Deconditioning  Interval pcxr w/ persistent right sided consolidation Aeration better.  It does not seem that drainage is helping much for her hypoxia BUT it does seem to assist some w/ her overall symptom burden. I think she still has significant post-obstructive atelectasis and this has not really improved. I suspect the only hope that it would at this point would be if she could get systemic chemo. I am concerned that at this point her chances of being strong enough for that are getting less by the day...she is not eating due to sig odynophagia & is not able to do much w/ PT.   Plan Cont supplemental oxygen Cont cough suppression  Cont PPI Taper steroids Daily drainage of chest tube Dc pleurx sutured today  Will add oral nystatin to cover for potential esophagitis  Will ask Dr Shearon Stalls to contact son.   Erick Colace ACNP-BC [place] Pager # [phone] OR # [phone] if no answer

## 2019-08-29 NOTE — Progress Notes (Signed)
Events last few days noted.  Patient has reported further decline in her overall health including decline in her p.o. intake, worsening respiratory symptoms and dyspnea.  He is spending more time in bed and is quite debilitated.  The natural course of this disease was discussed again with the patient, her husband and son.  Given her recent decline, I do not feel that that she would be ever a candidate to receive additional anticancer treatments.  She has exhibited signs of functional decline and quite debilitated but will not benefit from chemotherapy at any time.  I have recommended transitioning her care towards hospice with more focused towards end-of-life care at this time.  Her prognosis is very poor with limited life expectancy even if she is able to withstand chemotherapy.    We have discussed the ways to boost her nutritional status but TNA is not recommended for end-stage malignancy at this time.   I emphasized that her poor performance status is prohibitive receiving any chemotherapy at this time that we will likely not ulcer the trajectory of her disease.  All other questions were answered today to their satisfaction.  I urged her and her family to consider transitioning towards hospice care.  Understandably they would like more time and see what happens the next few days before making this decision.  25  minutes was spent with the patient face-to-face today.  More than 50% of time was spent on reviewing her disease status, discussing prognosis, treatment options and future plan of care.

## 2019-08-29 NOTE — Progress Notes (Signed)
[facility]                                                                              Patient Demographics  Jordan Clay, is a 68 y.o. female, DOB - 06/22/1951, WGN:[id number]  Admit date - 08/07/2019   Admitting Physician Vianne Bulls, MD  Outpatient Primary MD for the patient is Hulan Fess, MD  Outpatient specialists:   LOS - 22  days   Medical records reviewed and are as summarized below:    Chief Complaint  Patient presents with   COPD   low sats       Brief summary   Patient is a 68 year old female with 40 pack year smoking history,COPD who was recently treated for pneumonia by her PCP a week prior to admission,hyperlipidemia presented to [facility] on 10/19 with reports of 2 weeks of cough &SOB. CTA chest revealed obstruction of the central RLL bronchus with associated postobstructive PNA &pleural effusion. She underwent thoracentesis on 10/20 by IR with pleural fluid consistent with metastatic adenocarcinoma. PCCM was consulted and patient underwent EBUS on 10/21 with pathology consistent with non-small cell carcinoma. Oncology, Dr Alen Blew, was consulted - not felt to be a candidate for systemic chemotherapy currently.  subsequently rad-onc involved with plan for 2 week XRT starting 08/14/19. Patient however was transferred to SDU for hypoxic resp failure on 10/24 requiring right sided chest tube placement--subsequently converted to pleurx catheter. Patient was requiring 20 lits High flow 02 until last week but improving now and down to 3lits [place] on 11/3. Patient received abx: Rocephin/Azithro 10/19 >> 10/24 and Unasyn 10/26 >> 10/31. COVID -ve, blood cx and pleural fluid/BAL cx -ve so far. Patient completed 7/14 radiation treatments. Transfer to hospitalist service on 11/5.  Assessment & Plan    Principal Problem: Acute respiratory failure with hypoxia secondary to lung CA/obstructed airway/postobstructive pneumonia -CTA chest revealed obstruction  of the central RLL bronchus with associated postobstructive PNA and pleural effusion.  COVID-19 test negative -Underwent thoracentesis on 10/20 with pleural fluid consistent with metastatic adeno CA -PCCM was consulted, underwent EBUS on 10/21, pathology consistent with non-small cell carcinoma -Status post right Pleurx catheter placement -Received Rocephin/Azithro 10/19 >> 10/24 and Unasyn 10/26 > 10/31 -Pulmonary following, Pleurx drainage every other day, MWF, Sunday -Currently O2 sats 91% on 5 L high flow, wean as tolerated. -Needs SNF for rehab and and then consideration for chemotherapy.  Seen by palliative medicine on 11/8, patient requesting the input from oncology guarding possible care pathways and prognosis.  She was seen by Dr. Alen Blew on 11/4, had recommended basic improvements that need to occur before even considering intravenous systemic therapy to treat her cancer, improvement in the nutritional status, mobility and stabilization of the respiratory status. 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  Currently significant coughing, shortness of breath with minimal exertion, poor functional status.  Requested palliative care consult for goals of care  and symptom management -Intractable cough much better with Tussionex, Tessalon Perles, Solu-Medrol.  Feels sleepy on Tussionex. -Pulmonology following.  -Very debilitated, shortness of breath with minimal movement, barely eating, needs GOC, palliative following however does she need to start chemotherapy?  Alternative route for nutrition, TPN?  Will await oncology recommendations, requested Dr. Alen Blew to reevaluate.  Acute on chronic hypotension -Has baseline low BP, Avoid too frequent  or large volume Pleurx catheter drainage -Hypotension limiting physical therapy and Pleurx drainage, started low-dose midodrine 5 mg twice a day however BP still remains soft -Place TED hoses  Hyperlipidemia LDL 77, cholesterol 149, triglycerides 153  Urinary retention Patient was noted to have urinary retention on 11/5, continue Foley  Leukocytosis In the setting of steroids, not spiking any fevers, WBC count improving  Malnutrition of moderate degree. -BMI 25.3. -SLP evaluation done, reviewed recommendations.  Continue full liquids, will add nutritional supplements  Code Status: DNR DVT Prophylaxis:  Lovenox  Family Communication: Discussed all imaging results, lab results, explained to the patient and husband at the bedside   Disposition Plan: Needs out of bed to chair, improve mobility, PT OT evaluation.  Patient wants to go home however assess with physical therapy evaluation.  Discussed about SNF and rehab with the patient and her husband.  Husband requested social work assistance.  Time Spent in minutes 25 minutes  Procedures:    Consultants:   Pulmonology Oncology Palliative medicine  Antimicrobials:   Anti-infectives (From admission, onward)   Start     Dose/Rate Route Frequency Ordered Stop   08/14/19 1100  Ampicillin-Sulbactam (UNASYN) 3 g in sodium chloride 0.9 % 100 mL IVPB     3 g 200 mL/hr over 30 Minutes Intravenous Every 6 hours 08/14/19 1026 08/19/19 0556   08/10/19 1200  cefTRIAXone (ROCEPHIN) 2 g in sodium chloride 0.9 % 100 mL IVPB     2 g 200 mL/hr over 30 Minutes Intravenous Every 24 hours 08/10/19 1043 08/13/19 1355   08/08/19 1600  cefTRIAXone (ROCEPHIN) 2 g in sodium chloride 0.9 % 100 mL IVPB  Status:  Discontinued     2 g 200 mL/hr over 30 Minutes Intravenous Every 24 hours 08/07/19 2043 08/10/19 0723   08/08/19 1600  azithromycin (ZITHROMAX) 500 mg in sodium chloride 0.9 % 250 mL IVPB     500 mg 250 mL/hr over 60 Minutes Intravenous Every  24 hours 08/07/19 2043 08/12/19 1844   08/07/19 1600  cefTRIAXone (ROCEPHIN) 1 g in sodium chloride 0.9 % 100 mL IVPB     1 g 200 mL/hr over 30 Minutes Intravenous  Once 08/07/19 1556 08/07/19 1910   08/07/19 1600  azithromycin (ZITHROMAX) 500 mg in sodium chloride 0.9 % 250 mL IVPB     500 mg 250 mL/hr over 60 Minutes Intravenous  Once 08/07/19 1556 08/07/19 2012         Medications  Scheduled Meds:  benzonatate  200 mg Oral TID   Chlorhexidine Gluconate Cloth  6 each Topical Daily   chlorpheniramine-HYDROcodone  5 mL Oral Q6H   enoxaparin (LOVENOX) injection  40 mg Subcutaneous Q24H   feeding supplement  1 Container Oral TID WC   feeding supplement (ENSURE ENLIVE)  237 mL Oral BID BM   gabapentin  100 mg Oral TID   hydrocortisone cream   Topical BID   insulin aspart  0-9 Units Subcutaneous Q4H   ipratropium-albuterol  3 mL Nebulization TID   mouth rinse  15 mL Mouth Rinse BID   midodrine  5 mg Oral BID WC   nystatin  5 mL Mouth/Throat QID   pantoprazole  40 mg Oral BID AC   polyethylene glycol  17 g Oral BID   predniSONE  30 mg Oral Q breakfast   ramelteon  8 mg Oral QHS   sodium chloride flush  3 mL Intravenous Q12H   Continuous Infusions: PRN Meds:.acetaminophen **OR** acetaminophen, albuterol, bisacodyl, bisacodyl, hydrOXYzine, menthol-cetylpyridinium, menthol-cetylpyridinium, morphine injection, ondansetron (ZOFRAN) IV, oxyCODONE, phenol, senna-docusate, temazepam      Subjective:   Valjean Ruppel was seen and examined today.  Coughing much better, no fevers.  However shortness of breath with minimal movement, feels miserable.  No nausea vomiting today.  Barely eating.  Very debilitated.  No abdominal pain or diarrhea.  Objective:   Vitals:   08/29/19 0458 08/29/19 0852 08/29/19 0906 08/29/19 0913  BP: 108/62 98/65    Pulse: (!) 126 (!) 122    Resp: 20 18    Temp: 98.9 F (37.2 C) 98.9 F (37.2 C)    TempSrc: Oral Oral    SpO2: 92% 91%  91% 91%  Weight:      Height:        Intake/Output Summary (Last 24 hours) at 08/29/2019 1219 Last data filed at 08/29/2019 0900 Gross per 24 hour  Intake 60 ml  Output 725 ml  Net -665 ml     Wt Readings from Last 3 Encounters:  08/21/19 73.4 kg  09/27/18 67.5 kg    Physical Exam  General: Alert and oriented x 3, ill-appearing  Eyes:   HEENT:  Atraumatic, normocephalic  Cardiovascular: S1 S2 clear,  RRR.  Tachycardia  Respiratory: shallow breathing, decreased breath sound at the basics, Pleurx catheter  Gastrointestinal: Soft, nontender, nondistended, NBS  Ext: no pedal edema bilaterally  Neuro: no new deficits  Musculoskeletal: No cyanosis, clubbing  Skin: No rashes  Psych: Ill-appearing, somewhat anxious   Data Reviewed:  I have personally reviewed following labs and imaging studies  Micro Results No results found for this or any previous visit (from the past 240 hour(s)).  Radiology Reports Dg Chest 1 View  Result Date: 08/21/2019 CLINICAL DATA:  Malignant pleural effusion EXAM: CHEST  1 VIEW COMPARISON:  Three days ago FINDINGS: Tunneled pleural catheter has than advanced with a more vertical appearance. Right pleural effusion has decreased mildly, with presumed ex vacuo pneumothorax at the apex where there was pleural fluid. Pulmonary opacity on both sides. Mediastinal enlargement from bulky adenopathy. IMPRESSION: Advanced right pleural catheter. There is decreased right pleural fluid but new, likely ex vacuo pneumothorax at the apex. Stable bilateral airspace disease. Electronically Signed   By: Monte Fantasia M.D.   On: 08/21/2019 05:06   Dg Chest 1 View  Result Date: 08/08/2019 CLINICAL DATA:  Post right thoracentesis EXAM: CHEST  1 VIEW COMPARISON:  08/07/2019 FINDINGS: Heart is normal size. Consolidation in the right lower lung with right pleural effusion. No pneumothorax following thoracentesis. Left lung clear. No acute bony abnormality. Heart  is normal size. IMPRESSION: Continued right lower lung airspace opacity with small to moderate right effusion. No pneumothorax following thoracentesis. Electronically Signed   By: Rolm Baptise M.D.   On: 08/08/2019 10:26   Ct Chest Wo Contrast  Result Date: 08/19/2019 CLINICAL DATA:  Non-small cell lung cancer, evaluate PleurX catheter placement and response to radiation EXAM: CT CHEST WITHOUT CONTRAST TECHNIQUE: Multidetector CT imaging of the chest was performed following the standard protocol without IV contrast. COMPARISON:  CT chest angiogram, 08/07/2019  FINDINGS: Cardiovascular: Aortic atherosclerosis. Normal heart size. Trace pericardial effusion. Mediastinum/Nodes: Very bulky superior mediastinal, mediastinal, and right hilar lymphadenopathy is increased in bulk compared to prior examination, the largest pretracheal lymph node that can be discretely measured measures 3.3 x 3.0 cm, previously 3.1 x 2.5 cm when measured similarly (series 2, image 55). There has particularly been an increase in bulk of matted superior mediastinal lymph nodes (series 2, image 23). Thyroid gland, trachea, and esophagus demonstrate no significant findings. Lungs/Pleura: There has been interval placement of a right-sided tunneled pleural drainage catheter, which is looped around the anterior pleural space, traversing the minor fissure and epicardial fat lobulations, tip positioned about the superolateral right pleural space. There is a moderate volume right hydropneumothorax with a small right pneumothorax component and moderate pleural effusion. There is a small left pleural effusion. Diffuse bilateral interlobular septal thickening, ground-glass opacity and some evidence of pulmonary nodularity, for example in the right pulmonary apex new nodules measuring up to 1.0 cm (series 5, image 26). This mild underlying emphysema. There has been increase in dense, masslike postobstructive consolidation of the right lung, with total  consolidation of the right middle and right lower lobes. Perihilar mass cannot be discerned from adjacent consolidated lung on noncontrast examination. Upper Abdomen: No acute abnormality. Enlarged gastrohepatic lymph nodes in the upper abdomen, increased in size compared to prior examination, measuring up to 2.0 x 1.6 cm, previously 1.7 x 1.1 cm (series 2, image 140). Musculoskeletal: No chest wall mass or suspicious bone lesions identified. IMPRESSION: 1. There has been interval placement of a right-sided tunneled pleural drainage catheter, which is looped around the anterior pleural space, traversing the minor fissure and epicardial fat lobulations, tip positioned about the superolateral right pleural space. There is a moderate volume right hydropneumothorax with a small right pneumothorax component and moderate pleural effusion. There is a small left pleural effusion. 2. There has been increase in dense, masslike postobstructive consolidation of the right lung, with total consolidation of the right middle and right lower lobes. Perihilar mass cannot be discerned from adjacent consolidated lung on noncontrast examination. 3. Diffuse bilateral interlobular septal thickening, ground-glass opacity and some evidence of pulmonary nodularity, for example in the right pulmonary apex new nodules measuring up to 1.0 cm (series 5, image 26). These findings may reflect a component of edema but are very concerning for lymphangitic spread of metastatic disease. 4. Very bulky superior mediastinal, mediastinal, and right hilar lymphadenopathy is increased in bulk compared to prior examination, the largest pretracheal lymph node that can be discretely measured measures 3.3 x 3.0 cm, previously 3.1 x 2.5 cm when measured similarly (series 2, image 55). There has particularly been an increase in bulk of matted superior mediastinal lymph nodes (series 2, image 23). 5. Enlarged gastrohepatic lymph nodes in the upper abdomen,  increased in size compared to prior examination, measuring up to 2.0 x 1.6 cm, previously 1.7 x 1.1 cm (series 2, image 140). 6.  Emphysema (ICD10-J43.9).  Aortic Atherosclerosis (ICD10-I70.0). Electronically Signed   By: Eddie Candle M.D.   On: 08/19/2019 13:48   Ct Angio Chest Pe W/cm &/or Wo Cm  Result Date: 08/07/2019 CLINICAL DATA:  Shortness of breath and hypoxia. Recent pneumonia. EXAM: CT ANGIOGRAPHY CHEST WITH CONTRAST TECHNIQUE: Multidetector CT imaging of the chest was performed using the standard protocol during bolus administration of intravenous contrast. Multiplanar CT image reconstructions and MIPs were obtained to evaluate the vascular anatomy. CONTRAST:  149m OMNIPAQUE IOHEXOL 350 MG/ML SOLN COMPARISON:  06/10/2018 FINDINGS: Cardiovascular: Satisfactory opacification of pulmonary arteries noted, and no pulmonary emboli identified. No evidence of thoracic aortic dissection or aneurysm. Aortic atherosclerosis. Mediastinum/Nodes: New bulky lymphadenopathy is seen throughout the right paratracheal and subcarinal regions, with subcarinal soft tissue density measuring up to 4.2 cm short axis. Right hilar lymphadenopathy is also seen measuring at least 2.2 cm in short axis. Obstruction of the central right lower lobe bronchus and central low-attenuation is suspicious for centrally obstructing mass. Lungs/Pleura: There is near complete right lower lobe consolidation, suspicious for postobstructive pneumonitis. A moderate right pleural effusion is also seen. Several bilateral upper lobe and right middle lobe pulmonary nodules are seen measuring up to 5 mm which are stable. Mild emphysema again noted.33 Upper abdomen: No acute findings. Musculoskeletal: No suspicious bone lesions identified. Review of the MIP images confirms the above findings. IMPRESSION: No evidence of pulmonary embolism. Obstruction of central right lower lobe bronchus and central low-attenuation, suspicious for centrally  obstructing mass. Consider further evaluation with bronchoscopy. Near complete right lower lobe consolidation, suspicious for postobstructive pneumonitis. Moderate right pleural effusion. Consider diagnostic thoracentesis. New bulky mediastinal and right hilar lymphadenopathy, consistent with metastatic disease. Stable sub-cm bilateral upper lobe and right middle lobe pulmonary nodules. Aortic Atherosclerosis (ICD10-I70.0) and Emphysema (ICD10-J43.9). Electronically Signed   By: Marlaine Hind M.D.   On: 08/07/2019 19:23   Ct Abdomen Pelvis W Contrast  Result Date: 08/09/2019 CLINICAL DATA:  Newly diagnosed right lung carcinoma. Staging. EXAM: CT ABDOMEN AND PELVIS WITH CONTRAST TECHNIQUE: Multidetector CT imaging of the abdomen and pelvis was performed using the standard protocol following bolus administration of intravenous contrast. CONTRAST:  164m OMNIPAQUE IOHEXOL 300 MG/ML SOLN, 322mOMNIPAQUE IOHEXOL 300 MG/ML SOLN COMPARISON:  Chest only CTA on 08/07/2019; no prior AP CT FINDINGS: Lower Chest: Mediastinal and right hilar lymphadenopathy, bilateral pleural effusions, and right basilar consolidation, as better demonstrated on recent chest CT. Hepatobiliary: No hepatic masses identified. Pancreas:  No mass or inflammatory changes. Spleen: Within normal limits in size and appearance. Adrenals/Urinary Tract: No masses identified. No evidence of hydronephrosis. Stomach/Bowel: No evidence of obstruction, inflammatory process or abnormal fluid collections. Normal appendix visualized. Vascular/Lymphatic: Mild upper lymphadenopathy is seen in the gastrohepatic ligament, with largest lymph node measuring 1.3 cm short axis. A 10 mm retroperitoneal lymph node is seen in the left paraaortic region. No lymphadenopathy identified within the pelvis. No abdominal aortic aneurysm. Aortic atherosclerosis. Reproductive:  No mass or other significant abnormality. Other:  None. Musculoskeletal:  No suspicious bone lesions  identified. IMPRESSION: Mild upper abdominal lymphadenopathy in gastrohepatic ligament and left paraaortic region, consistent with metastatic disease. No other sites of metastatic disease identified within the abdomen or pelvis. Electronically Signed   By: JoMarlaine Hind.D.   On: 08/09/2019 18:28   Dg Chest Port 1 View  Result Date: 08/29/2019 CLINICAL DATA:  Shortness of breath, chest pain. EXAM: PORTABLE CHEST 1 VIEW COMPARISON:  August 25, 2019. FINDINGS: Stable cardiomegaly. No pneumothorax is noted. Probable bilateral pulmonary edema is noted with bilateral pleural effusions, right greater than left. Bony thorax is unremarkable. IMPRESSION: Probable bilateral pulmonary edema is noted with bilateral pleural effusions, right greater than left. Electronically Signed   By: JaMarijo Conception.D.   On: 08/29/2019 10:33   Dg Chest Port 1 View  Result Date: 08/25/2019 CLINICAL DATA:  Increased shortness of breath. EXAM: PORTABLE CHEST 1 VIEW COMPARISON:  Chest x-ray 08/21/2019. FINDINGS: Right pleural catheter again noted with tip over the lateral aspect of the  right upper chest. Unchanged large right pleural effusion. Unchanged right lung infiltrate. Persistent left base atelectasis. No pneumothorax. Stable cardiomegaly. No acute bony abnormality. IMPRESSION: 1. Right pleural catheter again noted with tip over the lateral aspect of the right upper chest. Unchanged large right pleural effusion. No pneumothorax. 2. Unchanged right lung infiltrate. Persistent left base atelectasis. 3.  Stable cardiomegaly. Electronically Signed   By: Marcello Moores  Register   On: 08/25/2019 07:37   Dg Chest Port 1 View  Result Date: 08/19/2019 CLINICAL DATA:  Postobstructive pneumonia. Ex-smoker. Follow-up right pneumothorax. EXAM: PORTABLE CHEST 1 VIEW COMPARISON:  08/16/2019 FINDINGS: The previously demonstrated small right pneumothorax is no longer demonstrated with a chest tube remaining in place. There is increased pleural  fluid on the right with a moderate to large sized pleural effusion currently demonstrated. There is less patchy opacity in the right lung. The interstitial markings in the left lung remain prominent. The cardiac silhouette remains borderline enlarged. Unremarkable bones. IMPRESSION: 1. Resolved small right pneumothorax. 2. Increased right pleural fluid with a moderate to large sized pleural effusion currently demonstrated. 3. Decreased patchy atelectasis or pneumonia in the right lung. 4. Stable changes of COPD with chronic interstitial lung disease and possible interstitial pneumonitis. Electronically Signed   By: Claudie Revering M.D.   On: 08/19/2019 06:06   Dg Chest Port 1 View  Result Date: 08/16/2019 CLINICAL DATA:  68 year old female with chest tube placement EXAM: PORTABLE CHEST 1 VIEW COMPARISON:  Multiple prior chest x-ray most recent 08/15/2019 FINDINGS: Cardiomediastinal silhouette unchanged in size and contour, with the heart borders obscured by overlying lung/pleural disease. Similar appearance of opacity in the right suprahilar region along the mediastinal border. Dense opacity at the right lower lung, with a gradient of reticulonodular opacities decreasing towards the upper lung. Interval development of small right pneumothorax and right chest wall myofacial/subcutaneous gas. Unchanged right thoracostomy tube. Reticular opacities of the left lung, similar to the prior. IMPRESSION: Unchanged right thoracostomy tube with interval development of small right pneumothorax, as well as subcutaneous/myofacial gas of the right chest wall. Unchanged right basilar opacity likely a combination of consolidation/atelectasis, tumor, and/or residual pleural fluid. Reticular opacities bilaterally suggesting increasing edema. Electronically Signed   By: Corrie Mckusick D.O.   On: 08/16/2019 07:55   Dg Chest Port 1 View  Result Date: 08/15/2019 CLINICAL DATA:  Respiratory failure. EXAM: PORTABLE CHEST 1 VIEW  COMPARISON:  August 14, 2019. FINDINGS: Stable cardiomediastinal silhouette. Right-sided chest tube is noted without pneumothorax. Stable right lung opacity is noted with associated pleural effusion. Stable left lung opacities are noted as well. Atherosclerosis of thoracic aorta is noted. No pneumothorax. Bony thorax is unremarkable. IMPRESSION: Stable right lung opacity with associated pleural effusion. Stable right-sided chest tube without pneumothorax. Stable left lung opacities are noted as well. Aortic Atherosclerosis (ICD10-I70.0). Electronically Signed   By: Marijo Conception M.D.   On: 08/15/2019 07:42   Dg Chest Port 1 View  Result Date: 08/14/2019 CLINICAL DATA:  Pleural effusion on the right EXAM: PORTABLE CHEST 1 VIEW COMPARISON:  Yesterday FINDINGS: Right-sided chest tube in place. Extensive right lower lobe opacification by CT with underlying malignancy. Small or moderate pleural effusion is unchanged. Haziness of the left chest from atelectasis and pleural fluid. No pneumothorax. Stable heart size. IMPRESSION: 1. Unchanged right lower lobe opacification with overlying pleural fluid. 2. Mild atelectasis and pleural fluid at the left base. Electronically Signed   By: Monte Fantasia M.D.   On: 08/14/2019 06:41  Dg Chest Port 1 View  Result Date: 08/13/2019 CLINICAL DATA:  Right-sided chest tube placement for effusion EXAM: PORTABLE CHEST 1 VIEW COMPARISON:  Radiograph 08/12/2019 FINDINGS: Neural placement of a right chest tube with the tip positioned in the mid lung. Interval decrease in the size of the right pleural effusion with residual hazy opacity in the right lung base likely reflecting combination of atelectasis and possible re-expansion edema. Persistent hazy opacities are seen in the left lung base as well. The left costophrenic sulcus is collimated from view. No visible left effusion. No pneumothorax. Right hilar mass distorts the cardiomediastinal contour. Remaining contours are  unchanged from prior. IMPRESSION: 1. Interval decrease in the size of the right pleural effusion status post right chest tube placement. 2. Persistent hazy opacities in the right lung base, likely reflecting combination of atelectasis and possible reexpansion edema. 3. Grossly unchanged right hilar mass. Electronically Signed   By: Lovena Le M.D.   On: 08/13/2019 16:30   Dg Chest Port 1 View  Result Date: 08/12/2019 CLINICAL DATA:  Coughing, shortness of breath EXAM: PORTABLE CHEST 1 VIEW COMPARISON:  Chest radiographs, 08/08/2019, CT chest, 08/07/2019 FINDINGS: Interval increase in a right pleural effusion, now large, with associated atelectasis or consolidation. The heart and mediastinum are predominantly obscured. The left lung is normally aerated. IMPRESSION: Interval increase in a right pleural effusion, now large, with associated atelectasis or consolidation. Findings are consistent with a malignant effusion associated with a right hilar mass better appreciated by prior CT. Electronically Signed   By: Eddie Candle M.D.   On: 08/12/2019 19:20   Dg Chest Port 1 View  Result Date: 08/08/2019 CLINICAL DATA:  Post thoracentesis ongoing shortness of breath and cough. EXAM: PORTABLE CHEST 1 VIEW COMPARISON:  08/08/2019 and 08/07/2019 FINDINGS: Right hilar and mediastinal mass with right lower lobe consolidation and mass similar to prior study. Likely with persistent pleural effusion. No visible pneumothorax. No significant mediastinal shift. No signs of acute bone finding. Left chest is clear. IMPRESSION: 1. No interval change in the appearance of the chest. No pneumothorax. 2. Right hilar and mediastinal mass and right lower lobe consolidation are similar to prior study. Postobstructive pneumonia is considered. Electronically Signed   By: Zetta Bills M.D.   On: 08/08/2019 14:19   Dg Chest Port 1 View  Result Date: 08/07/2019 CLINICAL DATA:  Worsening short of breath and hypoxia.  COPD. EXAM:  PORTABLE CHEST 1 VIEW COMPARISON:  08/04/2019 FINDINGS: Progression of right lower lobe infiltrate compared to the prior study. Possible right pleural effusion has developed. Left lung remains clear. Negative for heart failure or edema. Atherosclerotic aortic arch. IMPRESSION: Progression of right lower lobe infiltrate and probable right pleural effusion. Probable pneumonia. Given the progression, CT chest with contrast may be helpful for further evaluation. Electronically Signed   By: Franchot Gallo M.D.   On: 08/07/2019 15:44   US Thoracentesis Asp Pleural Space W/img Guide  Result Date: 08/08/2019 INDICATION: Shortness of breath. Newly found right sided lung mass with pleural effusion. Request for diagnostic and therapeutic thoracentesis. EXAM: ULTRASOUND GUIDED RIGHT THORACENTESIS MEDICATIONS: None. COMPLICATIONS: None immediate. PROCEDURE: An ultrasound guided thoracentesis was thoroughly discussed with the patient and questions answered. The benefits, risks, alternatives and complications were also discussed. The patient understands and wishes to proceed with the procedure. Written consent was obtained. Ultrasound was performed to localize and mark an adequate pocket of fluid in the right chest. The area was then prepped and draped in the normal sterile  fashion. 1% Lidocaine was used for local anesthesia. Under ultrasound guidance a 6 Fr Safe-T-Centesis catheter was introduced. Thoracentesis was performed. The catheter was removed and a dressing applied. FINDINGS: A total of approximately 700 mL of hazy, pale amber colored fluid was removed. Samples were sent to the laboratory as requested by the clinical team. IMPRESSION: Successful ultrasound guided right thoracentesis yielding 700 mL of pleural fluid. Read by: Ascencion Dike PA-C Electronically Signed   By: Sandi Mariscal M.D.   On: 08/08/2019 13:44    Lab Data:  CBC: Recent Labs  Lab 08/25/19 0407 08/26/19 0225 08/27/19 0236 08/28/19 0428  08/29/19 0418  WBC 31.6* 36.6* 36.5* 29.3* 29.9*  HGB 13.9 13.5 12.7 14.6 14.7  HCT 44.7 42.6 41.4 45.4 45.5  MCV 94.5 94.7 94.3 93.4 94.4  PLT 261 228 197 146* PLATELET CLUMPS NOTED ON SMEAR, UNABLE TO ESTIMATE   Basic Metabolic Panel: Recent Labs  Lab 08/24/19 1744 08/25/19 0407 08/26/19 0225 08/27/19 0236 08/29/19 0418  NA 135 138 139 138 138  K 3.8 3.7 3.7 3.8 4.0  CL 99 100 101 98 98  CO2 _0 GLUCOSE 139* 120* 105* 106* 116*  BUN _1 CREATININE 0.75 0.68 0.53 0.55 0.72  CALCIUM 8.2* 8.1* 8.4* 8.3* 9.1   GFR: Estimated Creatinine Clearance: 66.4 mL/min (by C-G formula based on SCr of 0.72 mg/dL). Liver Function Tests: No results for input(s): AST, ALT, ALKPHOS, BILITOT, PROT, ALBUMIN in the last 168 hours. No results for input(s): LIPASE, AMYLASE in the last 168 hours. No results for input(s): AMMONIA in the last 168 hours. Coagulation Profile: No results for input(s): INR, PROTIME in the last 168 hours. Cardiac Enzymes: No results for input(s): CKTOTAL, CKMB, CKMBINDEX, TROPONINI in the last 168 hours. BNP (last 3 results) No results for input(s): PROBNP in the last 8760 hours. HbA1C: No results for input(s): HGBA1C in the last 72 hours. CBG: Recent Labs  Lab 08/28/19 2000 08/29/19 0000 08/29/19 0456 08/29/19 0808 08/29/19 1152  GLUCAP 110* 100* 116* 119* 113*   Lipid Profile: No results for input(s): CHOL, HDL, LDLCALC, TRIG, CHOLHDL, LDLDIRECT in the last 72 hours. Thyroid Function Tests: No results for input(s): TSH, T4TOTAL, FREET4, T3FREE, THYROIDAB in the last 72 hours. Anemia Panel: No results for input(s): VITAMINB12, FOLATE, FERRITIN, TIBC, IRON, RETICCTPCT in the last 72 hours. Urine analysis:    Component Value Date/Time   COLORURINE YELLOW 08/07/2019 1557   APPEARANCEUR HAZY (A) 08/07/2019 1557   LABSPEC 1.016 08/07/2019 1557   PHURINE 6.0 08/07/2019 1557   GLUCOSEU NEGATIVE 08/07/2019 1557   HGBUR SMALL (A)  08/07/2019 1557   BILIRUBINUR NEGATIVE 08/07/2019 1557   KETONESUR 5 (A) 08/07/2019 1557   PROTEINUR NEGATIVE 08/07/2019 1557   NITRITE NEGATIVE 08/07/2019 1557   LEUKOCYTESUR NEGATIVE 08/07/2019 1557     Sanae Willetts M.D. [facility] 08/29/2019, 12:19 PM  Pager: [phone] Between 7am to 7pm - call Pager - [phone]  After 7pm go to [email or web address] - password [email or web address]  Call night coverage person covering after 7pm

## 2019-08-30 LAB — BASIC METABOLIC PANEL
Anion gap: 16 — ABNORMAL HIGH (ref 5–15)
BUN: 18 mg/dL (ref 8–23)
CO2: 24 mmol/L (ref 22–32)
Calcium: 9.4 mg/dL (ref 8.9–10.3)
Chloride: 95 mmol/L — ABNORMAL LOW (ref 98–111)
Creatinine, Ser: 0.59 mg/dL (ref 0.44–1.00)
GFR calc Af Amer: >60 mL/min (ref >60)
GFR calc non Af Amer: >60 mL/min (ref >60)
Glucose, Bld: 102 mg/dL — ABNORMAL HIGH (ref 70–99)
Potassium: 3.9 mmol/L (ref 3.5–5.1)
Sodium: 135 mmol/L (ref 135–145)

## 2019-08-30 LAB — GLUCOSE, CAPILLARY
Glucose-Capillary: 101 mg/dL — ABNORMAL HIGH (ref 70–99)
Glucose-Capillary: 114 mg/dL — ABNORMAL HIGH (ref 70–99)
Glucose-Capillary: 121 mg/dL — ABNORMAL HIGH (ref 70–99)
Glucose-Capillary: 95 mg/dL (ref 70–99)
Glucose-Capillary: 96 mg/dL (ref 70–99)
Glucose-Capillary: 97 mg/dL (ref 70–99)

## 2019-08-30 MED ORDER — FLUCONAZOLE 100 MG PO TABS
200.0000 mg | ORAL_TABLET | Freq: Every day | ORAL | Status: DC
  Administered 2019-08-30: 200 mg via ORAL
  Filled 2019-08-30: qty 2

## 2019-08-30 MED ORDER — MORPHINE SULFATE 10 MG/5ML PO SOLN: 5.0000 mg | ORAL | Status: DC | PRN

## 2019-08-30 NOTE — TOC Transition Note (Signed)
Transition of Care [facility]) - CM/SW Discharge Note   Patient Details  Name: Jordan Clay MRN: [id number] Date of Birth: 06/04/1951  [facility] CM/SW Contact:  Dessa Phi, RN Phone Number: 08/30/2019, 9:57 AM   Clinical Narrative:  D/c plan home w/home hospice services-[facility] home hospice rep Eric-he will arrange all dme-02 8l HFNC,neb machine,w/c,bedside table,3n1,rw. The home 02 must be in the home prior to the patient d/c to home-the rep w/[facility] will coordinate that with the family-family will call the hospital once 02 is in the home.Son is the contact for d/c plans-Yitzhak [phone].      Final next level of care: Home w Hospice Care Barriers to Discharge: No Barriers Identified   Patient Goals and CMS Choice Patient states their goals for this hospitalization and ongoing recovery are:: To get home   Choice offered to / list presented to : Patient, Adult Children, Spouse  Discharge Placement                       Discharge Plan and Services   Discharge Planning Services: CM Consult Post Acute Care Choice: Durable Medical Equipment, Hospice(home hospice-[facility];private duty care-[place])                    HH Arranged: RN [place] Agency: Other - See comment([facility]-Home Hospice;dme-w/c,rw,3n1,bedside table) Date HH Agency Contacted: 08/29/19 Time [place]: [place] Representative spoke with at [place]: Eric[facility] home hosp[facility]  Social Determinants of Health ([place]) Interventions     Readmission Risk Interventions No flowsheet data found.

## 2019-08-30 NOTE — Progress Notes (Signed)
PT Cancellation Note  Patient Details Name: Jordan Clay MRN: [id number] DOB: May 26, 1951   Cancelled Treatment:    Reason Eval/Treat Not Completed: Patient declined, no reason specified  Patient declined therapy despite education on benefits of mobilizing. She also declined to participate in supine and seated exercises. Will attempt at later date/time as schedule permits.  Kipp Brood, PT, DPT Physical Therapist with [facility]  08/30/2019 1:21 PM

## 2019-08-30 NOTE — Progress Notes (Signed)
PMT no charge note  Chart reviewed today, discussed with PCCM colleague Pete Babcock, NP. I met with and discussed with patient and husband on 08/29/2019.  We discussed about the patient's current condition, her current symptom burden and symptom management medications. Agree with d/c Tussionex, agree with PO scheduled and PRN PO opioids. Morphine solution would be most effective, in my opinion. Continue to recommend home with hospice on discharge.   No additional PMT specific recommendations at this time.   Zeba Anwar MD [place] palliative [phone].  

## 2019-08-30 NOTE — Progress Notes (Signed)
  Speech Language Pathology Treatment: Dysphagia  Patient Details Name: Jordan Clay MRN: [id number] DOB: April 14, 1951 Today's Date: 08/30/2019 Time: 6431-4276 SLP Time Calculation (min) (ACUTE ONLY): 9 min  Assessment / Plan / Recommendation Clinical Impression    HPI   SLP educated pt and family to compensation strategies for esophagitis orally and provided in writing.  Further advised to alternatives or taking medications, etc.  Son and pt's spouse present during brief session.  Upon entrance to room, son stated pt verbalized she felt the liquid wouldn't "go down" - SLP inquired if bolus was in esophagus or throat - she stated it was "lower" - pointing toward stomach.  SLP advised she take small amount of additional liquids in attempts to clear.  Intake has been poor - diet advanced to regular now - and pt appears to be managing based on her vitals.  She reports improvement in odynophagia.  No severe dyspnea observed today with minimal intake but pt is "tired" and stated she could not open her eyes.  She was feeding herself however and SLP encouraged pt to continue to feed herself to improve neurological input/readiness for efficiency and safety.  Also reviewed liquids that may be better tolerated with esophagitis and foods/drinks to avoid. Advised liquids may be more comforting for her and smoothies may provide some nutrition with maximal comfort.  All education completed using teach back. - SLP to sign off.       SLP Plan  All goals met       Recommendations  Diet recommendations: Regular;Thin liquid Liquids provided via: Cup;Straw Medication Administration: (as tolerated) Supervision: Patient able to self feed Compensations: Small sips/bites;Slow rate Postural Changes and/or Swallow Maneuvers: Seated upright 90 degrees;Upright 30-60 min after meal                Oral Care Recommendations: Oral care BID;Patient independent with oral care Follow up Recommendations: Other  (comment)(TBD) SLP Visit Diagnosis: Dysphagia, unspecified (R13.10) Plan: All goals met       GO                Macario Golds 08/30/2019, 12:43 PM   Luanna Salk, [facility] SLP Acute Rehab Services Pager [phone] Office [phone]

## 2019-08-30 NOTE — Progress Notes (Signed)
PROGRESS NOTE  Jordan Clay  DOB: 11-Feb-1951  PCP: Hulan Fess, MD VZD:[id number]  DOA: 08/07/2019  LOS: 23 days    Chief Complaint  Patient presents with   COPD   low sats    Brief narrative: Jordan Clay is a 68 y.o. female with PMH of 40 pack year smoking history,COPD, HLD and recently completed a course of antibiotics for pneumonia a week prior to this presentation.   She presented to the ED on 10/19 with reports of 2 weeks of cough & dyspnea.  CTA chest revealed obstruction of the central RLL bronchus with associated postobstructive PNA &pleural effusion. -10/20, underwent thoracentesis by IR with pleural fluid consistent with metastatic adenocarcinoma.  PCCM was consulted and patient underwent EBUS on 10/21 with pathology consistent with non-small cell carcinoma.  Oncology, Dr Alen Blew, was consulted - not felt to be a candidate for systemic chemotherapy currently. Subsequently rad-onc was involved with plan for 2 week XRT starting 08/14/19. 10/24, patient went to hypoxic respiratory failure.  Right-sided chest tube was placed which was subsequently converted to Pleurx catheter.   10/26, patient started on radiation treatment.  11/5, transfer to hospitalist service.  Requiring right sided chest tube placement--subsequently converted to pleurx catheter. Patient was requiring 20 lits [place] flow 02 until last week but improving now and down to 3lits [place] on 11/3. Patient received abx: Rocephin/Azithro 10/19 >>10/24 and Unasyn 10/26 >>10/31. COVID -ve, blood cx and pleural fluid/BAL cx -ve so far. Patient completed 7/14 radiation treatments. Transfer to hospitalist service on 11/5.  Subjective: Patient was seen and examined this morning.  Elderly Caucasian female.  Thin built.  On mild respiratory distress.  Sad affect.  Did not mention any complaint to me.  Assessment/Plan: Stage IV metastatic lung cancer -Newly diagnosed on this admission. -Consult services appreciated  from pulmonology, IR, oncology, radiation oncology. -Not a candidate for chemotherapy.  Tried on radiation treatment. -Overall poor prognosis.   -Ultimately palliative care consultation was obtained.  Poor performance status, poor prognosis. -Patient and family chose hospice care for discharge.  Acute respiratory failure with hypoxia secondary to lung CA/obstructed airway/postobstructive pneumonia -Respiratory failure secondary to cancer, pneumonia, malignant pleural effusion. -Postobstructive pneumonia treated with 2 weeks course of antibiotics.   -Pleurx catheter in place for recurrent malignant effusion.  Advised to drain every other day, -Continue oxygen's supplementation by nasal cannula.  Acute on chronic hypotension -Has baseline low BP, Avoid too frequent or large volume Pleurx catheter drainage -Hypotension limiting physical therapy and Pleurx drainage, started low-dose midodrine 5 mg twice a day however BP still remains soft  Urinary retention Patient was noted to have urinary retention on 11/5, continue Foley  Leukocytosis In the setting of steroids, not spiking any fevers, WBC count improving  Malnutrition of moderate degree. -BMI 25.3. -SLP evaluation done, reviewed recommendations.  Continue full liquids, will add nutritional supplements  Code Status: DNR DVT Prophylaxis:  I will stop Lovenox at this time. Family Communication: Updated by previous hospitalist yesterday.  No family at bedside today. Expected Discharge:  Palliative care team to adjust her pain medicines today.  Anticipate discharge to home tomorrow with home hospice set up and DME.  Antimicrobials: Anti-infectives (From admission, onward)   Start     Dose/Rate Route Frequency Ordered Stop   08/30/19 1200  fluconazole (DIFLUCAN) tablet 200 mg     200 mg Oral Daily 08/30/19 1044 09/23/19 0959   08/14/19 1100  Ampicillin-Sulbactam (UNASYN) 3 g in sodium chloride 0.9 % 100  mL IVPB     3 g 200 mL/hr  over 30 Minutes Intravenous Every 6 hours 08/14/19 1026 08/19/19 0556   08/10/19 1200  cefTRIAXone (ROCEPHIN) 2 g in sodium chloride 0.9 % 100 mL IVPB     2 g 200 mL/hr over 30 Minutes Intravenous Every 24 hours 08/10/19 1043 08/13/19 1355   08/08/19 1600  cefTRIAXone (ROCEPHIN) 2 g in sodium chloride 0.9 % 100 mL IVPB  Status:  Discontinued     2 g 200 mL/hr over 30 Minutes Intravenous Every 24 hours 08/07/19 2043 08/10/19 0723   08/08/19 1600  azithromycin (ZITHROMAX) 500 mg in sodium chloride 0.9 % 250 mL IVPB     500 mg 250 mL/hr over 60 Minutes Intravenous Every 24 hours 08/07/19 2043 08/12/19 1844   08/07/19 1600  cefTRIAXone (ROCEPHIN) 1 g in sodium chloride 0.9 % 100 mL IVPB     1 g 200 mL/hr over 30 Minutes Intravenous  Once 08/07/19 1556 08/07/19 1910   08/07/19 1600  azithromycin (ZITHROMAX) 500 mg in sodium chloride 0.9 % 250 mL IVPB     500 mg 250 mL/hr over 60 Minutes Intravenous  Once 08/07/19 1556 08/07/19 2012      Diet Order            Diet regular Room service appropriate? Yes; Fluid consistency: Thin  Diet effective now              Infusions:    Scheduled Meds:  benzonatate  200 mg Oral TID   Chlorhexidine Gluconate Cloth  6 each Topical Daily   enoxaparin (LOVENOX) injection  40 mg Subcutaneous Q24H   feeding supplement  1 Container Oral TID WC   feeding supplement (ENSURE ENLIVE)  237 mL Oral BID BM   fluconazole  200 mg Oral Daily   gabapentin  100 mg Oral TID   hydrocortisone cream   Topical BID   insulin aspart  0-9 Units Subcutaneous Q4H   ipratropium-albuterol  3 mL Nebulization TID   mouth rinse  15 mL Mouth Rinse BID   midodrine  5 mg Oral BID WC   pantoprazole  40 mg Oral BID AC   polyethylene glycol  17 g Oral BID   predniSONE  30 mg Oral Q breakfast   ramelteon  8 mg Oral QHS   sodium chloride flush  3 mL Intravenous Q12H    PRN meds: acetaminophen **OR** acetaminophen, albuterol, bisacodyl, bisacodyl, hydrOXYzine,  morphine, morphine injection, ondansetron (ZOFRAN) IV, oxyCODONE, senna-docusate, temazepam   Objective: Vitals:   08/30/19 0457 08/30/19 0847  BP: 92/63   Pulse: (!) 119   Resp: (!) 21   Temp: 98.3 F (36.8 C)   SpO2: 92% 91%    Intake/Output Summary (Last 24 hours) at 08/30/2019 1220 Last data filed at 08/30/2019 0500 Gross per 24 hour  Intake 3 ml  Output 500 ml  Net -497 ml   Filed Weights   08/07/19 1504 08/21/19 2300  Weight: 70.3 kg 73.4 kg   Weight change:  Body mass index is 25.34 kg/m.   Physical Exam: General exam: Appears calm and comfortable.  Thin built Caucasian female.  On mild respiratory distress on oxygen supplementation.   Skin: No rashes, lesions or ulcers. HEENT: Atraumatic, normocephalic, supple neck, no obvious bleeding Lungs: Diminished air entry in bases.  Right Pleurx catheter in place. CVS: Tachycardic, no murmur GI/Abd soft, nondistended, nontender, bowel sound present CNS: Sleepy, opens eyes on verbal command, slow to respond, oriented to place  and person Psychiatry: Sad affect Extremities: No edema no calf tenderness  Data Review: I have personally reviewed the laboratory data and studies available.  Recent Labs  Lab 08/25/19 0407 08/26/19 0225 08/27/19 0236 08/28/19 0428 08/29/19 0418  WBC 31.6* 36.6* 36.5* 29.3* 29.9*  HGB 13.9 13.5 12.7 14.6 14.7  HCT 44.7 42.6 41.4 45.4 45.5  MCV 94.5 94.7 94.3 93.4 94.4  PLT 261 228 197 146* PLATELET CLUMPS NOTED ON SMEAR, UNABLE TO ESTIMATE   Recent Labs  Lab 08/25/19 0407 08/26/19 0225 08/27/19 0236 08/29/19 0418 08/30/19 0500  NA 138 139 138 138 135  K 3.7 3.7 3.8 4.0 3.9  CL 100 101 98 98 95*  CO2 _0 GLUCOSE 120* 105* 106* 116* 102*  BUN _1 CREATININE 0.68 0.53 0.55 0.72 0.59  CALCIUM 8.1* 8.4* 8.3* 9.1 9.4    Terrilee Croak, MD  [facility] 08/30/2019

## 2019-08-30 NOTE — Progress Notes (Signed)
NAME:  Jordan ALONGE, MRN:  [id number], DOB:  Feb 11, 1951, LOS: 33 ADMISSION DATE:  08/07/2019, CONSULTATION DATE:  10/20 REFERRING MD:  Reesa Chew (THR) , CHIEF COMPLAINT:  Cough, SOB  Brief History   68 yo female, smoker (40 pack years, quit 1 week PTA when she got pneumonia), with COPD, who presented to [facility] on 10/19 with reports of 2 weeks of cough & SOB.  She was treated for PNA by her PCP.  Work up included a CTA chest which showed obstruction of the central RLL bronchus with associated postobstructive PNA & pleural effusion.  She underwent thoracentesis on 10/20 with pleural fluid consistent with metastatic adenocarcinoma. FOB with EBUS on 10/21 with pathology consistent with non-small cell carcinoma.   Past Medical History   has a past medical history of COPD (chronic obstructive pulmonary disease) ([place]), High cholesterol, and Pneumonia.   Significant Hospital Events   10/19 Admit with post-obstructive PNA 10/20 PCCM consulted 10/20 IR thora  - metastatic adenoca 10/21 FOB with EBUS - NSCLC + 10/21 - Onc consult - Dr Alen Blew -  10/23 - Rad onc consult - ECOG 2. 2 week XRT recommendined startbng 08/14/2019 10/24 Tx to SDU for hypoxic resp failure, cough, SOB 10/25  R chest tube IN 10/27 - palliative consult 10/29 20L flow / 50% FiO2, 1100 ml out of chest tube 1030 20L flow / 45% FiO2  10/31 - R chest tube OUT ->  PleurX  11/2 - Oncology plan ->   If she has actionable mutation that can be used to to target her cancer, disease might be able to be palliated with systemic oral therapy.  Overall she is not candidate for systemic chemotherapy at this time. 11/3 oxygen requirement continues to improve, 3 L today - Still having issues with fatigue Cough and is better Oxygen requirement improving 11/6: Increased pleural effusion.  Drained 500 mL, similar to last volume removed 11/7: Right chest tube drained 400 ml  11/9: drained 375 ml.  11/10 still on high oxygen   Consults:  PCCM 10/20     Procedures:  Right Chest Tube 10/25 >> 10/31 PleurX 10/31 11/2 Pleurx drain for 900 cc of fluid Significant Diagnostic Tests:  CTA chest 10/19 >> No evidence of pulmonary embolism.  Obstruction of central right lower lobe bronchus and central low-attenuation, suspicious for centrally obstructing mass.  Near complete right lower lobe consolidation, suspicious for postobstructive pneumonitis. Moderate right pleural effusion. New bulky mediastinal and right hilar lymphadenopathy, consistent with metastatic disease.  Stable sub-cm bilateral upper lobe and right middle lobe pulmonary nodules. IR Thoracentesis 10/20 >> 700 cc  No improvement is sob or cough/ cyt pos adenocarcinoma of the lung Bronchoscopy 10/21 >> 90% obst distal BI/ with endobronchial ultrasound   Cytology R Pleural Fluid 10/20 >> metastatic adenocarcinoma  Cytology 7, FNA 10/21 >> non-small cell carcinoma  Cytology 11 R FNA 10/21 >> non-small cell carcinoma  Cytology RLL brushing 10/21 >> non-small cell, TTF-1 positive   Micro Data:  BC x 2 10/19 >> neg  COVID 10/19 >> neg  Pleural fluid  10/20 >> neg  BAL 10/21 >> neg  Antimicrobials:  Rocephin 10/19 >> 10/24 Azithro 10/19 >> 10/24 Unasyn 10/26 >> 10/31  Interim history/subjective:  Looks about the same still very anxious Objective   Blood pressure 92/63, pulse (Abnormal) 119, temperature 98.3 F (36.8 C), temperature source Oral, resp. rate (Abnormal) 21, height _0  (1.702 m), weight 73.4 kg, SpO2 91 %.  Intake/Output Summary (Last 24 hours) at 08/30/2019 0957 Last data filed at 08/30/2019 0500 Gross per 24 hour  Intake 3 ml  Output 500 ml  Net -497 ml   Filed Weights   08/07/19 1504 08/21/19 2300  Weight: 70.3 kg 73.4 kg      Resolved Hospital Problem list    General 68 year old female remains anxious and emotional at times HEENT: temporal wasting mucous membranes moist  Pulmonary: Clear to auscultation diminished on the right Cardiac:  Tachycardic regular rhythm Abdomen nontender Extremities warm and dry Neuro intact   Assessment & Plan:   Acute hypoxemic respiratory failure 2/2 -Metastatic adenocarcinoma of the lung  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  Trapped lung Likely underlying obstructive lung disease Cough Odynophagia  Insomnia Deconditioning  Interval Only drained 50 ml from pleur-x yesterday. I am more convinced that the driving force behind her symptom burden is from the significant right sided post-obstructive atx. I also think that there is a heavy psychosomatic component which gets greatly exacerbated by rapid escalation of anxiety   Appreciate oncology input yesterday.  We concur she would be hospice appropriate at this point, very doubtful on her current trajectory that she would be chemotherapy candidate given her degree of deconditioning and poor performance status.  Her son was at bedside today, the patient and family are open to more aggressive focus on symptom/palliative management  Plan Continue supplemental oxygen, would titrate between 8 and 10 L  Continue cough suppression, sounds like we need to discontinue the Tussionex as she apparently gets confused with this  We will continue Tessalon Perles  Continue PPI  Slow tapering of steroids, over the next 7 days  I think we can change the Pleurx drainage back to every other day  Continue nystatin swish and swallow to empirically cover for possible esophagitis  I will reach out to palliative care and review current analgesia/cough regimen.  I think we need to establish what will be our maintenance and rescue medications for home and have this "test driven" here in the hospital prior to discharge  Erick Colace ACNP-BC [employer] Pager # [phone] OR # [phone] if no answer

## 2019-08-31 LAB — GLUCOSE, CAPILLARY
Glucose-Capillary: 116 mg/dL — ABNORMAL HIGH (ref 70–99)
Glucose-Capillary: 90 mg/dL (ref 70–99)
Glucose-Capillary: 95 mg/dL (ref 70–99)

## 2019-08-31 LAB — BASIC METABOLIC PANEL
Anion gap: 17 — ABNORMAL HIGH (ref 5–15)
BUN: 16 mg/dL (ref 8–23)
CO2: 23 mmol/L (ref 22–32)
Calcium: 9.3 mg/dL (ref 8.9–10.3)
Chloride: 90 mmol/L — ABNORMAL LOW (ref 98–111)
Creatinine, Ser: 0.57 mg/dL (ref 0.44–1.00)
GFR calc Af Amer: >60 mL/min (ref >60)
GFR calc non Af Amer: >60 mL/min (ref >60)
Glucose, Bld: 99 mg/dL (ref 70–99)
Potassium: 4 mmol/L (ref 3.5–5.1)
Sodium: 130 mmol/L — ABNORMAL LOW (ref 135–145)

## 2019-08-31 MED ORDER — ALPRAZOLAM 1 MG PO TABS: 1.0000 mg | ORAL_TABLET | Freq: Three times a day (TID) | ORAL | 0 refills | Status: DC | PRN

## 2019-08-31 MED ORDER — MORPHINE SULFATE 10 MG/5ML PO SOLN: 5.0000 mg | ORAL | 0 refills | Status: AC | PRN

## 2019-08-31 MED ORDER — RAMELTEON 8 MG PO TABS: 8.0000 mg | ORAL_TABLET | Freq: Every day | ORAL | 0 refills | Status: AC

## 2019-08-31 MED ORDER — SENNOSIDES-DOCUSATE SODIUM 8.6-50 MG PO TABS: 2.0000 | ORAL_TABLET | Freq: Every evening | ORAL | 0 refills | Status: AC | PRN

## 2019-08-31 MED ORDER — ALPRAZOLAM 1 MG PO TABS: 1.0000 mg | ORAL_TABLET | Freq: Three times a day (TID) | ORAL | 0 refills | Status: AC | PRN

## 2019-08-31 MED ORDER — ENSURE ENLIVE PO LIQD: 237.0000 mL | Freq: Two times a day (BID) | ORAL | 12 refills | Status: AC

## 2019-08-31 MED ORDER — BISACODYL 5 MG PO TBEC: 10.0000 mg | DELAYED_RELEASE_TABLET | Freq: Every day | ORAL | 0 refills | Status: AC | PRN

## 2019-08-31 MED ORDER — GABAPENTIN 100 MG PO CAPS: 100.0000 mg | ORAL_CAPSULE | Freq: Three times a day (TID) | ORAL | 0 refills | Status: AC

## 2019-08-31 MED ORDER — GABAPENTIN 100 MG PO CAPS: 100.0000 mg | ORAL_CAPSULE | Freq: Three times a day (TID) | ORAL | 0 refills | Status: DC

## 2019-08-31 MED ORDER — BISACODYL 5 MG PO TBEC: 10.0000 mg | DELAYED_RELEASE_TABLET | Freq: Every day | ORAL | 0 refills | Status: DC | PRN

## 2019-08-31 MED ORDER — FENTANYL 25 MCG/HR TD PT72: 1.0000 | MEDICATED_PATCH | TRANSDERMAL | 0 refills | Status: AC

## 2019-08-31 MED ORDER — RAMELTEON 8 MG PO TABS: 8.0000 mg | ORAL_TABLET | Freq: Every day | ORAL | 0 refills | Status: DC

## 2019-08-31 MED ORDER — OXYCODONE HCL 5 MG PO TABS: 5.0000 mg | ORAL_TABLET | ORAL | 0 refills | Status: DC | PRN

## 2019-08-31 MED ORDER — OXYCODONE HCL 5 MG PO TABS: 5.0000 mg | ORAL_TABLET | ORAL | 0 refills | Status: AC | PRN

## 2019-08-31 MED ORDER — SENNOSIDES-DOCUSATE SODIUM 8.6-50 MG PO TABS: 2.0000 | ORAL_TABLET | Freq: Every evening | ORAL | 0 refills | Status: DC | PRN

## 2019-08-31 MED ORDER — MORPHINE SULFATE 10 MG/5ML PO SOLN: 5.0000 mg | ORAL | 0 refills | Status: DC | PRN

## 2019-08-31 NOTE — Progress Notes (Signed)
Patient discharged home with Hospice services.  IV removed - WNL.  Foley left in place for comfort measures.  AVS reviewed with sons at bedside - verbalized understanding.  Eric with [facility] called and made aware of patient leaving with PTAR.  Patient placed on NRB for transport home.

## 2019-08-31 NOTE — Discharge Summary (Signed)
Physician Discharge Summary  Jordan Clay IRJ:[id number] DOB: January 13, 1951 DOA: 08/07/2019  PCP: Hulan Fess, MD  Admit date: 08/07/2019 Discharge date: 08/31/2019  Admitted From: Home Discharge disposition: Home with hospice   Code Status: DNR  Diet Recommendation: As tolerated   Recommendations for Outpatient Follow-Up:   1. Per hospice policy  Discharge Diagnosis:   Principal Problem:   Mass of right lung Active Problems:   COPD (chronic obstructive pulmonary disease) (HCC)   Postobstructive pneumonia   Pleural effusion on right   Prolonged QT interval   Acute respiratory failure with hypoxemia (HCC)   SOB (shortness of breath)   Malnutrition of moderate degree   Palliative care by specialist   Cough   Goals of care, counseling/discussion   History of Present Illness / Brief narrative:  Jordan Clay is a 68 y.o. female with PMH of 40 pack year smoking history,COPD, HLD and recently completed a course of antibiotics for pneumonia a week prior to this presentation.   She presented to the ED on 10/19 with reports of 2 weeks of cough & dyspnea.  CTA chest revealed obstruction of the central RLL bronchus with associated postobstructive PNA &pleural effusion. -10/20, underwent thoracentesis by IR with pleural fluid consistent with metastatic adenocarcinoma.  PCCM was consulted and patient underwent EBUS on 10/21 with pathology consistent with non-small cell carcinoma.  Oncology, Dr Alen Blew, was consulted - not felt to be a candidate for systemic chemotherapy currently. Subsequently rad-onc was involved with plan for 2 week XRT starting 08/14/19. 10/24, patient went to hypoxic respiratory failure.  Right-sided chest tube was placed which was subsequently converted to Pleurx catheter.   10/26, patient started on radiation treatment.  11/5, transfer to hospitalist service.  Hospital Course:  Stage IV metastatic lung cancer -Newly diagnosed on this admission.  -Consult services appreciated from pulmonology, IR, oncology, radiation oncology. -Not a candidate for chemotherapy.  Tried on radiation treatment and ended up having more side effects than benefits. -Overall poor prognosis.   -Ultimately palliative care consultation was obtained.  Poor performance status, poor prognosis. -Patient and family chose hospice care at home for discharge. -I had a long conversation with patient's husband and 2 sons this morning.  Since all this happened in a very quick a span of 3 weeks, it is too hard for family to tolerate and come up with appropriate decision of hospice care  Acute respiratory failure with hypoxia secondary to lung CA/obstructed airway/postobstructive pneumonia -Respiratory failure secondary to cancer, pneumonia, malignant pleural effusion. -Postobstructive pneumonia treated with 2 weeks course of antibiotics.   -Pleurx catheter in place for recurrent malignant effusion.  Advised to drain every other day, -Continue oxygen's supplementation by nasal cannula.  Acute on chronic hypotension -Has baseline low BP,Avoid too frequent or large volume Pleurx catheter drainage -Hypotension limiting physical therapy and Pleurx drainage, started low-dose midodrine 5 mg twice a day however BP still remains soft.  Will not continue midodrine at home as we focus on comfort care.  Urinary retention Patient was noted to have urinary retention on 11/5, continue Foley  Leukocytosis In the setting of steroids, not spiking any fevers,WBC count improving. We will not continue any steroids at discharge.  Malnutrition of moderate degree. -BMI 25.3. -SLP evaluation done, reviewed recommendations. Continue full liquids, and nutritional supplements.  Okay to discharge to home today with home hospice.  Subjective:  Seen and examined this morning.  Thin built elderly Caucasian female.  Seem to be in significant air hunger.  Spoke with family at bedside.   Discharge Exam:   Vitals:   08/30/19 2029 08/31/19 0405 08/31/19 0748 08/31/19 0836  BP: (!) 91/59 95/64  95/68  Pulse: (!) 118 (!) 120  (!) 130  Resp: (!) 21 (!) 21  20  Temp: 98.3 F (36.8 C) 98.3 F (36.8 C)  98.1 F (36.7 C)  TempSrc: Oral Oral  Oral  SpO2: 90% 93% 91% 91%  Weight:      Height:        Body mass index is 25.34 kg/m.  General exam: Seems to be actively dying Skin: No rashes, lesions or ulcers. HEENT: Atraumatic, normocephalic, supple neck, no obvious bleeding Lungs: Diminished air entry in both bases CVS: Tachycardic, no murmur GI/Abd soft, nontender, nondistended, bowel sound present CNS: Opens eyes on verbal command.  Able to have some conversation Psychiatry: Depressed look, Extremities: No pedal edema, no calf tenderness  Discharge Instructions:  Wound care: None Discharge Instructions    Increase activity slowly   Complete by: As directed      Follow-up Information    Parrett, Tammy S, NP Follow up on 09/04/2019.   Specialty: Pulmonary Disease Why: 10:00 am  Contact information: [address] [place] [address] [phone]        [facility] Follow up.   Why: Home visit-they will call to arrange.They will manage all equipment. Contact information: [place] [address] [phone]          Allergies as of 08/31/2019      Reactions   Ciprocin-fluocin-procin [fluocinolone]    Minocycline Cough   Sulfa Antibiotics Cough   Levofloxacin Nausea And Vomiting, Anxiety      Medication List    STOP taking these medications   atorvastatin 20 MG tablet Commonly known as: LIPITOR   benzonatate 200 MG capsule Commonly known as: TESSALON   cefdinir 300 MG capsule Commonly known as: OMNICEF     TAKE these medications   albuterol 108 (90 Base) MCG/ACT inhaler Commonly known as: VENTOLIN HFA Inhale 2 puffs into the lungs every 4 (four) hours as needed for shortness of breath.    ALPRAZolam 1 MG tablet Commonly known as: Xanax Take 1 tablet (1 mg total) by mouth 3 (three) times daily as needed for up to 7 days for anxiety or sleep.   bisacodyl 5 MG EC tablet Commonly known as: DULCOLAX Take 2 tablets (10 mg total) by mouth daily as needed for moderate constipation.   feeding supplement (ENSURE ENLIVE) Liqd Take 237 mLs by mouth 2 (two) times daily between meals.   gabapentin 100 MG capsule Commonly known as: NEURONTIN Take 1 capsule (100 mg total) by mouth 3 (three) times daily.   morphine 10 MG/5ML solution Take 2.5 mLs (5 mg total) by mouth every 2 (two) hours as needed for up to 5 days for severe pain (as needed for cough or pain).   oxyCODONE 5 MG immediate release tablet Commonly known as: Oxy IR/ROXICODONE Take 1 tablet (5 mg total) by mouth every 4 (four) hours as needed for up to 5 days for severe pain or breakthrough pain.   ramelteon 8 MG tablet Commonly known as: ROZEREM Take 1 tablet (8 mg total) by mouth at bedtime.   senna-docusate 8.6-50 MG tablet Commonly known as: Senokot-S Take 2 tablets by mouth at bedtime as needed for mild constipation or moderate constipation.       Time coordinating discharge: 35 minutes  The results of significant diagnostics from this hospitalization (including imaging, microbiology, ancillary and laboratory) are listed below for reference.    Procedures and Diagnostic Studies:   Dg Chest Port 1 View  Result Date: 08/25/2019 CLINICAL DATA:  Increased shortness of breath. EXAM: PORTABLE CHEST 1 VIEW COMPARISON:  Chest x-ray 08/21/2019. FINDINGS: Right pleural catheter again noted with tip over the lateral aspect of the right upper chest. Unchanged large right pleural effusion. Unchanged right lung infiltrate. Persistent left base atelectasis. No pneumothorax. Stable cardiomegaly. No acute bony abnormality. IMPRESSION: 1. Right pleural catheter again noted with tip over the lateral aspect of the right upper  chest. Unchanged large right pleural effusion. No pneumothorax. 2. Unchanged right lung infiltrate. Persistent left base atelectasis. 3.  Stable cardiomegaly. Electronically Signed   By: Marcello Moores  Register   On: 08/25/2019 07:37     Labs:   Basic Metabolic Panel: Recent Labs  Lab 08/26/19 0225 08/27/19 0236 08/29/19 0418 08/30/19 0500 08/31/19 0410  NA 139 138 138 135 130*  K 3.7 3.8 4.0 3.9 4.0  CL 101 98 98 95* 90*  CO2 26 25 22 24 23   GLUCOSE 105* 106* 116* 102* 99  BUN 16 17 17 18 16   CREATININE 0.53 0.55 0.72 0.59 0.57  CALCIUM 8.4* 8.3* 9.1 9.4 9.3   GFR Estimated Creatinine Clearance: 66.4 mL/min (by C-G formula based on SCr of 0.57 mg/dL). Liver Function Tests: No results for input(s): AST, ALT, ALKPHOS, BILITOT, PROT, ALBUMIN in the last 168 hours. No results for input(s): LIPASE, AMYLASE in the last 168 hours. No results for input(s): AMMONIA in the last 168 hours. Coagulation profile No results for input(s): INR, PROTIME in the last 168 hours.  CBC: Recent Labs  Lab 08/25/19 0407 08/26/19 0225 08/27/19 0236 08/28/19 0428 08/29/19 0418  WBC 31.6* 36.6* 36.5* 29.3* 29.9*  HGB 13.9 13.5 12.7 14.6 14.7  HCT 44.7 42.6 41.4 45.4 45.5  MCV 94.5 94.7 94.3 93.4 94.4  PLT 261 228 197 146* PLATELET CLUMPS NOTED ON SMEAR, UNABLE TO ESTIMATE   Cardiac Enzymes: No results for input(s): CKTOTAL, CKMB, CKMBINDEX, TROPONINI in the last 168 hours. BNP: Invalid input(s): POCBNP CBG: Recent Labs  Lab 08/30/19 1636 08/30/19 2030 08/31/19 0011 08/31/19 0406 08/31/19 0753  GLUCAP 97 96 90 95 116*   D-Dimer No results for input(s): DDIMER in the last 72 hours. Hgb A1c No results for input(s): HGBA1C in the last 72 hours. Lipid Profile No results for input(s): CHOL, HDL, LDLCALC, TRIG, CHOLHDL, LDLDIRECT in the last 72 hours. Thyroid function studies No results for input(s): TSH, T4TOTAL, T3FREE, THYROIDAB in the last 72 hours.  Invalid input(s): FREET3 Anemia  work up No results for input(s): VITAMINB12, FOLATE, FERRITIN, TIBC, IRON, RETICCTPCT in the last 72 hours. Microbiology No results found for this or any previous visit (from the past 240 hour(s)).  Please note: You were cared for by a hospitalist during your hospital stay. Once you are discharged, your primary care physician will handle any further medical issues. Please note that NO REFILLS for any discharge medications will be authorized once you are discharged, as it is imperative that you return to your primary care physician (or establish a relationship with a primary care physician if you do not have one) for your post hospital discharge needs so that they can reassess your need for medications and monitor your lab values.  Signed: Terrilee Croak  [facility] 08/31/2019, 11:22 AM

## 2019-08-31 NOTE — Discharge Instructions (Signed)
Hospice °Hospice is a service that is designed to provide people who are terminally ill and their families with medical, spiritual, and psychological support. Its aim is to improve your quality of life by keeping you as comfortable as possible in the final stages of life. °Who will be my providers when I begin hospice care? °Hospice teams often include: °· A nurse. °· A doctor. The hospice doctor will be available for your care, but you can include your regular doctor or nurse practitioner. °· A social worker. °· A counselor. °· A religious leader (such as a chaplain). °· A dietitian. °· Therapists. °· Trained volunteers who can help with care. °What services does hospice provide? °Hospice services can vary depending on the center or organization. Generally, they include: °· Ways to keep you comfortable, such as: °? Providing care in your home or in a home-like setting. °? Working with your family and friends to help meet your needs. °? Allowing you to enjoy the support of loved ones by receiving much of your basic care from family and friends. °· Pain relief and symptom management. The staff will supply all necessary medicines and equipment so that you can stay comfortable and alert enough to enjoy the company of your friends and family. °· Visits or care from a nurse and doctor. This may include 24-hour on-call services. °· Companionship when you are alone. °· Allowing you and your family to rest. Hospice staff may do light housekeeping, prepare meals, and run errands. °· Counseling. They will make sure your emotional, spiritual, and social needs are being met, as well as those needs of your family members. °· Spiritual care. This will be individualized to meet your needs and your family's needs. It may involve: °? Helping you and your family understand the dying process. °? Helping you say goodbye to your family and friends. °? Performing a specific religious ceremony or ritual. °· Massage. °· Nutrition  therapy. °· Physical and occupational therapy. °· Short-term inpatient care, if something cannot be managed in the home. °· Art or music therapy. °· Bereavement support for grieving family members. °When should hospice care begin? °Most people who use hospice are believed to have less than 6 months to live. °· Your family and health care providers can help you decide when hospice services should begin. °· If you live longer than 6 months but your condition does not improve, your doctor may be able to approve you for continued hospice care. °· If your condition improves, you may discontinue the program. °What should I consider before selecting a program? °Most hospice programs are run by nonprofit, independent organizations. Some are affiliated with hospitals, nursing homes, or home health care agencies. Hospice programs can take place in your home or at a hospice center, hospital, or skilled nursing facility. When choosing a hospice program, ask the following questions: °· What services are available to me? °· What services will be offered to my loved ones? °· How involved will my loved ones be? °· How involved will my health care provider be? °· Who makes up the hospice care team? How are they trained or screened? °· How will my pain and symptoms be managed? °· If my circumstances change, can the services be provided in a different setting, such as my home or in the hospital? °· Is the program reviewed and licensed by the state or certified in some other way? °· What does it cost? Is it covered by insurance? °· If I choose a hospice   center or nursing home, where is the hospice center located? Is it convenient for family and friends? °· If I choose a hospice center or nursing home, can my family and friends visit any time? °· Will you provide emotional and spiritual support? °· Who can my family call with questions? °Where can I learn more about hospice? °You can learn about existing hospice programs in your area  from your health care providers. You can also read more about hospice online. The websites of the following organizations have helpful information: °· National Hospice and Palliative Care Organization (NHPCO): www.nhpco.org °· National Association for Home Care & Hospice (NAHC): www.nahc.org °· Hospice Foundation of America (HFA): www.hospicefoundation.org °· American Cancer Society (ACS): www.cancer.org °· Hospice Net: www.hospicenet.org °· Visiting Nurse Associations of America (VNAA): www.vnaa.org °You may also find more information by contacting the following agencies: °· A local agency on aging. °· Your local United Way chapter. °· Your state's department of health or social services. °Summary °· Hospice is a service that is designed to provide people who are terminally ill and their families with medical, spiritual, and psychological support. °· Hospice aims to improve your quality of life by keeping you as comfortable as possible in the final stages of life. °· Hospice teams often include a doctor, nurse, social worker, counselor, religious leader,dietitian, therapists, and volunteers. °· Hospice care generally includes medicine for symptom management, visits from doctors and nurses, physical and occupational therapy, nutrition counseling, spiritual and emotional counseling, caregiver support, and bereavement support for grieving family members. °· Hospice programs can take place in your home or at a hospice center, hospital, or skilled nursing facility. °This information is not intended to replace advice given to you by your health care provider. Make sure you discuss any questions you have with your health care provider. °Document Released: 01/22/2004 Document Revised: 09/17/2017 Document Reviewed: 10/27/2016 °Elsevier Patient Education © 2020 Elsevier Inc. ° °

## 2019-08-31 NOTE — TOC Transition Note (Signed)
Transition of Care [facility]) - CM/SW Discharge Note   Patient Details  Name: Jordan Clay MRN: [id number] Date of Birth: 06/16/1951  [facility]) CM/SW Contact:  Dessa Phi, RN Phone Number: 08/31/2019, 10:11 AM   Clinical Narrative:  Awaiting d/c summary to fax to [facility] aware & following for d/c. [facility] has already delivered all dme to patient's home. Patient will transport by [facility] non emergency-has DNR,02,f/c-confirmed address-contact person is Son [phone]. Will call [facility] once d/c summary, & nurse is ready.     Final next level of care: Home w Hospice Care Barriers to Discharge: No Barriers Identified   Patient Goals and CMS Choice Patient states their goals for this hospitalization and ongoing recovery are:: To get home   Choice offered to / list presented to : Patient, Adult Children, Spouse  Discharge Placement                Patient to be transferred to facility by: [place] Name of family member notified: Buddy Duty son [phone] Patient and family notified of of transfer: 08/31/19  Discharge Plan and Services   Discharge Planning Services: CM Consult Post Acute Care Choice: Durable Medical Equipment, Hospice(home hospice[facility];private duty care-[facility])                    HH Arranged: RN [place] Agency: Other - See comment([facility]-Home Hospice;dme-w/c,rw,3n1,bedside table) Date HH Agency Contacted: 08/29/19 Time [place]: [place] Representative spoke with at [place]: [facility]  Social Determinants of Health ([place]) Interventions     Readmission Risk Interventions No flowsheet data found.

## 2019-08-31 NOTE — TOC Transition Note (Signed)
Transition of Care [facility]) - CM/SW Discharge Note   Patient Details  Name: Jordan Clay MRN: [id number] Date of Birth: [birth year]  Transition of Care [facility]) CM/SW Contact:  Dessa Phi, RN Phone Number: 08/31/2019, 11:34 AM   Clinical Narrative: d/c home w/hospice-[facility] rep Randall Hiss aware. D/c summary faxed w/confirmation. Will call [facility] for transport home.      Final next level of care: Home w Hospice Care Barriers to Discharge: No Barriers Identified   Patient Goals and CMS Choice Patient states their goals for this hospitalization and ongoing recovery are:: To get home   Choice offered to / list presented to : Patient, Adult Children, Spouse  Discharge Placement                Patient to be transferred to facility by: [place] Name of family member notified: Buddy Duty son [phone] Patient and family notified of of transfer: 08/31/19  Discharge Plan and Services   Discharge Planning Services: CM Consult Post Acute Care Choice: Durable Medical Equipment, Hospice(home hospice[facility];private duty care-[facility])                    HH Arranged: RN [place] Agency: Other - See comment([facility]-Home Hospice;dme-w/c,rw,3n1,bedside table) Date HH Agency Contacted: 08/29/19 Time [place]: [place] Representative spoke with at [place]: [facility]  Social Determinants of Health ([place]) Interventions     Readmission Risk Interventions No flowsheet data found.

## 2019-09-04 ENCOUNTER — Inpatient Hospital Stay: Payer: Medicare Other | Admitting: Adult Health

## 2019-09-12 ENCOUNTER — Encounter (HOSPITAL_COMMUNITY): Payer: Self-pay | Admitting: Oncology

## 2019-09-19 NOTE — Progress Notes (Signed)
  Radiation Oncology         [phone] ________________________________  Name: CHARLAINE UTSEY MRN: [id number]  Date: 08/11/2019  DOB: November 03, 1950  SIMULATION AND TREATMENT PLANNING NOTE  DIAGNOSIS:     ICD-10-CM   1. 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  Identity was confirmed.  All relevant records and images related to the planned course of therapy were reviewed.   Written consent to proceed with treatment was confirmed which was freely given after reviewing the details related to the planned course of therapy had been reviewed with the patient.  Then, the patient was set-up in a stable reproducible  supine position for radiation therapy.  CT images were obtained.  Surface markings were placed.    Medically necessary complex treatment device(s) for immobilization:  Customized vac-lock bag.   The CT images were loaded into the planning software.  Then the target and avoidance structures were contoured.  Treatment planning then occurred.  The radiation prescription was entered and confirmed.  A total of 3 complex treatment devices were fabricated which relate to the designed radiation treatment fields. Additional reduced fields will be used as necessary to improve the dose homogeneity of the plan. Each of these customized fields/ complex treatment devices will be used on a daily basis during the radiation course. I have requested : 3D Simulation  I have requested a DVH of the following structures: target volume, spinal cord, lungs, heart.   The patient will undergo daily image guidance to ensure accurate localization of the target, and adequate minimize dose to the normal surrounding structures in close proximity to the target.   PLAN:  The patient will receive 30 Gy in 10 fractions.    ________________________________   Jodelle Gross, MD, PhD

## 2019-09-19 DEATH — deceased

## 2019-09-20 NOTE — Progress Notes (Signed)
  Radiation Oncology         [phone] ________________________________  Name: Jordan Clay MRN: [id number]  Date: 08/25/2019  DOB: Apr 14, 1951  End of Treatment Note  Diagnosis:   Lung cancer     Indication for treatment::  palliative       Radiation treatment dates:   08/14/19 - 08/25/19  Site/dose:   The patient was treated to the right lung to a dose of 30 Gy in 10 fractions using a 3-field, 3D conformal technique.  Narrative: The patient tolerated radiation treatment relatively well.     Plan: The patient has completed radiation treatment. The patient will return to radiation oncology clinic for routine followup in one month. I advised the patient to call or return sooner if they have any questions or concerns related to their recovery or treatment. ________________________________  Jodelle Gross, M.D., Ph.D.

## 2020-07-06 IMAGING — CT CT CHEST W/O CM
2 of 3 series · 14 of 36 positions shown, 17 images · non-contrast
Comparison: CT chest angiogram, 08/07/2019

CLINICAL DATA: Non-small cell lung cancer, evaluate PleurX catheter
placement and response to radiation

EXAM:
CT CHEST WITHOUT CONTRAST
TECHNIQUE: Multidetector CT imaging of the chest was performed following the
standard protocol without IV contrast.

[Series 2: thorax · axial · 0.67mm/px · z∈[+1330,+1584]mm · 11 of 151 slices shown, 14 images]
[im 12/151  mediastinal]
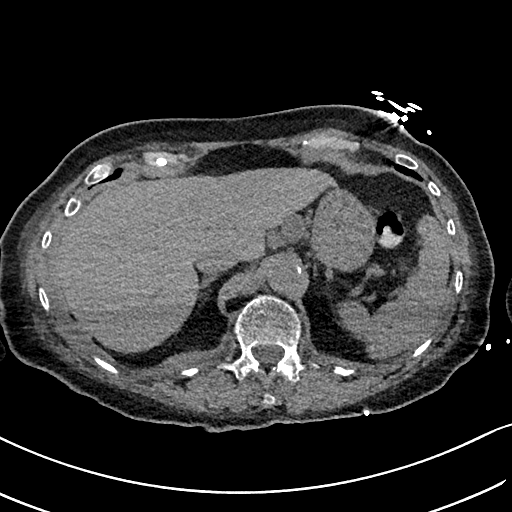
[im 12/151  lung]
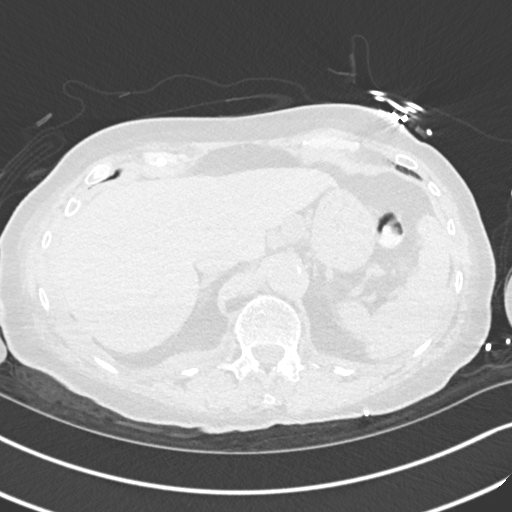
[im 23/151  lung]
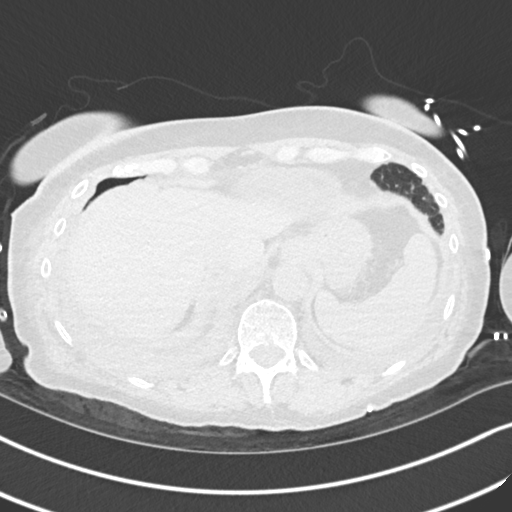
[im 34/151  lung]
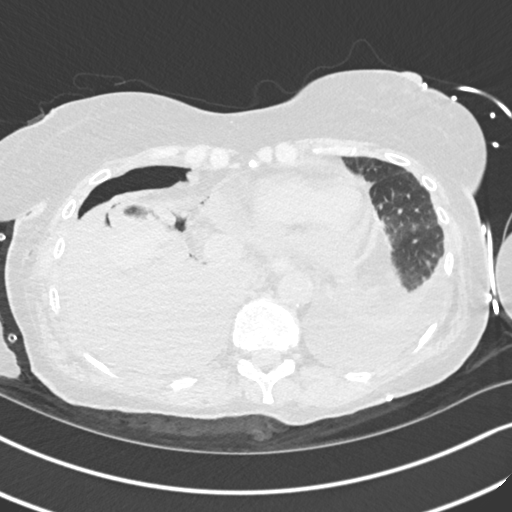
[im 51/151  lung]
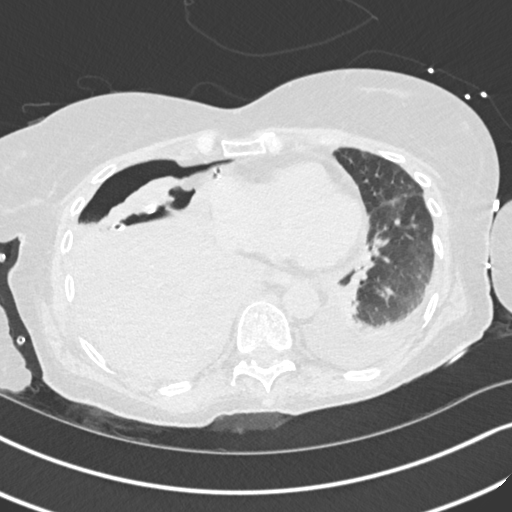
[im 62/151  mediastinal]
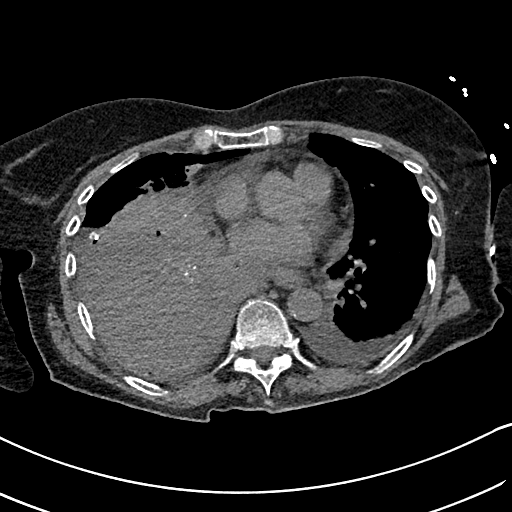
[im 62/151  lung]
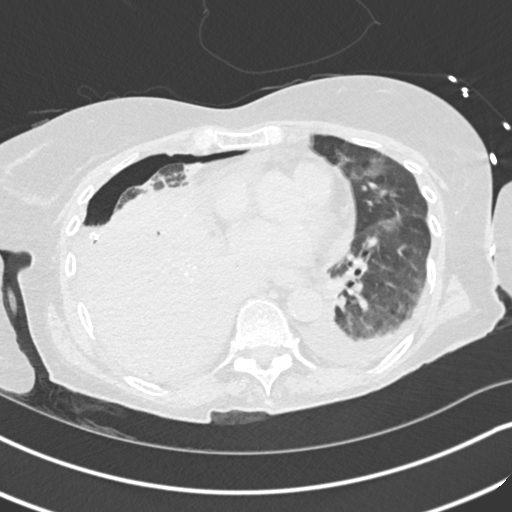
[im 78/151  lung]
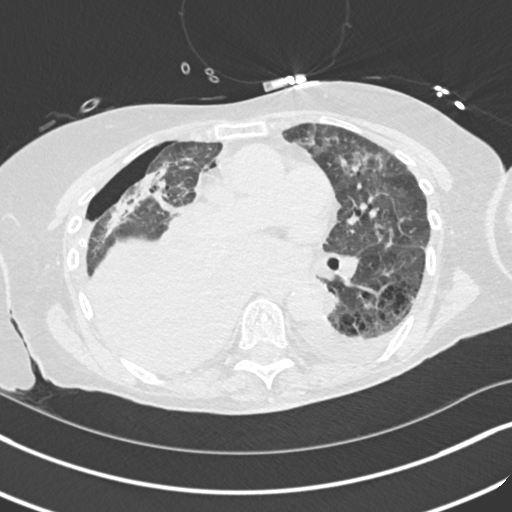
[im 89/151  lung]
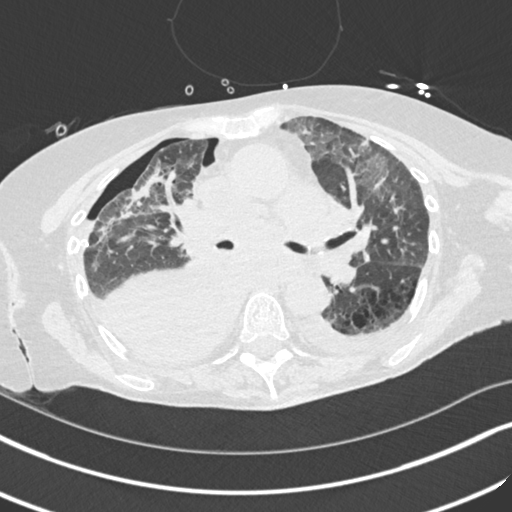
[im 101/151  lung]
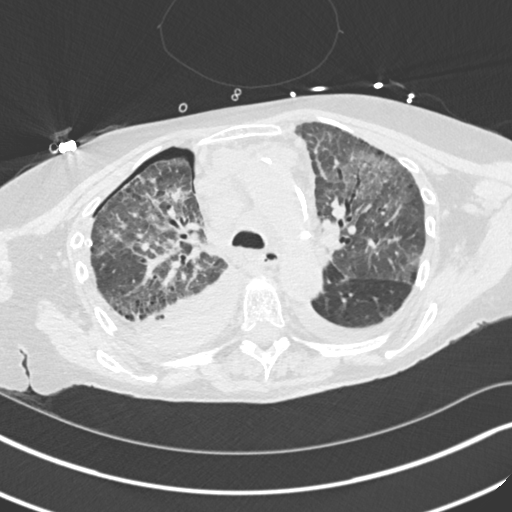
[im 117/151  mediastinal]
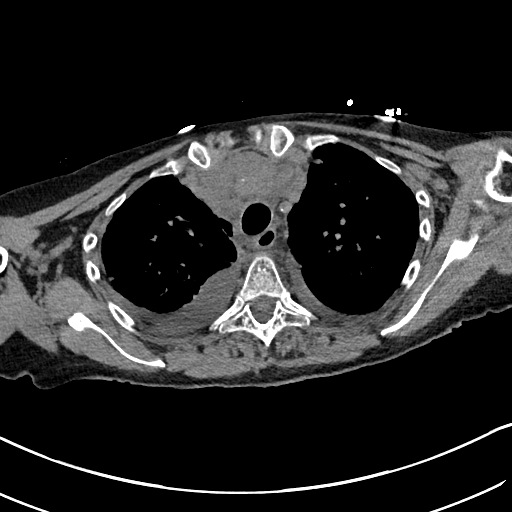
[im 117/151  lung]
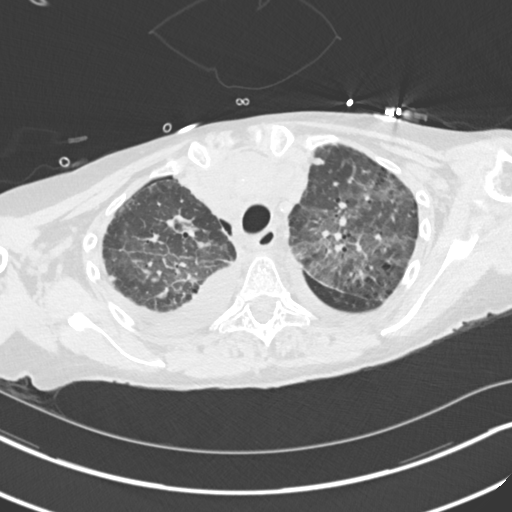
[im 128/151  lung]
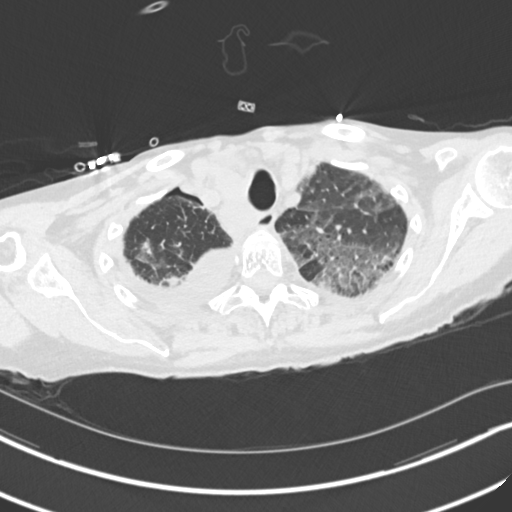
[im 139/151  lung]
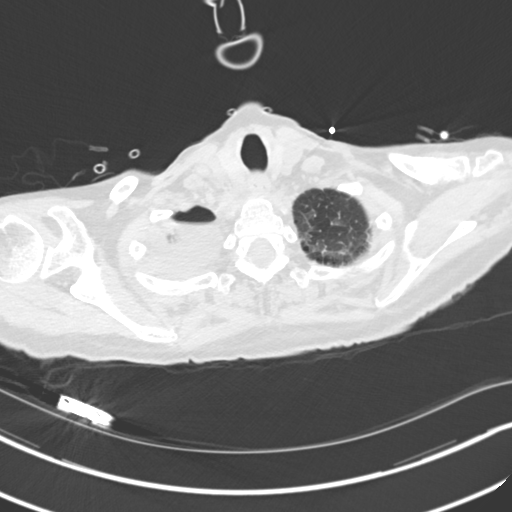

[Series 6: coronal · coronal · 0.59mm/px · 3 of 105 slices shown]
[im 21/105  lung]
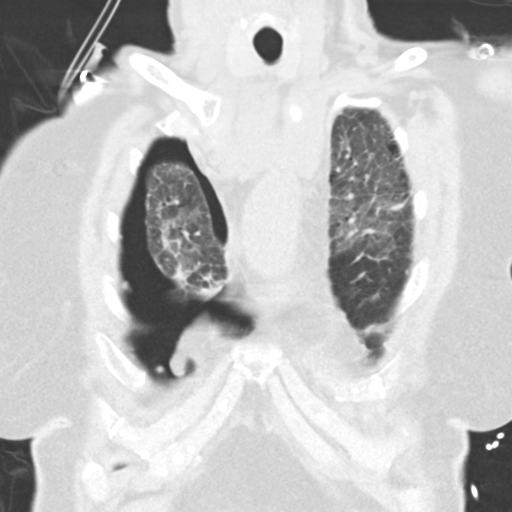
[im 42/105  lung]
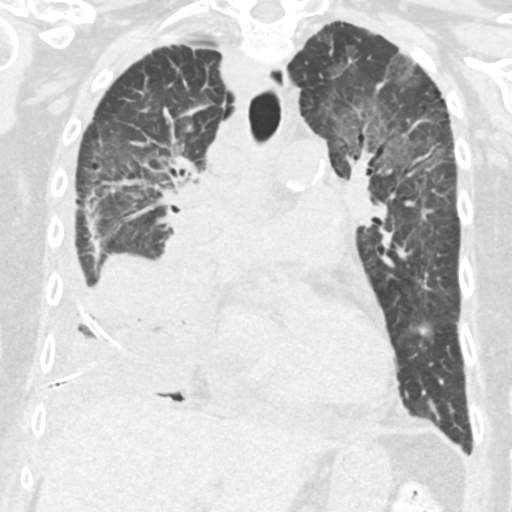
[im 63/105  lung]
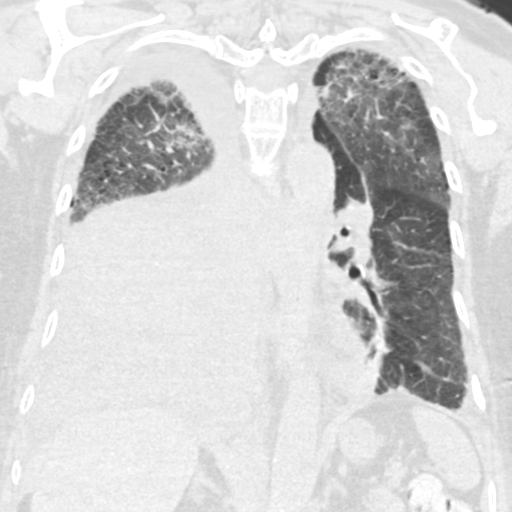

[14 of 36 positions shown; findings below may reference images not displayed]

FINDINGS: Cardiovascular: Aortic atherosclerosis. Normal heart size. Trace
pericardial effusion.

Mediastinum/Nodes: Very bulky superior mediastinal, mediastinal, and
right hilar lymphadenopathy is increased in bulk compared to prior
examination, the largest pretracheal lymph node that can be
discretely measured measures 3.3 x 3.0 cm, previously 3.1 x 2.5 cm
when measured similarly (series 2, image 55). There has particularly
been an increase in bulk of matted superior mediastinal lymph nodes
(series 2, image 23). Thyroid gland, trachea, and esophagus
demonstrate no significant findings.

Lungs/Pleura: There has been interval placement of a right-sided
tunneled pleural drainage catheter, which is looped around the
anterior pleural space, traversing the minor fissure and epicardial
fat lobulations, tip positioned about the superolateral right
pleural space. There is a moderate volume right hydropneumothorax
with a small right pneumothorax component and moderate pleural
effusion. There is a small left pleural effusion. Diffuse bilateral
interlobular septal thickening, ground-glass opacity and some
evidence of pulmonary nodularity, for example in the right pulmonary
apex new nodules measuring up to 1.0 cm (series 5, image 26). This
mild underlying emphysema. There has been increase in dense,
masslike postobstructive consolidation of the right lung, with total
consolidation of the right middle and right lower lobes. Perihilar
mass cannot be discerned from adjacent consolidated lung on
noncontrast examination.

Upper Abdomen: No acute abnormality. Enlarged gastrohepatic lymph
nodes in the upper abdomen, increased in size compared to prior
examination, measuring up to 2.0 x 1.6 cm, previously 1.7 x 1.1 cm
(series 2, image 140).

Musculoskeletal: No chest wall mass or suspicious bone lesions
identified.
IMPRESSION: 1. There has been interval placement of a right-sided tunneled
pleural drainage catheter, which is looped around the anterior
pleural space, traversing the minor fissure and epicardial fat
lobulations, tip positioned about the superolateral right pleural
space. There is a moderate volume right hydropneumothorax with a
small right pneumothorax component and moderate pleural effusion.
There is a small left pleural effusion.

2. There has been increase in dense, masslike postobstructive
consolidation of the right lung, with total consolidation of the
right middle and right lower lobes. Perihilar mass cannot be
discerned from adjacent consolidated lung on noncontrast
examination.

3. Diffuse bilateral interlobular septal thickening, ground-glass
opacity and some evidence of pulmonary nodularity, for example in
the right pulmonary apex new nodules measuring up to 1.0 cm (series
5, image 26). These findings may reflect a component of edema but
are very concerning for lymphangitic spread of metastatic disease.

4. Very bulky superior mediastinal, mediastinal, and right hilar
lymphadenopathy is increased in bulk compared to prior examination,
the largest pretracheal lymph node that can be discretely measured
measures 3.3 x 3.0 cm, previously 3.1 x 2.5 cm when measured
similarly (series 2, image 55). There has particularly been an
increase in bulk of matted superior mediastinal lymph nodes (series
2, image 23).

5. Enlarged gastrohepatic lymph nodes in the upper abdomen,
increased in size compared to prior examination, measuring up to
x 1.6 cm, previously 1.7 x 1.1 cm (series 2, image 140).

6.  Emphysema ([id number]).  Aortic Atherosclerosis ([id number]).
